# Patient Record
Sex: Male | Born: 1975
Health system: Southern US, Community
[De-identification: ages and names within clinical notes are randomized; demographics above are authoritative.]

## PROBLEM LIST (undated history)

## (undated) DIAGNOSIS — K760 Fatty (change of) liver, not elsewhere classified: Secondary | ICD-10-CM

## (undated) DIAGNOSIS — E785 Hyperlipidemia, unspecified: Secondary | ICD-10-CM

## (undated) DIAGNOSIS — G8929 Other chronic pain: Secondary | ICD-10-CM

## (undated) DIAGNOSIS — R7303 Prediabetes: Secondary | ICD-10-CM

## (undated) DIAGNOSIS — R079 Chest pain, unspecified: Secondary | ICD-10-CM

## (undated) DIAGNOSIS — M25569 Pain in unspecified knee: Secondary | ICD-10-CM

## (undated) DIAGNOSIS — M2550 Pain in unspecified joint: Secondary | ICD-10-CM

## (undated) DIAGNOSIS — R748 Abnormal levels of other serum enzymes: Secondary | ICD-10-CM

## (undated) DIAGNOSIS — M549 Dorsalgia, unspecified: Secondary | ICD-10-CM

## (undated) DIAGNOSIS — R0600 Dyspnea, unspecified: Secondary | ICD-10-CM

## (undated) DIAGNOSIS — I10 Essential (primary) hypertension: Secondary | ICD-10-CM

## (undated) DIAGNOSIS — F329 Major depressive disorder, single episode, unspecified: Secondary | ICD-10-CM

## (undated) DIAGNOSIS — M1990 Unspecified osteoarthritis, unspecified site: Secondary | ICD-10-CM

## (undated) DIAGNOSIS — F32A Depression, unspecified: Secondary | ICD-10-CM

## (undated) DIAGNOSIS — E559 Vitamin D deficiency, unspecified: Secondary | ICD-10-CM

## (undated) DIAGNOSIS — R600 Localized edema: Secondary | ICD-10-CM

## (undated) HISTORY — PX: KNEE ARTHROSCOPY: SUR90

## (undated) HISTORY — DX: Depression, unspecified: F32.A

## (undated) HISTORY — DX: Dorsalgia, unspecified: M54.9

## (undated) HISTORY — DX: Abnormal levels of other serum enzymes: R74.8

## (undated) HISTORY — DX: Pain in unspecified joint: M25.50

## (undated) HISTORY — DX: Localized edema: R60.0

## (undated) HISTORY — DX: Prediabetes: R73.03

## (undated) HISTORY — DX: Vitamin D deficiency, unspecified: E55.9

## (undated) HISTORY — DX: Dyspnea, unspecified: R06.00

## (undated) HISTORY — DX: Hyperlipidemia, unspecified: E78.5

## (undated) HISTORY — DX: Major depressive disorder, single episode, unspecified: F32.9

## (undated) HISTORY — DX: Essential (primary) hypertension: I10

## (undated) HISTORY — DX: Chest pain, unspecified: R07.9

## (undated) HISTORY — PX: VASECTOMY: SHX75

## (undated) HISTORY — DX: Pain in unspecified knee: M25.569

## (undated) HISTORY — DX: Other chronic pain: G89.29

## (undated) HISTORY — DX: Unspecified osteoarthritis, unspecified site: M19.90

## (undated) HISTORY — DX: Fatty (change of) liver, not elsewhere classified: K76.0

---

## 2013-07-25 ENCOUNTER — Emergency Department
Admission: EM | Admit: 2013-07-25 | Discharge: 2013-07-25 | Disposition: A | Discharge status: Home / Self Care | Payer: BC Managed Care – PPO | Source: Home / Self Care | Attending: Family Medicine | Admitting: Family Medicine

## 2013-07-25 ENCOUNTER — Encounter: Payer: Self-pay | Admitting: Emergency Medicine

## 2013-07-25 DIAGNOSIS — J209 Acute bronchitis, unspecified: Secondary | ICD-10-CM

## 2013-07-25 MED ORDER — PREDNISONE 20 MG PO TABS: 20.0000 mg | ORAL_TABLET | Freq: Two times a day (BID) | ORAL | Status: DC

## 2013-07-25 MED ORDER — AZITHROMYCIN 250 MG PO TABS: ORAL_TABLET | ORAL | Status: DC

## 2013-07-25 MED ORDER — BENZONATATE 200 MG PO CAPS: 200.0000 mg | ORAL_CAPSULE | Freq: Every day | ORAL | Status: DC

## 2013-07-25 NOTE — ED Notes (Signed)
Fever, chills, body aches, cough, congestion, sneezing x 3 days

## 2013-07-25 NOTE — Discharge Instructions (Signed)
Take plain Mucinex (1200 mg guaifenesin) twice daily for cough and congestion.  May add Sudafed for sinus congestion.   Increase fluid intake, rest. May use Afrin nasal spray (or generic oxymetazoline) twice daily for about 5 days.  Also recommend using saline nasal spray several times daily and saline nasal irrigation (AYR is a common brand) Try warm salt water gargles for sore throat.  Stop all antihistamines for now, and other non-prescription cough/cold preparations. May take Ibuprofen 200mg , 4 tabs every 8 hours with food for fever, body aches, etc.   Follow-up with family doctor if not improving 7 to 10 days.

## 2013-07-25 NOTE — ED Provider Notes (Signed)
CSN: [id number]     Arrival date & time 07/25/13  1100 History   First MD Initiated Contact with Patient 07/25/13 1211     Chief Complaint  Patient presents with  . URI      HPI Comments: 48 hours ago patient developed fatigue, sinus congestion, scratchy throat, and fever to 102 with chills.  Yesterday he had myalgias and developed a non-productive cough with wheezing.  His fever remained at 100.  He had one episode of diarrhea.  The history is provided by the patient.    History reviewed. No pertinent past medical history. Past Surgical History  Procedure Laterality Date  . Vasectomy     Family History  Problem Relation Age of Onset  . Stroke Father    History  Substance Use Topics  . Smoking status: Current Every Day Smoker -- 1.00 packs/day for 25 years    Types: Cigarettes  . Smokeless tobacco: Not on file  . Alcohol Use: Yes    Review of Systems ? sore throat + cough No pleuritic pain + wheezing + nasal congestion + post-nasal drainage No sinus pain/pressure No itchy/red eyes No earache No hemoptysis + SOB + fever, + chills No nausea No vomiting No abdominal pain +diarrhea, resolved No urinary symptoms No skin rash + fatigue + myalgias + headache Used OTC meds without relief  Allergies  Penicillins  Home Medications   Current Outpatient Rx  Name  Route  Sig  Dispense  Refill  . guaiFENesin (MUCINEX) 600 MG 12 hr tablet   Oral   Take by mouth 2 (two) times daily.         . Pseudoeph-Doxylamine-DM-APAP (NYQUIL PO)   Oral   Take by mouth.         Marland Kitchen. azithromycin (ZITHROMAX Z-PAK) 250 MG tablet      Take 2 tabs today; then begin one tab once daily for 4 more days.   6 each   0   . benzonatate (TESSALON) 200 MG capsule   Oral   Take 1 capsule (200 mg total) by mouth at bedtime. Take as needed for cough   12 capsule   0   . predniSONE (DELTASONE) 20 MG tablet   Oral   Take 1 tablet (20 mg total) by mouth 2 (two) times daily. Take with  food.   10 tablet   0    BP 115/78  Pulse 84  Temp(Src) 98.3 F (36.8 C) (Oral)  Ht 5\' 8"  (1.727 m)  Wt 274 lb (124.286 kg)  BMI 41.67 kg/m2  SpO2 96% Physical Exam Nursing notes and Vital Signs reviewed. Appearance:  Patient appears stated age, and in no acute distress.  Patient is obese (BMI 41.7) Eyes:  Pupils are equal, round, and reactive to light and accomodation.  Extraocular movement is intact.  Conjunctivae are not inflamed  Ears:  Canals normal.  Tympanic membranes normal.  Nose:  Congested turbinates.  No sinus tenderness.    Pharynx:  Normal Neck:  Supple.  Tender enlarged posterior nodes are palpated bilaterally  Lungs:  Bilateral faint wheezes present.  Breath sounds are equal.  Heart:  Regular rate and rhythm without murmurs, rubs, or gallops.  Abdomen:  Nontender without masses or hepatosplenomegaly.  Bowel sounds are present.  No CVA or flank tenderness.  Extremities:  No edema.  No calf tenderness Skin:  No rash present.   ED Course  Procedures  none      MDM   1. Acute bronchitis  Begin prednisone burst and Z-pack.  Prescription written for Benzonatate [facility]) to take at bedtime for night-time cough.  Take plain Mucinex (1200 mg guaifenesin) twice daily for cough and congestion.  May add Sudafed for sinus congestion.   Increase fluid intake, rest. May use Afrin nasal spray (or generic oxymetazoline) twice daily for about 5 days.  Also recommend using saline nasal spray several times daily and saline nasal irrigation (AYR is a common brand) Try warm salt water gargles for sore throat.  Stop all antihistamines for now, and other non-prescription cough/cold preparations. May take Ibuprofen 200mg , 4 tabs every 8 hours with food for fever, body aches, etc.   Follow-up with family doctor if not improving 7 to 10 days.     Lattie Haw, MD 07/26/13 [phone]

## 2016-02-11 ENCOUNTER — Telehealth: Payer: Self-pay | Admitting: Family Medicine

## 2016-02-11 NOTE — Telephone Encounter (Signed)
Can see him as acute

## 2016-02-11 NOTE — Telephone Encounter (Signed)
Please advise      KP 

## 2016-02-11 NOTE — Telephone Encounter (Signed)
Caller name: Chrissie NoaWilliam Relationship to patient: self Can be reached: [phone]  Reason for call: Pt wife is current pt with Dr. Laury AxonLowne (April Earlene Plateravis 04/07/72). Pt not feeling well, face red, has BP issues, pt has severe arthritis. Used to go to the [facility]. Pt wanting to know if he can be seen ASAP if accepted as pt. Please advise.

## 2016-02-14 ENCOUNTER — Ambulatory Visit (INDEPENDENT_AMBULATORY_CARE_PROVIDER_SITE_OTHER): Payer: BLUE CROSS/BLUE SHIELD | Admitting: Family Medicine

## 2016-02-14 ENCOUNTER — Encounter: Payer: Self-pay | Admitting: Family Medicine

## 2016-02-14 VITALS — BP 142/88 | HR 78 | Temp 98.6°F | Resp 16 | Ht 68.0 in | Wt 276.6 lb

## 2016-02-14 DIAGNOSIS — R079 Chest pain, unspecified: Secondary | ICD-10-CM | POA: Diagnosis not present

## 2016-02-14 DIAGNOSIS — I10 Essential (primary) hypertension: Secondary | ICD-10-CM

## 2016-02-14 DIAGNOSIS — M25561 Pain in right knee: Secondary | ICD-10-CM

## 2016-02-14 DIAGNOSIS — M25562 Pain in left knee: Secondary | ICD-10-CM

## 2016-02-14 DIAGNOSIS — G8929 Other chronic pain: Secondary | ICD-10-CM

## 2016-02-14 MED ORDER — LISINOPRIL 10 MG PO TABS: 10.0000 mg | ORAL_TABLET | Freq: Every day | ORAL | 2 refills | Status: DC

## 2016-02-14 MED ORDER — HYDROCODONE-ACETAMINOPHEN 5-325 MG PO TABS: 1.0000 | ORAL_TABLET | Freq: Four times a day (QID) | ORAL | 0 refills | Status: DC | PRN

## 2016-02-14 NOTE — Progress Notes (Signed)
Pre visit review using our clinic review tool, if applicable. No additional management support is needed unless otherwise documented below in the visit note. 

## 2016-02-14 NOTE — Patient Instructions (Signed)
DASH Eating Plan  DASH stands for "Dietary Approaches to Stop Hypertension." The DASH eating plan is a healthy eating plan that has been shown to reduce high blood pressure (hypertension). Additional health benefits may include reducing the risk of type 2 diabetes mellitus, heart disease, and stroke. The DASH eating plan may also help with weight loss.  WHAT DO I NEED TO KNOW ABOUT THE DASH EATING PLAN?  For the DASH eating plan, you will follow these general guidelines:  · Choose foods with a percent daily value for sodium of less than 5% (as listed on the food label).  · Use salt-free seasonings or herbs instead of table salt or sea salt.  · Check with your health care provider or pharmacist before using salt substitutes.  · Eat lower-sodium products, often labeled as "lower sodium" or "no salt added."  · Eat fresh foods.  · Eat more vegetables, fruits, and low-fat dairy products.  · Choose whole grains. Look for the word "whole" as the first word in the ingredient list.  · Choose fish and skinless chicken or turkey more often than red meat. Limit fish, poultry, and meat to 6 oz (170 g) each day.  · Limit sweets, desserts, sugars, and sugary drinks.  · Choose heart-healthy fats.  · Limit cheese to 1 oz (28 g) per day.  · Eat more home-cooked food and less restaurant, buffet, and fast food.  · Limit fried foods.  · Cook foods using methods other than frying.  · Limit canned vegetables. If you do use them, rinse them well to decrease the sodium.  · When eating at a restaurant, ask that your food be prepared with less salt, or no salt if possible.  WHAT FOODS CAN I EAT?  Seek help from a dietitian for individual calorie needs.  Grains  Whole grain or whole wheat bread. Brown rice. Whole grain or whole wheat pasta. Quinoa, bulgur, and whole grain cereals. Low-sodium cereals. Corn or whole wheat flour tortillas. Whole grain cornbread. Whole grain crackers. Low-sodium crackers.  Vegetables  Fresh or frozen vegetables  (raw, steamed, roasted, or grilled). Low-sodium or reduced-sodium tomato and vegetable juices. Low-sodium or reduced-sodium tomato sauce and paste. Low-sodium or reduced-sodium canned vegetables.   Fruits  All fresh, canned (in natural juice), or frozen fruits.  Meat and Other Protein Products  Ground beef (85% or leaner), grass-fed beef, or beef trimmed of fat. Skinless chicken or turkey. Ground chicken or turkey. Pork trimmed of fat. All fish and seafood. Eggs. Dried beans, peas, or lentils. Unsalted nuts and seeds. Unsalted canned beans.  Dairy  Low-fat dairy products, such as skim or 1% milk, 2% or reduced-fat cheeses, low-fat ricotta or cottage cheese, or plain low-fat yogurt. Low-sodium or reduced-sodium cheeses.  Fats and Oils  Tub margarines without trans fats. Light or reduced-fat mayonnaise and salad dressings (reduced sodium). Avocado. Safflower, olive, or canola oils. Natural peanut or almond butter.  Other  Unsalted popcorn and pretzels.  The items listed above may not be a complete list of recommended foods or beverages. Contact your dietitian for more options.  WHAT FOODS ARE NOT RECOMMENDED?  Grains  White bread. White pasta. White rice. Refined cornbread. Bagels and croissants. Crackers that contain trans fat.  Vegetables  Creamed or fried vegetables. Vegetables in a cheese sauce. Regular canned vegetables. Regular canned tomato sauce and paste. Regular tomato and vegetable juices.  Fruits  Dried fruits. Canned fruit in light or heavy syrup. Fruit juice.  Meat and Other Protein   Products  Fatty cuts of meat. Ribs, chicken wings, bacon, sausage, bologna, salami, chitterlings, fatback, hot dogs, bratwurst, and packaged luncheon meats. Salted nuts and seeds. Canned beans with salt.  Dairy  Whole or 2% milk, cream, half-and-half, and cream cheese. Whole-fat or sweetened yogurt. Full-fat cheeses or blue cheese. Nondairy creamers and whipped toppings. Processed cheese, cheese spreads, or cheese  curds.  Condiments  Onion and garlic salt, seasoned salt, table salt, and sea salt. Canned and packaged gravies. Worcestershire sauce. Tartar sauce. Barbecue sauce. Teriyaki sauce. Soy sauce, including reduced sodium. Steak sauce. Fish sauce. Oyster sauce. Cocktail sauce. Horseradish. Ketchup and mustard. Meat flavorings and tenderizers. Bouillon cubes. Hot sauce. Tabasco sauce. Marinades. Taco seasonings. Relishes.  Fats and Oils  Butter, stick margarine, lard, shortening, ghee, and bacon fat. Coconut, palm kernel, or palm oils. Regular salad dressings.  Other  Pickles and olives. Salted popcorn and pretzels.  The items listed above may not be a complete list of foods and beverages to avoid. Contact your dietitian for more information.  WHERE CAN I FIND MORE INFORMATION?  National Heart, Lung, and [employer] Institute: www.nhlbi.nih.gov/health/health-topics/topics/dash/     This information is not intended to replace advice given to you by your health care provider. Make sure you discuss any questions you have with your health care provider.     Document Released: 03/27/2011 Document Revised: 04/28/2014 Document Reviewed: 02/09/2013  Elsevier Interactive Patient Education ©2016 [employer]vier Inc[employer]

## 2016-02-14 NOTE — Progress Notes (Signed)
Patient ID: Jonathan BodoWilliam Muston, male    DOB: December 12, 1975  Age: 40 y.o. MRN: [id number]    Subjective:  Subjective  HPI Jonathan Hansen presents to establish and c/o bp and b/l knee pain.   Pt had surgery R knee Nov 2015 ortho [facility].   Pt still has aching-- and L knee is the worse one.  He can not stand for more than a few minutes in a store.  Pt had been taking hydrocodone for pain.    Pt bp has had borderline high for years and never been treated.    Review of Systems  Constitutional: Negative for appetite change, diaphoresis, fatigue and unexpected weight change.  Eyes: Negative for pain, redness and visual disturbance.  Respiratory: Negative for cough, chest tightness, shortness of breath and wheezing.   Cardiovascular: Negative for chest pain, palpitations and leg swelling.  Endocrine: Negative for cold intolerance, heat intolerance, polydipsia, polyphagia and polyuria.  Genitourinary: Negative for difficulty urinating, dysuria and frequency.  Musculoskeletal: Positive for arthralgias and joint swelling.  Neurological: Positive for headaches. Negative for dizziness, light-headedness and numbness.    History Past Medical History:  Diagnosis Date  . HTN (hypertension)     He has a past surgical history that includes Vasectomy.   His family history includes Coronary artery disease in his father; Stroke in his father.He reports that he has been smoking Cigarettes.  He has a 25.00 pack-year smoking history. He does not have any smokeless tobacco history on file. He reports that he drinks alcohol. His drug history is not on file.  No current outpatient prescriptions on file prior to visit.   No current facility-administered medications on file prior to visit.      Objective:  Objective  Physical Exam  Constitutional: He is oriented to person, place, and time. Vital signs are normal. He appears well-developed and well-nourished. He is sleeping.  HENT:  Head: Normocephalic and  atraumatic.  Mouth/Throat: Oropharynx is clear and moist.  Eyes: EOM are normal. Pupils are equal, round, and reactive to light.  Neck: Normal range of motion. Neck supple. No thyromegaly present.  Cardiovascular: Normal rate and regular rhythm.   No murmur heard. Pulmonary/Chest: Effort normal and breath sounds normal. No respiratory distress. He has no wheezes. He has no rales. He exhibits no tenderness.  Musculoskeletal: He exhibits no edema or tenderness.  Neurological: He is alert and oriented to person, place, and time.  Skin: Skin is warm and dry.  Psychiatric: He has a normal mood and affect. His behavior is normal. Judgment and thought content normal.  Nursing note and vitals reviewed.  BP (!) 142/88 (BP Location: Left Arm, Patient Position: Sitting, Cuff Size: Large)   Pulse 78   Temp 98.6 F (37 C) (Oral)   Resp 16   Ht 5\' 8"  (1.727 m)   Wt 276 lb 9.6 oz (125.5 kg)   SpO2 97%   BMI 42.06 kg/m  Wt Readings from Last 3 Encounters:  02/14/16 276 lb 9.6 oz (125.5 kg)  07/25/13 274 lb (124.3 kg)     No results found for: WBC, HGB, HCT, PLT, GLUCOSE, CHOL, TRIG, HDL, LDLDIRECT, LDLCALC, ALT, AST, NA, K, CL, CREATININE, BUN, CO2, TSH, PSA, INR, GLUF, HGBA1C, MICROALBUR  No results found.   Assessment & Plan:  Plan  I have discontinued Mr. Thayer JewBurdette's Pseudoeph-Doxylamine-DM-APAP (NYQUIL PO), guaiFENesin, predniSONE, azithromycin, and benzonatate. I am also having him start on lisinopril and HYDROcodone-acetaminophen.  Meds ordered this encounter  Medications  . lisinopril (PRINIVIL,ZESTRIL)  10 MG tablet    Sig: Take 1 tablet (10 mg total) by mouth daily.    Dispense:  30 tablet    Refill:  2  . HYDROcodone-acetaminophen (NORCO/VICODIN) 5-325 MG tablet    Sig: Take 1 tablet by mouth every 6 (six) hours as needed for moderate pain.    Dispense:  30 tablet    Refill:  0    Problem List Items Addressed This Visit      Unprioritized   Chest pain   Relevant Orders    EKG 12-Lead (Completed)   ECHOCARDIOGRAM COMPLETE   Chronic pain of both knees   Relevant Medications   HYDROcodone-acetaminophen (NORCO/VICODIN) 5-325 MG tablet   Other Relevant Orders   Ambulatory referral to Orthopedic Surgery   Essential hypertension - Primary   Relevant Medications   lisinopril (PRINIVIL,ZESTRIL) 10 MG tablet   Other Relevant Orders   EKG 12-Lead (Completed)    Other Visit Diagnoses   None.   if chest pain returns-- go to er  Follow-up: Return in about 2 weeks (around 02/28/2016) for hypertension.  Donato Schultz, DO

## 2016-02-17 DIAGNOSIS — I10 Essential (primary) hypertension: Secondary | ICD-10-CM | POA: Insufficient documentation

## 2016-02-17 DIAGNOSIS — M25561 Pain in right knee: Secondary | ICD-10-CM

## 2016-02-17 DIAGNOSIS — M25562 Pain in left knee: Secondary | ICD-10-CM

## 2016-02-17 DIAGNOSIS — R079 Chest pain, unspecified: Secondary | ICD-10-CM | POA: Insufficient documentation

## 2016-02-17 DIAGNOSIS — G8929 Other chronic pain: Secondary | ICD-10-CM | POA: Insufficient documentation

## 2016-03-04 ENCOUNTER — Ambulatory Visit (INDEPENDENT_AMBULATORY_CARE_PROVIDER_SITE_OTHER): Payer: BLUE CROSS/BLUE SHIELD | Admitting: Family Medicine

## 2016-03-04 ENCOUNTER — Encounter: Payer: Self-pay | Admitting: Family Medicine

## 2016-03-04 VITALS — BP 120/82 | HR 75 | Temp 98.0°F | Resp 16 | Ht 68.0 in | Wt 273.0 lb

## 2016-03-04 DIAGNOSIS — I10 Essential (primary) hypertension: Secondary | ICD-10-CM

## 2016-03-04 DIAGNOSIS — G8929 Other chronic pain: Secondary | ICD-10-CM

## 2016-03-04 DIAGNOSIS — M25562 Pain in left knee: Secondary | ICD-10-CM

## 2016-03-04 DIAGNOSIS — R0789 Other chest pain: Secondary | ICD-10-CM | POA: Diagnosis not present

## 2016-03-04 DIAGNOSIS — M25561 Pain in right knee: Secondary | ICD-10-CM | POA: Diagnosis not present

## 2016-03-04 MED ORDER — HYDROCODONE-ACETAMINOPHEN 5-325 MG PO TABS: 1.0000 | ORAL_TABLET | Freq: Four times a day (QID) | ORAL | 0 refills | Status: DC | PRN

## 2016-03-04 MED ORDER — LISINOPRIL 10 MG PO TABS: 10.0000 mg | ORAL_TABLET | Freq: Every day | ORAL | 1 refills | Status: DC

## 2016-03-04 NOTE — Progress Notes (Signed)
Pre visit review using our clinic review tool, if applicable. No additional management support is needed unless otherwise documented below in the visit note. 

## 2016-03-04 NOTE — Progress Notes (Signed)
Patient ID: Jonathan Hansen, male    DOB: [birth year]  Age: 40 y.o. MRN: [id number]    Subjective:  Subjective  HPI Jonathan Hansen presents for bp f/u.  Doing well but still having same chest pinching feeling ---  No sob, no palpatations.    Review of Systems  Constitutional: Negative for appetite change, diaphoresis, fatigue and unexpected weight change.  Eyes: Negative for pain, redness and visual disturbance.  Respiratory: Negative for cough, chest tightness, shortness of breath and wheezing.   Cardiovascular: Positive for chest pain. Negative for palpitations and leg swelling.  Endocrine: Negative for cold intolerance, heat intolerance, polydipsia, polyphagia and polyuria.  Genitourinary: Negative for difficulty urinating, dysuria and frequency.  Neurological: Negative for dizziness, light-headedness, numbness and headaches.    History Past Medical History:  Diagnosis Date  . HTN (hypertension)     He has a past surgical history that includes Vasectomy.   His family history includes Coronary artery disease in his father; Stroke in his father.He reports that he has been smoking Cigarettes.  He has a 25.00 pack-year smoking history. He does not have any smokeless tobacco history on file. He reports that he drinks alcohol. His drug history is not on file.  No current outpatient prescriptions on file prior to visit.   No current facility-administered medications on file prior to visit.      Objective:  Objective  Physical Exam  Constitutional: He is oriented to person, place, and time. Vital signs are normal. He appears well-developed and well-nourished. He is sleeping.  HENT:  Head: Normocephalic and atraumatic.  Mouth/Throat: Oropharynx is clear and moist.  Eyes: EOM are normal. Pupils are equal, round, and reactive to light.  Neck: Normal range of motion. Neck supple. No thyromegaly present.  Cardiovascular: Normal rate and regular rhythm.   No murmur  heard. Pulmonary/Chest: Effort normal and breath sounds normal. No respiratory distress. He has no wheezes. He has no rales. He exhibits no tenderness.  Musculoskeletal: He exhibits no edema or tenderness.  Neurological: He is alert and oriented to person, place, and time.  Skin: Skin is warm and dry.  Psychiatric: He has a normal mood and affect. His behavior is normal. Judgment and thought content normal.  Nursing note and vitals reviewed.  BP 120/82 (BP Location: Right Arm, Patient Position: Sitting, Cuff Size: Large)   Pulse 75   Temp 98 F (36.7 C) (Oral)   Resp 16   Ht 5\' 8"  (1.727 m)   Wt 273 lb (123.8 kg)   SpO2 97%   BMI 41.51 kg/m  Wt Readings from Last 3 Encounters:  03/04/16 273 lb (123.8 kg)  02/14/16 276 lb 9.6 oz (125.5 kg)  07/25/13 274 lb (124.3 kg)     No results found for: WBC, HGB, HCT, PLT, GLUCOSE, CHOL, TRIG, HDL, LDLDIRECT, LDLCALC, ALT, AST, NA, K, CL, CREATININE, BUN, CO2, TSH, PSA, INR, GLUF, HGBA1C, MICROALBUR  No results found.   Assessment & Plan:  Plan  I am having Jonathan Hansen maintain his lisinopril and HYDROcodone-acetaminophen.  Meds ordered this encounter  Medications  . lisinopril (PRINIVIL,ZESTRIL) 10 MG tablet    Sig: Take 1 tablet (10 mg total) by mouth daily.    Dispense:  90 tablet    Refill:  1  . HYDROcodone-acetaminophen (NORCO/VICODIN) 5-325 MG tablet    Sig: Take 1 tablet by mouth every 6 (six) hours as needed for moderate pain.    Dispense:  30 tablet    Refill:  0  Problem List Items Addressed This Visit      Unprioritized   Chronic pain of both knees   Relevant Medications   HYDROcodone-acetaminophen (NORCO/VICODIN) 5-325 MG tablet   Chest pain - Primary    Comes and goes Echo pending Stress test Pt admits to being under stress If pain worsens ---go to ER       Relevant Orders   Myocardial Perfusion Imaging   Essential hypertension    Stable con't lisinopril      Relevant Medications   lisinopril  (PRINIVIL,ZESTRIL) 10 MG tablet      Follow-up: No Follow-up on file.  Donato SchultzYvonne R Lowne Chase, DO

## 2016-03-04 NOTE — Patient Instructions (Signed)

## 2016-03-05 ENCOUNTER — Telehealth: Payer: Self-pay | Admitting: General Practice

## 2016-03-05 ENCOUNTER — Ambulatory Visit (HOSPITAL_BASED_OUTPATIENT_CLINIC_OR_DEPARTMENT_OTHER)
Admission: RE | Admit: 2016-03-05 | Discharge: 2016-03-05 | Disposition: A | Discharge status: Home / Self Care | Payer: BLUE CROSS/BLUE SHIELD | Source: Ambulatory Visit | Attending: Family Medicine | Admitting: Family Medicine

## 2016-03-05 DIAGNOSIS — I517 Cardiomegaly: Secondary | ICD-10-CM | POA: Diagnosis not present

## 2016-03-05 DIAGNOSIS — R079 Chest pain, unspecified: Secondary | ICD-10-CM | POA: Insufficient documentation

## 2016-03-05 NOTE — Assessment & Plan Note (Signed)
Comes and goes Echo pending Stress test Pt admits to being under stress If pain worsens ---go to ER

## 2016-03-05 NOTE — Progress Notes (Signed)
  Echocardiogram 2D Echocardiogram has been performed.  Nolon RodBrown, Tony 03/05/2016, 10:48 AM

## 2016-03-05 NOTE — Telephone Encounter (Signed)
Patient called in for the results of his Echocardiogram results. Gave them to him. Patient says please let him know if there was anything else he needs to do.

## 2016-03-05 NOTE — Telephone Encounter (Signed)
Stress test was ordered by PCP and pre-certed by referral coordinators. Order was just placed yesterday evening. The pt should receive a call from scheduling department to schedule echo.

## 2016-03-05 NOTE — Telephone Encounter (Signed)
Pt says that at his last appt he was told that he would need a stress test. Pt says that he never heard any more about it. Please advise.

## 2016-03-05 NOTE — Assessment & Plan Note (Signed)
Stable con't lisinopril  

## 2016-03-06 ENCOUNTER — Encounter: Payer: Self-pay | Admitting: Family Medicine

## 2016-03-25 ENCOUNTER — Ambulatory Visit (INDEPENDENT_AMBULATORY_CARE_PROVIDER_SITE_OTHER): Payer: PRIVATE HEALTH INSURANCE | Admitting: Family Medicine

## 2016-03-25 ENCOUNTER — Encounter: Payer: Self-pay | Admitting: Family Medicine

## 2016-03-25 VITALS — BP 130/88 | HR 79 | Temp 98.2°F | Resp 17 | Ht 68.0 in | Wt 274.6 lb

## 2016-03-25 DIAGNOSIS — M25562 Pain in left knee: Secondary | ICD-10-CM

## 2016-03-25 DIAGNOSIS — R0789 Other chest pain: Secondary | ICD-10-CM | POA: Diagnosis not present

## 2016-03-25 DIAGNOSIS — Z8249 Family history of ischemic heart disease and other diseases of the circulatory system: Secondary | ICD-10-CM | POA: Diagnosis not present

## 2016-03-25 DIAGNOSIS — G8929 Other chronic pain: Secondary | ICD-10-CM

## 2016-03-25 DIAGNOSIS — I10 Essential (primary) hypertension: Secondary | ICD-10-CM

## 2016-03-25 DIAGNOSIS — M25561 Pain in right knee: Secondary | ICD-10-CM

## 2016-03-25 MED ORDER — HYDROCODONE-ACETAMINOPHEN 5-325 MG PO TABS: 1.0000 | ORAL_TABLET | Freq: Four times a day (QID) | ORAL | 0 refills | Status: DC | PRN

## 2016-03-25 MED ORDER — LISINOPRIL 20 MG PO TABS: 20.0000 mg | ORAL_TABLET | Freq: Every day | ORAL | 3 refills | Status: DC

## 2016-03-25 NOTE — Progress Notes (Signed)
Subjective:    Patient ID: Jonathan Hansen, male    DOB: 12-13-1975, 40 y.o.   MRN: [id number]  Chief Complaint  Patient presents with  . Hypertension    follow up    HPI Patient is in today for f/u bp.  Pt still c/o knee/ joint pain.  Ortho pending.    Past Medical History:  Diagnosis Date  . HTN (hypertension)     Past Surgical History:  Procedure Laterality Date  . VASECTOMY      Family History  Problem Relation Age of Onset  . Stroke Father   . Coronary artery disease Father     carotid stenosis    Social History   Social History  . Marital status: Married    Spouse name: N/A  . Number of children: N/A  . Years of education: N/A   Occupational History  . Not on file.   Social History Main Topics  . Smoking status: Current Every Day Smoker    Packs/day: 1.00    Years: 25.00    Types: Cigarettes  . Smokeless tobacco: Not on file  . Alcohol use Yes  . Drug use: Unknown  . Sexual activity: Not on file   Other Topics Concern  . Not on file   Social History Narrative  . No narrative on file    Outpatient Medications Prior to Visit  Medication Sig Dispense Refill  . HYDROcodone-acetaminophen (NORCO/VICODIN) 5-325 MG tablet Take 1 tablet by mouth every 6 (six) hours as needed for moderate pain. 30 tablet 0  . lisinopril (PRINIVIL,ZESTRIL) 10 MG tablet Take 1 tablet (10 mg total) by mouth daily. 90 tablet 1   No facility-administered medications prior to visit.     Allergies  Allergen Reactions  . Penicillins     Review of Systems  Constitutional: Negative for chills, fever and malaise/fatigue.  HENT: Negative for congestion and hearing loss.   Eyes: Negative for discharge.  Respiratory: Negative for cough, sputum production and shortness of breath.   Cardiovascular: Negative for chest pain, palpitations and leg swelling.  Gastrointestinal: Negative for abdominal pain, blood in stool, constipation, diarrhea, heartburn, nausea and vomiting.    Genitourinary: Negative for dysuria, frequency, hematuria and urgency.  Musculoskeletal: Positive for back pain and joint pain. Negative for falls and myalgias.  Skin: Negative for rash.  Neurological: Negative for dizziness, sensory change, loss of consciousness, weakness and headaches.  Endo/Heme/Allergies: Negative for environmental allergies. Does not bruise/bleed easily.  Psychiatric/Behavioral: Negative for depression and suicidal ideas. The patient is not nervous/anxious and does not have insomnia.        Objective:    Physical Exam  Constitutional: He is oriented to person, place, and time. Vital signs are normal. He appears well-developed and well-nourished. He is sleeping.  HENT:  Head: Normocephalic and atraumatic.  Mouth/Throat: Oropharynx is clear and moist.  Eyes: EOM are normal. Pupils are equal, round, and reactive to light.  Neck: Normal range of motion. Neck supple. No thyromegaly present.  Cardiovascular: Normal rate and regular rhythm.   No murmur heard. Pulmonary/Chest: Effort normal and breath sounds normal. No respiratory distress. He has no wheezes. He has no rales. He exhibits no tenderness.  Musculoskeletal: He exhibits no edema or tenderness.  Neurological: He is alert and oriented to person, place, and time.  Skin: Skin is warm and dry.  Psychiatric: He has a normal mood and affect. His behavior is normal. Judgment and thought content normal.  Nursing note and vitals reviewed.  BP 130/88 (BP Location: Right Arm, Patient Position: Sitting, Cuff Size: Large)   Pulse 79   Temp 98.2 F (36.8 C) (Oral)   Resp 17   Ht 5' 8" (1.727 m)   Wt 274 lb 9.6 oz (124.6 kg)   SpO2 100%   BMI 41.75 kg/m  Wt Readings from Last 3 Encounters:  03/25/16 274 lb 9.6 oz (124.6 kg)  03/04/16 273 lb (123.8 kg)  02/14/16 276 lb 9.6 oz (125.5 kg)     No results found for: WBC, HGB, HCT, PLT, GLUCOSE, CHOL, TRIG, HDL, LDLDIRECT, LDLCALC, ALT, AST, NA, K, CL, CREATININE,  BUN, CO2, TSH, PSA, INR, GLUF, HGBA1C, MICROALBUR  No results found for: TSH No results found for: WBC, HGB, HCT, MCV, PLT No results found for: NA, K, CHLORIDE, CO2, GLUCOSE, BUN, CREATININE, BILITOT, ALKPHOS, AST, ALT, PROT, ALBUMIN, CALCIUM, ANIONGAP, EGFR, GFR No results found for: CHOL No results found for: HDL No results found for: LDLCALC No results found for: TRIG No results found for: CHOLHDL No results found for: HGBA1C     Assessment & Plan:   Problem List Items Addressed This Visit      Unprioritized   Chest pain    Improved since bp has come down Stress test pending      Chronic pain of both knees    Refill pain meds F/u ortho      Relevant Medications   HYDROcodone-acetaminophen (NORCO/VICODIN) 5-325 MG tablet   Essential hypertension - Primary    Inc lisinopril to 20 mg daily Recheck 3 months      Relevant Medications   lisinopril (PRINIVIL,ZESTRIL) 20 MG tablet    Other Visit Diagnoses    Family history of early CAD          I have discontinued Mr. Sarff's lisinopril. I am also having him start on lisinopril. Additionally, I am having him maintain his HYDROcodone-acetaminophen.  Meds ordered this encounter  Medications  . lisinopril (PRINIVIL,ZESTRIL) 20 MG tablet    Sig: Take 1 tablet (20 mg total) by mouth daily.    Dispense:  90 tablet    Refill:  3  . HYDROcodone-acetaminophen (NORCO/VICODIN) 5-325 MG tablet    Sig: Take 1 tablet by mouth every 6 (six) hours as needed for moderate pain.    Dispense:  45 tablet    Refill:  0     Ann Held, DO

## 2016-03-25 NOTE — Progress Notes (Signed)
Pre visit review using our clinic review tool, if applicable. No additional management support is needed unless otherwise documented below in the visit note. 

## 2016-03-25 NOTE — Patient Instructions (Signed)
Hypertension Hypertension, commonly called high blood pressure, is when the force of blood pumping through your arteries is too strong. Your arteries are the blood vessels that carry blood from your heart throughout your body. A blood pressure reading consists of a higher number over a lower number, such as 110/72. The higher number (systolic) is the pressure inside your arteries when your heart pumps. The lower number (diastolic) is the pressure inside your arteries when your heart relaxes. Ideally you want your blood pressure below 120/80. Hypertension forces your heart to work harder to pump blood. Your arteries may become narrow or stiff. Having untreated or uncontrolled hypertension can cause heart attack, stroke, kidney disease, and other problems. What increases the risk? Some risk factors for high blood pressure are controllable. Others are not. Risk factors you cannot control include:  Race. You may be at higher risk if you are African American.  Age. Risk increases with age.  Gender. Men are at higher risk than women before age 45 years. After age 65, women are at higher risk than men. Risk factors you can control include:  Not getting enough exercise or physical activity.  Being overweight.  Getting too much fat, sugar, calories, or salt in your diet.  Drinking too much alcohol. What are the signs or symptoms? Hypertension does not usually cause signs or symptoms. Extremely high blood pressure (hypertensive crisis) may cause headache, anxiety, shortness of breath, and nosebleed. How is this diagnosed? To check if you have hypertension, your health care provider will measure your blood pressure while you are seated, with your arm held at the level of your heart. It should be measured at least twice using the same arm. Certain conditions can cause a difference in blood pressure between your right and left arms. A blood pressure reading that is higher than normal on one occasion does  not mean that you need treatment. If it is not clear whether you have high blood pressure, you may be asked to return on a different day to have your blood pressure checked again. Or, you may be asked to monitor your blood pressure at home for 1 or more weeks. How is this treated? Treating high blood pressure includes making lifestyle changes and possibly taking medicine. Living a healthy lifestyle can help lower high blood pressure. You may need to change some of your habits. Lifestyle changes may include:  Following the DASH diet. This diet is high in fruits, vegetables, and whole grains. It is low in salt, red meat, and added sugars.  Keep your sodium intake below 2,300 mg per day.  Getting at least 30-45 minutes of aerobic exercise at least 4 times per week.  Losing weight if necessary.  Not smoking.  Limiting alcoholic beverages.  Learning ways to reduce stress. Your health care provider may prescribe medicine if lifestyle changes are not enough to get your blood pressure under control, and if one of the following is true:  You are 18-59 years of age and your systolic blood pressure is above 140.  You are 60 years of age or older, and your systolic blood pressure is above 150.  Your diastolic blood pressure is above 90.  You have diabetes, and your systolic blood pressure is over 140 or your diastolic blood pressure is over 90.  You have kidney disease and your blood pressure is above 140/90.  You have heart disease and your blood pressure is above 140/90. Your personal target blood pressure may vary depending on your medical   conditions, your age, and other factors. Follow these instructions at home:  Have your blood pressure rechecked as directed by your health care provider.  Take medicines only as directed by your health care provider. Follow the directions carefully. Blood pressure medicines must be taken as prescribed. The medicine does not work as well when you skip  doses. Skipping doses also puts you at risk for problems.  Do not smoke.  Monitor your blood pressure at home as directed by your health care provider. Contact a health care provider if:  You think you are having a reaction to medicines taken.  You have recurrent headaches or feel dizzy.  You have swelling in your ankles.  You have trouble with your vision. Get help right away if:  You develop a severe headache or confusion.  You have unusual weakness, numbness, or feel faint.  You have severe chest or abdominal pain.  You vomit repeatedly.  You have trouble breathing. This information is not intended to replace advice given to you by your health care provider. Make sure you discuss any questions you have with your health care provider. Document Released: 04/07/2005 Document Revised: 09/13/2015 Document Reviewed: 01/28/2013 Elsevier Interactive Patient Education  2017 Elsevier Inc.  

## 2016-03-25 NOTE — Assessment & Plan Note (Signed)
Improved since bp has come down Stress test pending

## 2016-03-25 NOTE — Assessment & Plan Note (Signed)
Inc lisinopril to 20 mg daily Recheck 3 months

## 2016-03-25 NOTE — Assessment & Plan Note (Signed)
Refill pain meds F/u ortho  

## 2016-04-10 ENCOUNTER — Telehealth: Payer: Self-pay | Admitting: *Deleted

## 2016-04-10 NOTE — Telephone Encounter (Signed)
Drug screen results received and sent to scan.  

## 2016-04-28 ENCOUNTER — Telehealth: Payer: Self-pay | Admitting: Family Medicine

## 2016-04-28 DIAGNOSIS — G8929 Other chronic pain: Secondary | ICD-10-CM

## 2016-04-28 DIAGNOSIS — M25561 Pain in right knee: Principal | ICD-10-CM

## 2016-04-28 DIAGNOSIS — M25562 Pain in left knee: Principal | ICD-10-CM

## 2016-04-29 MED ORDER — HYDROCODONE-ACETAMINOPHEN 5-325 MG PO TABS: 1.0000 | ORAL_TABLET | Freq: Four times a day (QID) | ORAL | 0 refills | Status: DC | PRN

## 2016-04-29 NOTE — Telephone Encounter (Signed)
Pt informed via MyChart that Rx has been placed at front desk for pick up.  

## 2016-04-29 NOTE — Telephone Encounter (Signed)
Rx printed, awaiting DO signature.  

## 2016-04-29 NOTE — Telephone Encounter (Signed)
Pt is requesting refill on Hydrocodone.  Last OV: 03/25/2016 Last Fill: 03/25/2016 #45 and 0RF UDS: 03/04/2016 Low risk  Please advise.

## 2016-04-29 NOTE — Telephone Encounter (Signed)
Refill x1 

## 2016-05-02 NOTE — Telephone Encounter (Signed)
Insurance will only pay for a 7 day supply, and as per pharmacy Prior Auth is required for the remaining pills. PA form faxed to [phone] Attention Dr. Laury AxonLowne, please advise if fax received

## 2016-05-05 NOTE — Telephone Encounter (Signed)
Patient notified.  PA was approved through 11/02/16.  Ref number [id number]

## 2016-05-06 NOTE — Telephone Encounter (Signed)
Received PA Approval for Hydrocodone/APAP approved from 05/05/2016 through 11/02/2016.  Patient has received notification of approval as well.  ID# [id number]  - PPO [id number] MA Sent original to scan.

## 2016-06-04 ENCOUNTER — Other Ambulatory Visit: Payer: Self-pay | Admitting: Family Medicine

## 2016-06-04 DIAGNOSIS — G8929 Other chronic pain: Secondary | ICD-10-CM

## 2016-06-04 DIAGNOSIS — M25562 Pain in left knee: Principal | ICD-10-CM

## 2016-06-04 DIAGNOSIS — M25561 Pain in right knee: Principal | ICD-10-CM

## 2016-06-05 ENCOUNTER — Other Ambulatory Visit: Payer: PRIVATE HEALTH INSURANCE

## 2016-06-05 ENCOUNTER — Encounter: Payer: Self-pay | Admitting: Family Medicine

## 2016-06-05 MED ORDER — HYDROCODONE-ACETAMINOPHEN 5-325 MG PO TABS: 1.0000 | ORAL_TABLET | Freq: Four times a day (QID) | ORAL | 0 refills | Status: DC | PRN

## 2016-06-05 NOTE — Telephone Encounter (Signed)
Last refill on 04/29/2016 #45  0 refills Last office visit 03/25/2016----No upcoming appt. No UDS/No Contract

## 2016-06-05 NOTE — Telephone Encounter (Signed)
Called the patient informed hardcopy for hydrocodone is ready for pickup, Also explained to him to complete contract and UDS upon picking up hardcopu,

## 2016-06-06 ENCOUNTER — Encounter: Payer: Self-pay | Admitting: Family Medicine

## 2016-06-30 ENCOUNTER — Encounter: Payer: Self-pay | Admitting: Family Medicine

## 2016-06-30 ENCOUNTER — Other Ambulatory Visit: Payer: Self-pay | Admitting: Family Medicine

## 2016-06-30 DIAGNOSIS — G8929 Other chronic pain: Secondary | ICD-10-CM

## 2016-06-30 DIAGNOSIS — M25562 Pain in left knee: Principal | ICD-10-CM

## 2016-06-30 DIAGNOSIS — M25561 Pain in right knee: Principal | ICD-10-CM

## 2016-07-01 ENCOUNTER — Encounter: Payer: Self-pay | Admitting: Family Medicine

## 2016-07-01 MED ORDER — HYDROCODONE-ACETAMINOPHEN 5-325 MG PO TABS: 1.0000 | ORAL_TABLET | Freq: Four times a day (QID) | ORAL | 0 refills | Status: DC | PRN

## 2016-07-01 NOTE — Telephone Encounter (Signed)
Last office visit on12/08/2015 Last refill on 06/05/16 Contract on 06/05/2016 No UDS--no upcoming appt.

## 2016-07-01 NOTE — Telephone Encounter (Signed)
PCP signed prescription. Printed contract Patient contacted to complete contract/do UDS/

## 2016-07-01 NOTE — Telephone Encounter (Signed)
Would need ov for bp checkup before bp med stopped

## 2016-07-01 NOTE — Telephone Encounter (Signed)
Need uds and contract Ok to refill x1 Indication for chronic opioid: chronic pain Medication and dose: norco 5/325 # pills per month: 45 Last UDS date: due Pain contract signed (Y/N): due Date narcotic database last reviewed (include red flags): 07/01/2016

## 2016-07-03 ENCOUNTER — Encounter: Payer: Self-pay | Admitting: Family Medicine

## 2016-07-30 ENCOUNTER — Other Ambulatory Visit: Payer: Self-pay | Admitting: Family Medicine

## 2016-07-30 DIAGNOSIS — M25561 Pain in right knee: Principal | ICD-10-CM

## 2016-07-30 DIAGNOSIS — G8929 Other chronic pain: Secondary | ICD-10-CM

## 2016-07-30 DIAGNOSIS — M25562 Pain in left knee: Principal | ICD-10-CM

## 2016-07-31 ENCOUNTER — Other Ambulatory Visit: Payer: Self-pay | Admitting: Family Medicine

## 2016-07-31 DIAGNOSIS — M25561 Pain in right knee: Principal | ICD-10-CM

## 2016-07-31 DIAGNOSIS — M25562 Pain in left knee: Principal | ICD-10-CM

## 2016-07-31 DIAGNOSIS — G8929 Other chronic pain: Secondary | ICD-10-CM

## 2016-08-01 MED ORDER — HYDROCODONE-ACETAMINOPHEN 5-325 MG PO TABS: 1.0000 | ORAL_TABLET | Freq: Four times a day (QID) | ORAL | 0 refills | Status: DC | PRN

## 2016-08-01 NOTE — Telephone Encounter (Signed)
Reviewed [place] Controlled substance data base for this medication and his fills are appropriate.

## 2016-08-01 NOTE — Telephone Encounter (Signed)
Patient checking on the status of medication refill patient would like to pick up today, please advise [phone]

## 2016-08-01 NOTE — Telephone Encounter (Signed)
Called cell/work number left detailed message on both numbers hardcopy for hydrocodone has been filled and is ready for pickup at the front desk.

## 2016-08-27 ENCOUNTER — Other Ambulatory Visit: Payer: Self-pay | Admitting: Family Medicine

## 2016-08-27 DIAGNOSIS — M25562 Pain in left knee: Principal | ICD-10-CM

## 2016-08-27 DIAGNOSIS — M25561 Pain in right knee: Principal | ICD-10-CM

## 2016-08-27 DIAGNOSIS — G8929 Other chronic pain: Secondary | ICD-10-CM

## 2016-08-27 NOTE — Telephone Encounter (Signed)
°  Relation to pt:self °Call back number:[phone] ° ° °Reason for call:  °Patient requesting a refill HYDROcodone-acetaminophen (NORCO/VICODIN) 5-325 MG tablet  °

## 2016-08-28 MED ORDER — HYDROCODONE-ACETAMINOPHEN 5-325 MG PO TABS: 1.0000 | ORAL_TABLET | Freq: Four times a day (QID) | ORAL | 0 refills | Status: DC | PRN

## 2016-08-28 NOTE — Telephone Encounter (Signed)
Requesting:   Hydrocodone Contract    07/01/2016 UDS    Low risk next due on 01/01/2017 Last OV     03/25/16 Last Refill    #45 no refills on 08/01/2016  Please Advise

## 2016-08-28 NOTE — Telephone Encounter (Signed)
Printed/pcp signed and called the  Patient informed to pickup hardcopy (left detailed message)

## 2016-08-28 NOTE — Telephone Encounter (Signed)
Refill x 1 Indication for chronic opioid: chronic pain Medication and dose: norco 5/325 # pills per month: 45 Last UDS date: 01/02/2016 Pain contract signed (Y/N): 07/01/2016 Date narcotic database last reviewed (include red flags): 08/28/2016

## 2016-09-29 ENCOUNTER — Other Ambulatory Visit: Payer: Self-pay | Admitting: Family Medicine

## 2016-09-29 ENCOUNTER — Encounter: Payer: Self-pay | Admitting: Family Medicine

## 2016-09-29 DIAGNOSIS — M25562 Pain in left knee: Principal | ICD-10-CM

## 2016-09-29 DIAGNOSIS — M25561 Pain in right knee: Principal | ICD-10-CM

## 2016-09-29 DIAGNOSIS — G8929 Other chronic pain: Secondary | ICD-10-CM

## 2016-09-29 MED ORDER — HYDROCODONE-ACETAMINOPHEN 5-325 MG PO TABS: 1.0000 | ORAL_TABLET | Freq: Four times a day (QID) | ORAL | 0 refills | Status: DC | PRN

## 2016-10-27 ENCOUNTER — Other Ambulatory Visit: Payer: Self-pay | Admitting: Family Medicine

## 2016-10-27 ENCOUNTER — Encounter: Payer: Self-pay | Admitting: Family Medicine

## 2016-10-27 ENCOUNTER — Telehealth: Payer: Self-pay | Admitting: Family Medicine

## 2016-10-27 DIAGNOSIS — G8929 Other chronic pain: Secondary | ICD-10-CM

## 2016-10-27 DIAGNOSIS — M25562 Pain in left knee: Principal | ICD-10-CM

## 2016-10-27 DIAGNOSIS — M25561 Pain in right knee: Principal | ICD-10-CM

## 2016-10-27 NOTE — Telephone Encounter (Signed)
database 

## 2016-10-27 NOTE — Telephone Encounter (Signed)
Requesting:   hydrocodone Contract    07/01/2016 UDS    Low risk next is due on 01/01/2017 Last OV     03/25/2016 Last Refill   #45 on 09/29/2016  Please Advise

## 2016-10-27 NOTE — Telephone Encounter (Signed)
Relation to ZO:XWRUpt:self Call back number:[phone]   Reason for call:  Patient requesting HYDROcodone-acetaminophen (NORCO/VICODIN) 5-325 MG tablet

## 2016-10-28 NOTE — Telephone Encounter (Signed)
Patient scheduled follow up for  11/04/16 with pcp.

## 2016-10-28 NOTE — Telephone Encounter (Signed)
He is overdue for ov He was also supposed to be seeing ortho  Refill x1 but we will need to discuss because of new laws we will not be able to continually write pain meds

## 2016-10-28 NOTE — Telephone Encounter (Signed)
Database ran and on your desk  

## 2016-10-30 MED ORDER — HYDROCODONE-ACETAMINOPHEN 5-325 MG PO TABS: 1.0000 | ORAL_TABLET | Freq: Four times a day (QID) | ORAL | 0 refills | Status: DC | PRN

## 2016-10-30 NOTE — Telephone Encounter (Signed)
Pt called in to follow up on refill request. Informed that he is ov office visit per PCP. Im also showing pt is scheduled to come in on 11/04/16 to be seen. Pt would like to come in and pick up on his lunch break if possible    Please advise.

## 2016-10-30 NOTE — Telephone Encounter (Signed)
priinted/PCP signed/called the patient had to leave a detailed message as did not answer his phone) informed to pickup hardcopy at convenience.

## 2016-11-04 ENCOUNTER — Telehealth: Payer: Self-pay | Admitting: Family Medicine

## 2016-11-04 ENCOUNTER — Ambulatory Visit: Payer: PRIVATE HEALTH INSURANCE | Admitting: Family Medicine

## 2016-11-04 NOTE — Telephone Encounter (Signed)
No charge. 

## 2016-11-04 NOTE — Telephone Encounter (Signed)
Pt called in at 5:36 to reschedule apt. He said that he is stuck at work. I rescheduled pt for next Tuesday.

## 2016-11-11 ENCOUNTER — Encounter: Payer: Self-pay | Admitting: Family Medicine

## 2016-11-11 ENCOUNTER — Ambulatory Visit (INDEPENDENT_AMBULATORY_CARE_PROVIDER_SITE_OTHER): Payer: 59 | Admitting: Family Medicine

## 2016-11-11 VITALS — BP 122/82 | HR 78 | Temp 98.0°F | Resp 16 | Ht 68.0 in | Wt 268.2 lb

## 2016-11-11 DIAGNOSIS — R0789 Other chest pain: Secondary | ICD-10-CM | POA: Diagnosis not present

## 2016-11-11 DIAGNOSIS — I10 Essential (primary) hypertension: Secondary | ICD-10-CM | POA: Diagnosis not present

## 2016-11-11 NOTE — Patient Instructions (Signed)

## 2016-11-11 NOTE — Progress Notes (Signed)
Patient ID: Jonathan Hansen, male   DOB: 03/28/1976, 41 y.o.   MRN: [id number]     Subjective:  I acted as a Neurosurgeonscribe for Dr. Zola ButtonLowne-Chase.  Apolonio SchneidersSheketia, CMA   Patient ID: Jonathan BodoWilliam Bencomo, male    DOB: 03/28/1976, 41 y.o.   MRN: [id number]  Chief Complaint  Patient presents with  . Hypertension    HPI  Patient is in today for follow up blood pressure.  He states that blood pressures have been running normal  Patient Care Team: Zola ButtonLowne Chase, Grayling CongressYvonne R, DO as PCP - General (Family Medicine)   Past Medical History:  Diagnosis Date  . HTN (hypertension)     Past Surgical History:  Procedure Laterality Date  . VASECTOMY      Family History  Problem Relation Age of Onset  . Stroke Father   . Coronary artery disease Father        carotid stenosis    Social History   Social History  . Marital status: Married    Spouse name: N/A  . Number of children: N/A  . Years of education: N/A   Occupational History  . Not on file.   Social History Main Topics  . Smoking status: Current Every Day Smoker    Packs/day: 1.00    Years: 25.00    Types: Cigarettes  . Smokeless tobacco: Never Used  . Alcohol use Yes  . Drug use: Unknown  . Sexual activity: Not on file   Other Topics Concern  . Not on file   Social History Narrative  . No narrative on file    Outpatient Medications Prior to Visit  Medication Sig Dispense Refill  . HYDROcodone-acetaminophen (NORCO/VICODIN) 5-325 MG tablet Take 1 tablet by mouth every 6 (six) hours as needed for moderate pain. 45 tablet 0  . lisinopril (PRINIVIL,ZESTRIL) 20 MG tablet Take 1 tablet (20 mg total) by mouth daily. 90 tablet 3   No facility-administered medications prior to visit.     Allergies  Allergen Reactions  . Penicillins     Review of Systems  Constitutional: Negative for fever and malaise/fatigue.  HENT: Negative for congestion.   Eyes: Negative for blurred vision.  Respiratory: Negative for cough and shortness of  breath.   Cardiovascular: Negative for chest pain, palpitations and leg swelling.  Gastrointestinal: Negative for vomiting.  Musculoskeletal: Negative for back pain.  Skin: Negative for rash.  Neurological: Negative for loss of consciousness and headaches.       Objective:    Physical Exam  Constitutional: He appears well-developed and well-nourished. No distress.  HENT:  Head: Normocephalic and atraumatic.  Eyes: Conjunctivae are normal.  Neck: Normal range of motion. No thyromegaly present.  Cardiovascular: Normal rate and regular rhythm.   Pulmonary/Chest: Effort normal. He has no wheezes.  Abdominal: Soft. Bowel sounds are normal. There is no tenderness.  Musculoskeletal: Normal range of motion. He exhibits no edema or deformity.  Neurological: He is alert.  Skin: Skin is warm and dry. He is not diaphoretic.  Psychiatric: He has a normal mood and affect.    BP 122/82 (BP Location: Left Arm, Cuff Size: Large)   Pulse 78   Temp 98 F (36.7 C) (Oral)   Resp 16   Ht 5\' 8"  (1.727 m)   Wt 268 lb 3.2 oz (121.7 kg)   SpO2 98%   BMI 40.78 kg/m  Wt Readings from Last 3 Encounters:  11/11/16 268 lb 3.2 oz (121.7 kg)  03/25/16 274 lb 9.6 oz (124.6  kg)  03/04/16 273 lb (123.8 kg)   BP Readings from Last 3 Encounters:  11/11/16 122/82  03/25/16 130/88  03/04/16 120/82      There is no immunization history on file for this patient.  Health Maintenance  Topic Date Due  . HIV Screening  05/07/1988  . TETANUS/TDAP  05/07/1992  . INFLUENZA VACCINE  03/04/2017 (Originally 11/19/2016)    Lab Results  Component Value Date   WBC 6.0 11/11/2016   HGB 15.0 11/11/2016   HCT 43.4 11/11/2016   PLT 216.0 11/11/2016   GLUCOSE 104 (H) 11/11/2016   CHOL 180 11/11/2016   TRIG 308.0 (H) 11/11/2016   HDL 42.60 11/11/2016   LDLDIRECT 54.0 11/11/2016   ALT 55 (H) 11/11/2016   AST 39 (H) 11/11/2016   NA 137 11/11/2016   K 4.3 11/11/2016   CL 104 11/11/2016   CREATININE 0.96  11/11/2016   BUN 11 11/11/2016   CO2 29 11/11/2016    No results found for: TSH Lab Results  Component Value Date   WBC 6.0 11/11/2016   HGB 15.0 11/11/2016   HCT 43.4 11/11/2016   MCV 95.1 11/11/2016   PLT 216.0 11/11/2016   Lab Results  Component Value Date   NA 137 11/11/2016   K 4.3 11/11/2016   CO2 29 11/11/2016   GLUCOSE 104 (H) 11/11/2016   BUN 11 11/11/2016   CREATININE 0.96 11/11/2016   BILITOT 0.5 11/11/2016   ALKPHOS 38 (L) 11/11/2016   AST 39 (H) 11/11/2016   ALT 55 (H) 11/11/2016   PROT 7.6 11/11/2016   ALBUMIN 4.2 11/11/2016   CALCIUM 9.5 11/11/2016   GFR 90.64 11/11/2016   Lab Results  Component Value Date   CHOL 180 11/11/2016   Lab Results  Component Value Date   HDL 42.60 11/11/2016   No results found for: [facility] Lab Results  Component Value Date   TRIG 308.0 (H) 11/11/2016   Lab Results  Component Value Date   CHOLHDL 4 11/11/2016   No results found for: HGBA1C       Assessment & Plan:   Problem List Items Addressed This Visit      Unprioritized   Chest pain    Stress test was not done Will re order Pt states it occurs only occassionally      Relevant Orders   Myocardial Perfusion Imaging   Essential hypertension - Primary    Stable Well controlled, no changes to meds. Encouraged heart healthy diet such as the DASH diet and exercise as tolerated.       Relevant Orders   CBC with Differential/Platelet (Completed)   Lipid panel (Completed)   Comprehensive metabolic panel (Completed)   Myocardial Perfusion Imaging      I am having Mr. Preece maintain his lisinopril and HYDROcodone-acetaminophen.  No orders of the defined types were placed in this encounter.   CMA served as Neurosurgeon during this visit. History, Physical and Plan performed by medical provider. Documentation and orders reviewed and attested to.  Donato Schultz, DO

## 2016-11-12 LAB — CBC WITH DIFFERENTIAL/PLATELET
BASOS PCT: 1.2 % (ref 0.0–3.0)
Basophils Absolute: 0.1 10*3/uL (ref 0.0–0.1)
Eosinophils Absolute: 0.1 10*3/uL (ref 0.0–0.7)
Eosinophils Relative: 1.8 % (ref 0.0–5.0)
HCT: 43.4 % (ref 39.0–52.0)
Hemoglobin: 15 g/dL (ref 13.0–17.0)
LYMPHS ABS: 1.9 10*3/uL (ref 0.7–4.0)
Lymphocytes Relative: 32.6 % (ref 12.0–46.0)
MCHC: 34.6 g/dL (ref 30.0–36.0)
MCV: 95.1 fl (ref 78.0–100.0)
MONOS PCT: 15.1 % — AB (ref 3.0–12.0)
Monocytes Absolute: 0.9 10*3/uL (ref 0.1–1.0)
Neutro Abs: 2.9 10*3/uL (ref 1.4–7.7)
Neutrophils Relative %: 49.3 % (ref 43.0–77.0)
PLATELETS: 216 10*3/uL (ref 150.0–400.0)
RBC: 4.57 Mil/uL (ref 4.22–5.81)
RDW: 13.1 % (ref 11.5–15.5)
WBC: 6 10*3/uL (ref 4.0–10.5)

## 2016-11-12 LAB — LIPID PANEL
Cholesterol: 180 mg/dL (ref 0–200)
HDL: 42.6 mg/dL (ref >39.00)
NonHDL: 137.32
Total CHOL/HDL Ratio: 4
Triglycerides: 308 mg/dL — ABNORMAL HIGH (ref 0.0–149.0)
VLDL: 61.6 mg/dL — ABNORMAL HIGH (ref 0.0–40.0)

## 2016-11-12 LAB — COMPREHENSIVE METABOLIC PANEL
ALBUMIN: 4.2 g/dL (ref 3.5–5.2)
ALT: 55 U/L — ABNORMAL HIGH (ref 0–53)
AST: 39 U/L — AB (ref 0–37)
Alkaline Phosphatase: 38 U/L — ABNORMAL LOW (ref 39–117)
BILIRUBIN TOTAL: 0.5 mg/dL (ref 0.2–1.2)
BUN: 11 mg/dL (ref 6–23)
CO2: 29 mEq/L (ref 19–32)
CREATININE: 0.96 mg/dL (ref 0.40–1.50)
Calcium: 9.5 mg/dL (ref 8.4–10.5)
Chloride: 104 mEq/L (ref 96–112)
GFR: 90.64 mL/min (ref >60.00)
Glucose, Bld: 104 mg/dL — ABNORMAL HIGH (ref 70–99)
Potassium: 4.3 mEq/L (ref 3.5–5.1)
SODIUM: 137 meq/L (ref 135–145)
Total Protein: 7.6 g/dL (ref 6.0–8.3)

## 2016-11-12 LAB — LDL CHOLESTEROL, DIRECT: LDL DIRECT: 54 mg/dL

## 2016-11-12 NOTE — Assessment & Plan Note (Signed)
Stress test was not done Will re order Pt states it occurs only occassionally

## 2016-11-12 NOTE — Assessment & Plan Note (Signed)
Stable Well controlled, no changes to meds. Encouraged heart healthy diet such as the DASH diet and exercise as tolerated.  

## 2016-11-13 ENCOUNTER — Other Ambulatory Visit: Payer: Self-pay | Admitting: Family Medicine

## 2016-11-13 DIAGNOSIS — E785 Hyperlipidemia, unspecified: Secondary | ICD-10-CM

## 2016-11-14 ENCOUNTER — Telehealth (HOSPITAL_COMMUNITY): Payer: Self-pay | Admitting: Family Medicine

## 2016-11-20 NOTE — Telephone Encounter (Signed)
User: Trina AoGRIFFIN, Kali Deadwyler A Date/time: 11/17/16 2:17 PM  Comment: Called pt and lmsg for him to CB to get scheduled for myoview.   Context:  Outcome: Left Message  Phone number: [phone] Phone Type: Home Phone  Comm. type: Telephone Call type: Outgoing  Contact: Mittie BodoBurdette, Anshul Relation to patient: Self    User: Trina AoGRIFFIN, Gwin Eagon A Date/time: 11/14/16 2:21 PM  Comment: Called pt and lmsg for her to CB to get scheduled for echo.   Context:  Outcome: Left Message  Phone number: [phone] Phone Type: Home Phone  Comm. type: Telephone Call type: Outgoing  Contact: Mittie BodoBurdette, Keldon Relation to patient: Self   User: Trina AoGRIFFIN, Anira [place] A Date/time: 11/18/16 2:43 PM  Comment: Called pt and lmsg for him to CB to get scheduled for myoview.   Context: [place] Schedule Orders/Appt Requests Outcome: Left Message  Phone number: [phone] Phone Type: Home Phone  Comm. type: Telephone Call type: Outgoing  Contact: Mittie BodoBurdette, Michai Relation to patient: Self

## 2016-11-26 ENCOUNTER — Telehealth: Payer: Self-pay | Admitting: Family Medicine

## 2016-11-26 ENCOUNTER — Encounter: Payer: Self-pay | Admitting: Family Medicine

## 2016-11-26 DIAGNOSIS — M25562 Pain in left knee: Principal | ICD-10-CM

## 2016-11-26 DIAGNOSIS — G8929 Other chronic pain: Secondary | ICD-10-CM

## 2016-11-26 DIAGNOSIS — M25561 Pain in right knee: Principal | ICD-10-CM

## 2016-11-26 NOTE — Telephone Encounter (Signed)
°  Relation to UJ:WJXBpt:self Call back number:[phone]   Reason for call:  Patient requesting a refill HYDROcodone-acetaminophen (NORCO/VICODIN) 5-325 MG tablet

## 2016-11-27 NOTE — Telephone Encounter (Signed)
Last ov 11/11/16 Last refill 10/30/16 #45 0 Last UDS 07/01/16 low risk, signed contract. Please advise. LB

## 2016-11-27 NOTE — Telephone Encounter (Signed)
OK to refill

## 2016-11-28 MED ORDER — HYDROCODONE-ACETAMINOPHEN 5-325 MG PO TABS: 1.0000 | ORAL_TABLET | Freq: Four times a day (QID) | ORAL | 0 refills | Status: DC | PRN

## 2016-11-28 NOTE — Telephone Encounter (Signed)
Left message on pt's vm informing him that his Rx is ready for pick up. Rx located in front in folder. LB

## 2016-11-28 NOTE — Telephone Encounter (Signed)
Rx given to covering provider Esperanza RichtersEdward Saguier for approval. LB

## 2016-12-08 ENCOUNTER — Telehealth (HOSPITAL_COMMUNITY): Payer: Self-pay | Admitting: *Deleted

## 2016-12-08 NOTE — Telephone Encounter (Signed)
Left message on voicemail per DPR in reference to upcoming appointment scheduled on 12/10/16 with detailed instructions given per Myocardial Perfusion Study Information Sheet for the test. LM to arrive 15 minutes early, and that it is imperative to arrive on time for appointment to keep from having the test rescheduled. If you need to cancel or reschedule your appointment, please call the office within 24 hours of your appointment. Failure to do so may result in a cancellation of your appointment, and a $50 no show fee. Phone number given for call back for any questions. Iliya Spivack Jacqueline     

## 2016-12-10 ENCOUNTER — Ambulatory Visit (HOSPITAL_COMMUNITY): Payer: 59 | Attending: Cardiology

## 2016-12-10 DIAGNOSIS — I517 Cardiomegaly: Secondary | ICD-10-CM | POA: Insufficient documentation

## 2016-12-10 DIAGNOSIS — R0789 Other chest pain: Secondary | ICD-10-CM | POA: Diagnosis present

## 2016-12-10 DIAGNOSIS — I10 Essential (primary) hypertension: Secondary | ICD-10-CM | POA: Insufficient documentation

## 2016-12-10 LAB — MYOCARDIAL PERFUSION IMAGING
CHL CUP MPHR: 177 {beats}/min
CHL CUP NUCLEAR SSS: 3
Estimated workload: 7 METS
Exercise duration (min): 6 min
Exercise duration (sec): 1 s
LV dias vol: 128 mL (ref 62–150)
LV sys vol: 61 mL
Peak HR: 160 {beats}/min
Percent HR: 90 %
RATE: 0.33
Rest HR: 75 {beats}/min
SDS: 3
SRS: 2
TID: 0.86

## 2016-12-10 MED ORDER — TECHNETIUM TC 99M TETROFOSMIN IV KIT
31.6000 | PACK | Freq: Once | INTRAVENOUS | Status: AC | PRN
  Administered 2016-12-10: 31.6 via INTRAVENOUS
  Filled 2016-12-10: qty 32

## 2016-12-10 MED ORDER — TECHNETIUM TC 99M TETROFOSMIN IV KIT
10.7000 | PACK | Freq: Once | INTRAVENOUS | Status: AC | PRN
  Administered 2016-12-10: 10.7 via INTRAVENOUS
  Filled 2016-12-10: qty 11

## 2016-12-25 ENCOUNTER — Other Ambulatory Visit: Payer: Self-pay | Admitting: Medical

## 2016-12-25 ENCOUNTER — Encounter: Payer: Self-pay | Admitting: Family Medicine

## 2016-12-25 DIAGNOSIS — G8929 Other chronic pain: Secondary | ICD-10-CM

## 2016-12-25 DIAGNOSIS — M25561 Pain in right knee: Principal | ICD-10-CM

## 2016-12-25 DIAGNOSIS — M25562 Pain in left knee: Principal | ICD-10-CM

## 2016-12-25 NOTE — Telephone Encounter (Signed)
Relation to ZO:XWRUpt:self Call back number:[phone] Pharmacy:  Reason for call:  Patient requesting HYDROcodone-acetaminophen (NORCO/VICODIN) 5-325 MG tablet refill, please advise

## 2016-12-29 NOTE — Telephone Encounter (Signed)
Pt is requesting refill on Hydrocodone 5-325mg .  Last OV: 11/11/2016 Last Fill: 11/28/2016 #45 and 0RF UDS: 07/01/2016 Low risk  [facility] database 11/03/2016 (in media)  Please advise.

## 2016-12-29 NOTE — Telephone Encounter (Signed)
Relation to ZO:XWRUpt:self Call back number: [phone] (M)   Reason for call:  Patient checking on the status HYDROcodone-acetaminophen (NORCO/VICODIN) 5-325 MG tablet refill request,patient states due to the weather this week, he would like to come in and pick up Rx, patient states he has 3 left.

## 2016-12-29 NOTE — Telephone Encounter (Signed)
Refill x1 

## 2016-12-30 MED ORDER — HYDROCODONE-ACETAMINOPHEN 5-325 MG PO TABS: 1.0000 | ORAL_TABLET | Freq: Four times a day (QID) | ORAL | 0 refills | Status: DC | PRN

## 2016-12-30 NOTE — Telephone Encounter (Signed)
Rx printed, awaiting DO signature.  

## 2016-12-30 NOTE — Telephone Encounter (Signed)
Pt informed via MyChart that Rx has been placed at front desk for pick up at his convenience.  

## 2016-12-30 NOTE — Telephone Encounter (Signed)
Ins will only pay for seven days unless we get authorization. Hydrocodone acetaminiophen 5-325 mg.  Instructions one every six hours as needed. Get auth from insurance please [employer].

## 2017-01-01 NOTE — Telephone Encounter (Signed)
Ended up doing PA over the phone due to long determination [facility]. PA was approved with dates 01/01/17-07/01/17. PA # [id number]. Attempted to reach pt unable to reach left vm to call back

## 2017-01-01 NOTE — Telephone Encounter (Signed)
Insurance is faxing over the authorization request form

## 2017-01-01 NOTE — Telephone Encounter (Signed)
[facility]

## 2017-01-07 NOTE — Telephone Encounter (Signed)
See refill message on 9/6

## 2017-01-26 ENCOUNTER — Telehealth: Payer: Self-pay | Admitting: Family Medicine

## 2017-01-26 DIAGNOSIS — G8929 Other chronic pain: Secondary | ICD-10-CM

## 2017-01-26 DIAGNOSIS — M25562 Pain in left knee: Principal | ICD-10-CM

## 2017-01-26 DIAGNOSIS — M25561 Pain in right knee: Principal | ICD-10-CM

## 2017-01-26 NOTE — Telephone Encounter (Signed)
Requesting:   hydrocodone Contract    07/01/2016 UDS   Low risk next is due on 01/01/2017 Last OV    11/11/2016 Last Refill     #45 no refills on 12/30/2016  Please Advise

## 2017-01-26 NOTE — Telephone Encounter (Signed)
Relation to WU:JWJX Call back [phone]     Reason for call:  Patient requesting a refill HYDROcodone-acetaminophen (NORCO/VICODIN) 5-325 MG tablet, please advise

## 2017-01-26 NOTE — Telephone Encounter (Signed)
Data base needed Pt will need to do another uds --- let pt know about new policy--- I can give pt 3 rx postdated but he will need to be seen every 3 months

## 2017-01-27 MED ORDER — HYDROCODONE-ACETAMINOPHEN 5-325 MG PO TABS: 1.0000 | ORAL_TABLET | Freq: Four times a day (QID) | ORAL | 0 refills | Status: DC | PRN

## 2017-01-27 NOTE — Telephone Encounter (Signed)
Rx printed, awaiting DO signature.  

## 2017-01-27 NOTE — Telephone Encounter (Signed)
Tiffany- please inform Pt that Rx has been placed at front desk for pick up at his convenience. Thank you.

## 2017-01-27 NOTE — Telephone Encounter (Signed)
NCCR printed, placed on ledge for PCP review on return to office.

## 2017-01-28 NOTE — Telephone Encounter (Signed)
Patient informed. 

## 2017-01-29 ENCOUNTER — Other Ambulatory Visit (INDEPENDENT_AMBULATORY_CARE_PROVIDER_SITE_OTHER): Payer: 59

## 2017-01-29 DIAGNOSIS — M25562 Pain in left knee: Secondary | ICD-10-CM

## 2017-01-29 DIAGNOSIS — M25561 Pain in right knee: Secondary | ICD-10-CM

## 2017-01-29 DIAGNOSIS — G8929 Other chronic pain: Secondary | ICD-10-CM | POA: Diagnosis not present

## 2017-02-03 LAB — PAIN MGMT, PROFILE 8 W/CONF, U
6 Acetylmorphine: NEGATIVE ng/mL (ref <10)
ALCOHOL METABOLITES: NEGATIVE ng/mL (ref <500)
Amphetamines: NEGATIVE ng/mL (ref <500)
BENZODIAZEPINES: NEGATIVE ng/mL (ref <100)
Buprenorphine, Urine: NEGATIVE ng/mL (ref <5)
COCAINE METABOLITE: NEGATIVE ng/mL (ref <150)
Codeine: NEGATIVE ng/mL (ref <50)
Creatinine: 136.7 mg/dL
Hydrocodone: 162 ng/mL — ABNORMAL HIGH (ref <50)
Hydromorphone: 249 ng/mL — ABNORMAL HIGH (ref <50)
MDMA: NEGATIVE ng/mL (ref <500)
Marijuana Metabolite: NEGATIVE ng/mL (ref <20)
Morphine: NEGATIVE ng/mL (ref <50)
Norhydrocodone: 289 ng/mL — ABNORMAL HIGH (ref <50)
OXIDANT: NEGATIVE ug/mL (ref <200)
Opiates: POSITIVE ng/mL — AB (ref <100)
Oxycodone: NEGATIVE ng/mL (ref <100)
pH: 6.91 (ref 4.5–9.0)

## 2017-02-12 ENCOUNTER — Encounter: Payer: Self-pay | Admitting: Family Medicine

## 2017-02-12 ENCOUNTER — Ambulatory Visit (INDEPENDENT_AMBULATORY_CARE_PROVIDER_SITE_OTHER): Payer: 59 | Admitting: Family Medicine

## 2017-02-12 DIAGNOSIS — I10 Essential (primary) hypertension: Secondary | ICD-10-CM

## 2017-02-12 DIAGNOSIS — M25561 Pain in right knee: Secondary | ICD-10-CM

## 2017-02-12 DIAGNOSIS — M25562 Pain in left knee: Secondary | ICD-10-CM | POA: Diagnosis not present

## 2017-02-12 DIAGNOSIS — G8929 Other chronic pain: Secondary | ICD-10-CM | POA: Diagnosis not present

## 2017-02-12 MED ORDER — LISINOPRIL 20 MG PO TABS
ORAL_TABLET | ORAL | 3 refills | Status: DC
Start: 2017-02-12 — End: 2017-10-20

## 2017-02-12 MED ORDER — HYDROCODONE-ACETAMINOPHEN 5-325 MG PO TABS: 1.0000 | ORAL_TABLET | Freq: Four times a day (QID) | ORAL | 0 refills | Status: DC | PRN

## 2017-02-12 NOTE — Progress Notes (Signed)
Patient ID: Jonathan Hansen, male    DOB: 1976/01/22  Age: 41 y.o. MRN: [id number]    Subjective:  Subjective  HPI Jonathan BodoWilliam Acoff presents for f/u htn.   Home readings  128/90.  No cp, sob unless he stresses and then cp starts.    Review of Systems  Constitutional: Negative for appetite change, diaphoresis, fatigue and unexpected weight change.  Eyes: Negative for pain, redness and visual disturbance.  Respiratory: Negative for cough, chest tightness, shortness of breath and wheezing.   Cardiovascular: Negative for chest pain, palpitations and leg swelling.  Endocrine: Negative for cold intolerance, heat intolerance, polydipsia, polyphagia and polyuria.  Genitourinary: Negative for difficulty urinating, dysuria and frequency.  Neurological: Negative for dizziness, light-headedness, numbness and headaches.    History Past Medical History:  Diagnosis Date  . HTN (hypertension)     He has a past surgical history that includes Vasectomy.   His family history includes Coronary artery disease in his father; Stroke in his father.He reports that he has been smoking Cigarettes.  He has a 25.00 pack-year smoking history. He has never used smokeless tobacco. He reports that he drinks alcohol. His drug history is not on file.  No current outpatient prescriptions on file prior to visit.   No current facility-administered medications on file prior to visit.      Objective:  Objective  Physical Exam  Constitutional: He is oriented to person, place, and time. Vital signs are normal. He appears well-developed and well-nourished. He is sleeping.  HENT:  Head: Normocephalic and atraumatic.  Mouth/Throat: Oropharynx is clear and moist.  Eyes: Pupils are equal, round, and reactive to light. EOM are normal.  Neck: Normal range of motion. Neck supple. No thyromegaly present.  Cardiovascular: Normal rate and regular rhythm.   No murmur heard. Pulmonary/Chest: Effort normal and breath sounds  normal. No respiratory distress. He has no wheezes. He has no rales. He exhibits no tenderness.  Musculoskeletal: He exhibits no edema or tenderness.  Neurological: He is alert and oriented to person, place, and time.  Skin: Skin is warm and dry.  Psychiatric: He has a normal mood and affect. His behavior is normal. Judgment and thought content normal.  Nursing note and vitals reviewed.  BP (!) 138/98   Pulse 94   Temp 97.9 F (36.6 C) (Oral)   Ht 5\' 8"  (1.727 m)   Wt 278 lb (126.1 kg)   SpO2 98%   BMI 42.27 kg/m  Wt Readings from Last 3 Encounters:  02/12/17 278 lb (126.1 kg)  11/11/16 268 lb 3.2 oz (121.7 kg)  03/25/16 274 lb 9.6 oz (124.6 kg)     Lab Results  Component Value Date   WBC 6.0 11/11/2016   HGB 15.0 11/11/2016   HCT 43.4 11/11/2016   PLT 216.0 11/11/2016   GLUCOSE 104 (H) 11/11/2016   CHOL 180 11/11/2016   TRIG 308.0 (H) 11/11/2016   HDL 42.60 11/11/2016   LDLDIRECT 54.0 11/11/2016   ALT 55 (H) 11/11/2016   AST 39 (H) 11/11/2016   NA 137 11/11/2016   K 4.3 11/11/2016   CL 104 11/11/2016   CREATININE 0.96 11/11/2016   BUN 11 11/11/2016   CO2 29 11/11/2016    No results found.   Assessment & Plan:  Plan  I have changed Mr. Freitas's lisinopril. I am also having him start on HYDROcodone-acetaminophen and HYDROcodone-acetaminophen. Additionally, I am having him maintain his HYDROcodone-acetaminophen.  Meds ordered this encounter  Medications  . lisinopril (PRINIVIL,ZESTRIL) 20 MG  tablet    Sig: 2 po qd    Dispense:  180 tablet    Refill:  3  . HYDROcodone-acetaminophen (NORCO/VICODIN) 5-325 MG tablet    Sig: Take 1 tablet by mouth every 6 (six) hours as needed for moderate pain.    Dispense:  45 tablet    Refill:  0    Do not fill until Feb 27 2017  . HYDROcodone-acetaminophen (NORCO/VICODIN) 5-325 MG tablet    Sig: Take 1 tablet by mouth every 6 (six) hours as needed for moderate pain.    Dispense:  45 tablet    Refill:  0    Do not fill  until Mar 29, 2017  . HYDROcodone-acetaminophen (NORCO/VICODIN) 5-325 MG tablet    Sig: Take 1 tablet by mouth every 6 (six) hours as needed for moderate pain.    Dispense:  45 tablet    Refill:  0    Do not fill until jan 9,2018    Problem List Items Addressed This Visit      Unprioritized   Chronic pain of both knees   Relevant Medications   HYDROcodone-acetaminophen (NORCO/VICODIN) 5-325 MG tablet   HYDROcodone-acetaminophen (NORCO/VICODIN) 5-325 MG tablet   HYDROcodone-acetaminophen (NORCO/VICODIN) 5-325 MG tablet   Essential hypertension    Well controlled, no changes to meds. Encouraged heart healthy diet such as the DASH diet and exercise as tolerated.       Relevant Medications   lisinopril (PRINIVIL,ZESTRIL) 20 MG tablet   Other Relevant Orders   Lipid panel   Comprehensive metabolic panel      Follow-up: Return in about 3 months (around 05/15/2017), or if symptoms worsen or fail to improve, for pain management.  Donato Schultz, DO

## 2017-02-12 NOTE — Assessment & Plan Note (Signed)
Well controlled, no changes to meds. Encouraged heart healthy diet such as the DASH diet and exercise as tolerated.  °

## 2017-02-12 NOTE — Patient Instructions (Signed)

## 2017-02-17 ENCOUNTER — Other Ambulatory Visit (INDEPENDENT_AMBULATORY_CARE_PROVIDER_SITE_OTHER): Payer: 59

## 2017-02-17 DIAGNOSIS — E785 Hyperlipidemia, unspecified: Secondary | ICD-10-CM

## 2017-02-17 LAB — COMPREHENSIVE METABOLIC PANEL
ALK PHOS: 41 U/L (ref 39–117)
ALT: 69 U/L — AB (ref 0–53)
AST: 49 U/L — AB (ref 0–37)
Albumin: 4.1 g/dL (ref 3.5–5.2)
BILIRUBIN TOTAL: 0.5 mg/dL (ref 0.2–1.2)
BUN: 12 mg/dL (ref 6–23)
CO2: 27 mEq/L (ref 19–32)
CREATININE: 0.9 mg/dL (ref 0.40–1.50)
Calcium: 9.3 mg/dL (ref 8.4–10.5)
Chloride: 102 mEq/L (ref 96–112)
GFR: 97.53 mL/min (ref >60.00)
GLUCOSE: 104 mg/dL — AB (ref 70–99)
Potassium: 4.7 mEq/L (ref 3.5–5.1)
SODIUM: 138 meq/L (ref 135–145)
TOTAL PROTEIN: 7.3 g/dL (ref 6.0–8.3)

## 2017-02-17 LAB — LIPID PANEL
Cholesterol: 161 mg/dL (ref 0–200)
HDL: 36.9 mg/dL — ABNORMAL LOW (ref >39.00)
Total CHOL/HDL Ratio: 4
Triglycerides: 428 mg/dL — ABNORMAL HIGH (ref 0.0–149.0)

## 2017-02-17 LAB — LDL CHOLESTEROL, DIRECT: Direct LDL: 40 mg/dL

## 2017-02-19 ENCOUNTER — Telehealth: Payer: Self-pay | Admitting: Family Medicine

## 2017-02-19 NOTE — Telephone Encounter (Signed)
Please [facility] [place] pt [facility].

## 2017-02-20 NOTE — Telephone Encounter (Signed)
rx called in

## 2017-04-08 ENCOUNTER — Telehealth: Payer: Self-pay | Admitting: Family Medicine

## 2017-04-08 NOTE — Telephone Encounter (Signed)
Copied from CRM [phone]. Topic: Inquiry >> Apr 08, 2017 10:44 AM Jonathan Hansen, Jonathan Hansen: Reason for CRM: pt called b/c he is having back spasms, pt wants to know if he can get a muscle relaxer, contact pt to advise

## 2017-04-08 NOTE — Telephone Encounter (Signed)
Patient called and wants a muscle relaxer, back is hurting. [facility] [place], and call if u have any questions.

## 2017-04-09 MED ORDER — CYCLOBENZAPRINE HCL 10 MG PO TABS: 10.0000 mg | ORAL_TABLET | Freq: Three times a day (TID) | ORAL | 1 refills | Status: DC | PRN

## 2017-04-09 NOTE — Telephone Encounter (Signed)
Rx sent 

## 2017-04-09 NOTE — Telephone Encounter (Signed)
Flexeril 10 mg #30  1 po tid prn  1 refills Ov if it does not help

## 2017-05-19 ENCOUNTER — Ambulatory Visit (INDEPENDENT_AMBULATORY_CARE_PROVIDER_SITE_OTHER): Payer: 59 | Admitting: Family Medicine

## 2017-05-19 ENCOUNTER — Encounter: Payer: Self-pay | Admitting: Family Medicine

## 2017-05-19 VITALS — BP 126/76 | HR 85 | Temp 98.2°F | Resp 16 | Ht 68.0 in | Wt 282.8 lb

## 2017-05-19 DIAGNOSIS — I10 Essential (primary) hypertension: Secondary | ICD-10-CM

## 2017-05-19 DIAGNOSIS — M25561 Pain in right knee: Secondary | ICD-10-CM | POA: Diagnosis not present

## 2017-05-19 DIAGNOSIS — M25562 Pain in left knee: Secondary | ICD-10-CM

## 2017-05-19 DIAGNOSIS — G8929 Other chronic pain: Secondary | ICD-10-CM

## 2017-05-19 MED ORDER — HYDROCODONE-ACETAMINOPHEN 5-325 MG PO TABS: 1.0000 | ORAL_TABLET | Freq: Four times a day (QID) | ORAL | 0 refills | Status: DC | PRN

## 2017-05-19 NOTE — Assessment & Plan Note (Signed)
Weight loss Refill pain meds rto 3 months

## 2017-05-19 NOTE — Assessment & Plan Note (Signed)
Well controlled, no changes to meds. Encouraged heart healthy diet such as the DASH diet and exercise as tolerated.  °

## 2017-05-19 NOTE — Progress Notes (Signed)
Patient ID: Jonathan Hansen, male   DOB: 21-Mar-1976, 42 y.o.   MRN: [id number]    Subjective:  I acted as a Neurosurgeonscribe for Dr. Zola ButtonLowne-Chase.  Jonathan Hansen, CMA   Patient ID: Jonathan Hansen, male    DOB: 21-Mar-1976, 42 y.o.   MRN: [id number]  Chief Complaint  Patient presents with  . chronic pain follow up    HPI   Patient is in today for follow up chronic knee pain.   Patient Care Team: Zola ButtonLowne Chase, Grayling CongressYvonne R, DO as PCP - General (Family Medicine)   Past Medical History:  Diagnosis Date  . HTN (hypertension)     Past Surgical History:  Procedure Laterality Date  . VASECTOMY      Family History  Problem Relation Age of Onset  . Stroke Father   . Coronary artery disease Father        carotid stenosis    Social History   Socioeconomic History  . Marital status: Married    Spouse name: Not on file  . Number of children: Not on file  . Years of education: Not on file  . Highest education level: Not on file  Social Needs  . Financial resource strain: Not on file  . Food insecurity - worry: Not on file  . Food insecurity - inability: Not on file  . Transportation needs - medical: Not on file  . Transportation needs - non-medical: Not on file  Occupational History  . Not on file  Tobacco Use  . Smoking status: Current Every Day Smoker    Packs/day: 1.00    Years: 25.00    Pack years: 25.00    Types: Cigarettes  . Smokeless tobacco: Never Used  Substance and Sexual Activity  . Alcohol use: Yes  . Drug use: Not on file  . Sexual activity: Not on file  Other Topics Concern  . Not on file  Social History Narrative  . Not on file    Outpatient Medications Prior to Visit  Medication Sig Dispense Refill  . cyclobenzaprine (FLEXERIL) 10 MG tablet Take 1 tablet (10 mg total) by mouth 3 (three) times daily as needed for muscle spasms. 30 tablet 1  . lisinopril (PRINIVIL,ZESTRIL) 20 MG tablet 2 po qd 180 tablet 3  . HYDROcodone-acetaminophen (NORCO/VICODIN) 5-325 MG tablet  Take 1 tablet by mouth every 6 (six) hours as needed for moderate pain. 45 tablet 0  . HYDROcodone-acetaminophen (NORCO/VICODIN) 5-325 MG tablet Take 1 tablet by mouth every 6 (six) hours as needed for moderate pain. 45 tablet 0  . HYDROcodone-acetaminophen (NORCO/VICODIN) 5-325 MG tablet Take 1 tablet by mouth every 6 (six) hours as needed for moderate pain. 45 tablet 0   No facility-administered medications prior to visit.     Allergies  Allergen Reactions  . Penicillins     Review of Systems  Constitutional: Negative for fever and malaise/fatigue.  HENT: Negative for congestion.   Eyes: Negative for blurred vision.  Respiratory: Negative for cough and shortness of breath.   Cardiovascular: Negative for chest pain, palpitations and leg swelling.  Gastrointestinal: Negative for vomiting.  Musculoskeletal: Negative for back pain.  Skin: Negative for rash.  Neurological: Negative for loss of consciousness and headaches.       Objective:    Physical Exam  Constitutional: He is oriented to person, place, and time. Vital signs are normal. He appears well-developed and well-nourished. He is sleeping.  HENT:  Head: Normocephalic and atraumatic.  Mouth/Throat: Oropharynx is clear and moist.  Eyes:  EOM are normal. Pupils are equal, round, and reactive to light.  Neck: Normal range of motion. Neck supple. No thyromegaly present.  Cardiovascular: Normal rate and regular rhythm.  No murmur heard. Pulmonary/Chest: Effort normal and breath sounds normal. No respiratory distress. He has no wheezes. He has no rales. He exhibits no tenderness.  Musculoskeletal: He exhibits tenderness. He exhibits no edema.  Neurological: He is alert and oriented to person, place, and time.  Skin: Skin is warm and dry.  Psychiatric: He has a normal mood and affect. His behavior is normal. Judgment and thought content normal.  Nursing note and vitals reviewed.   BP 126/76 (BP Location: Left Arm, Cuff Size:  Large)   Pulse 85   Temp 98.2 F (36.8 C) (Oral)   Resp 16   Ht 5\' 8"  (1.727 m)   Wt 282 lb 12.8 oz (128.3 kg)   SpO2 97%   BMI 43.00 kg/m  Wt Readings from Last 3 Encounters:  05/19/17 282 lb 12.8 oz (128.3 kg)  02/12/17 278 lb (126.1 kg)  11/11/16 268 lb 3.2 oz (121.7 kg)   BP Readings from Last 3 Encounters:  05/19/17 126/76  02/12/17 (!) 138/98  11/11/16 122/82      There is no immunization history on file for this patient.  Health Maintenance  Topic Date Due  . HIV Screening  05/07/1988  . TETANUS/TDAP  05/07/1992  . INFLUENZA VACCINE  11/19/2016    Lab Results  Component Value Date   WBC 6.0 11/11/2016   HGB 15.0 11/11/2016   HCT 43.4 11/11/2016   PLT 216.0 11/11/2016   GLUCOSE 104 (H) 02/17/2017   CHOL 161 02/17/2017   TRIG (H) 02/17/2017    428.0 Triglyceride is over 400; calculations on Lipids are invalid.   HDL 36.90 (L) 02/17/2017   LDLDIRECT 40.0 02/17/2017   ALT 69 (H) 02/17/2017   AST 49 (H) 02/17/2017   NA 138 02/17/2017   K 4.7 02/17/2017   CL 102 02/17/2017   CREATININE 0.90 02/17/2017   BUN 12 02/17/2017   CO2 27 02/17/2017    No results found for: TSH Lab Results  Component Value Date   WBC 6.0 11/11/2016   HGB 15.0 11/11/2016   HCT 43.4 11/11/2016   MCV 95.1 11/11/2016   PLT 216.0 11/11/2016   Lab Results  Component Value Date   NA 138 02/17/2017   K 4.7 02/17/2017   CO2 27 02/17/2017   GLUCOSE 104 (H) 02/17/2017   BUN 12 02/17/2017   CREATININE 0.90 02/17/2017   BILITOT 0.5 02/17/2017   ALKPHOS 41 02/17/2017   AST 49 (H) 02/17/2017   ALT 69 (H) 02/17/2017   PROT 7.3 02/17/2017   ALBUMIN 4.1 02/17/2017   CALCIUM 9.3 02/17/2017   GFR 97.53 02/17/2017   Lab Results  Component Value Date   CHOL 161 02/17/2017   Lab Results  Component Value Date   HDL 36.90 (L) 02/17/2017   No results found for: [facility] Lab Results  Component Value Date   TRIG (H) 02/17/2017    428.0 Triglyceride is over 400; calculations on  Lipids are invalid.   Lab Results  Component Value Date   CHOLHDL 4 02/17/2017   No results found for: HGBA1C       Assessment & Plan:   Problem List Items Addressed This Visit      Unprioritized   Chronic pain of both knees    Weight loss Refill pain meds rto 3 months  Relevant Medications   HYDROcodone-acetaminophen (NORCO/VICODIN) 5-325 MG tablet   HYDROcodone-acetaminophen (NORCO/VICODIN) 5-325 MG tablet   HYDROcodone-acetaminophen (NORCO/VICODIN) 5-325 MG tablet   Essential hypertension - Primary    Well controlled, no changes to meds. Encouraged heart healthy diet such as the DASH diet and exercise as tolerated.       Relevant Orders   Lipid panel   Comprehensive metabolic panel   Morbid obesity (HCC)    Gave pt info for healthy weight and wellness         I am having Jonathan Hansen maintain his lisinopril, cyclobenzaprine, HYDROcodone-acetaminophen, HYDROcodone-acetaminophen, and HYDROcodone-acetaminophen.  Meds ordered this encounter  Medications  . HYDROcodone-acetaminophen (NORCO/VICODIN) 5-325 MG tablet    Sig: Take 1 tablet by mouth every 6 (six) hours as needed for moderate pain.    Dispense:  45 tablet    Refill:  0    Do not fill until July 28, 2017  . HYDROcodone-acetaminophen (NORCO/VICODIN) 5-325 MG tablet    Sig: Take 1 tablet by mouth every 6 (six) hours as needed for moderate pain.    Dispense:  45 tablet    Refill:  0    Do not fill until  March 9,2019  . HYDROcodone-acetaminophen (NORCO/VICODIN) 5-325 MG tablet    Sig: Take 1 tablet by mouth every 6 (six) hours as needed for moderate pain.    Dispense:  45 tablet    Refill:  0    Do not fill until feb 9,2019    CMA served as scribe during this visit. History, Physical and Plan performed by medical provider. Documentation and orders reviewed and attested to.  Donato Schultz, DO

## 2017-05-19 NOTE — Patient Instructions (Signed)

## 2017-05-19 NOTE — Assessment & Plan Note (Signed)
Gave pt info for healthy weight and wellness 

## 2017-05-20 LAB — COMPREHENSIVE METABOLIC PANEL
ALT: 53 U/L (ref 0–53)
AST: 34 U/L (ref 0–37)
Albumin: 4.3 g/dL (ref 3.5–5.2)
Alkaline Phosphatase: 39 U/L (ref 39–117)
BILIRUBIN TOTAL: 0.7 mg/dL (ref 0.2–1.2)
BUN: 13 mg/dL (ref 6–23)
CO2: 31 meq/L (ref 19–32)
CREATININE: 0.89 mg/dL (ref 0.40–1.50)
Calcium: 9.3 mg/dL (ref 8.4–10.5)
Chloride: 103 mEq/L (ref 96–112)
GFR: 98.68 mL/min (ref >60.00)
Glucose, Bld: 133 mg/dL — ABNORMAL HIGH (ref 70–99)
Potassium: 4.2 mEq/L (ref 3.5–5.1)
SODIUM: 141 meq/L (ref 135–145)
Total Protein: 7.6 g/dL (ref 6.0–8.3)

## 2017-05-20 LAB — LIPID PANEL
CHOL/HDL RATIO: 5
Cholesterol: 170 mg/dL (ref 0–200)
HDL: 36.6 mg/dL — ABNORMAL LOW (ref >39.00)
NONHDL: 133.48
Triglycerides: 388 mg/dL — ABNORMAL HIGH (ref 0.0–149.0)
VLDL: 77.6 mg/dL — ABNORMAL HIGH (ref 0.0–40.0)

## 2017-05-20 LAB — LDL CHOLESTEROL, DIRECT: LDL DIRECT: 46 mg/dL

## 2017-05-26 ENCOUNTER — Telehealth: Payer: Self-pay

## 2017-05-26 ENCOUNTER — Telehealth: Payer: Self-pay | Admitting: Family Medicine

## 2017-05-26 NOTE — Telephone Encounter (Signed)
Copied from CRM [phone]. Topic: Quick Communication - Lab Results >> May 26, 2017  4:23 PM Stephannie LiSimmons, Hodges Treiber L, NT wrote: Patient called for lab results please call [phone] Ok for triage to disclose

## 2017-05-26 NOTE — Telephone Encounter (Signed)
Pt given lab results. Documented in result note.  

## 2017-05-26 NOTE — Telephone Encounter (Signed)
-----   Message from Donato SchultzYvonne R Lowne Chase, DO sent at 05/24/2017  6:48 PM EST ----- Cholesterol--- LDL goal < 100,  HDL >40,  TG < 150.  Diet and exercise will increase HDL and decrease LDL and TG.  Fish,  Fish Oil, Flaxseed oil will also help increase the HDL and decrease Triglycerides.   Recheck labs in 3 months. Better! Glucose elevated--- watch simple sugars and starches.  Repeat 3 months Lipid, cmp, hgba1c

## 2017-08-06 ENCOUNTER — Encounter (INDEPENDENT_AMBULATORY_CARE_PROVIDER_SITE_OTHER): Payer: Self-pay

## 2017-08-13 ENCOUNTER — Encounter (INDEPENDENT_AMBULATORY_CARE_PROVIDER_SITE_OTHER): Payer: Self-pay | Admitting: Family Medicine

## 2017-08-13 ENCOUNTER — Ambulatory Visit (INDEPENDENT_AMBULATORY_CARE_PROVIDER_SITE_OTHER): Payer: 59 | Admitting: Family Medicine

## 2017-08-13 VITALS — BP 121/81 | HR 71 | Temp 97.8°F | Ht 68.0 in | Wt 281.0 lb

## 2017-08-13 DIAGNOSIS — R0609 Other forms of dyspnea: Secondary | ICD-10-CM

## 2017-08-13 DIAGNOSIS — Z1331 Encounter for screening for depression: Secondary | ICD-10-CM

## 2017-08-13 DIAGNOSIS — Z0289 Encounter for other administrative examinations: Secondary | ICD-10-CM

## 2017-08-13 DIAGNOSIS — Z9189 Other specified personal risk factors, not elsewhere classified: Secondary | ICD-10-CM

## 2017-08-13 DIAGNOSIS — I10 Essential (primary) hypertension: Secondary | ICD-10-CM

## 2017-08-13 DIAGNOSIS — R5383 Other fatigue: Secondary | ICD-10-CM

## 2017-08-13 DIAGNOSIS — Z6841 Body Mass Index (BMI) 40.0 and over, adult: Secondary | ICD-10-CM

## 2017-08-14 LAB — TSH: TSH: 1.57 u[IU]/mL (ref 0.450–4.500)

## 2017-08-14 LAB — CBC WITH DIFFERENTIAL
BASOS ABS: 0 10*3/uL (ref 0.0–0.2)
Basos: 0 %
EOS (ABSOLUTE): 0.1 10*3/uL (ref 0.0–0.4)
Eos: 2 %
Hematocrit: 43.2 % (ref 37.5–51.0)
Hemoglobin: 14.7 g/dL (ref 13.0–17.7)
IMMATURE GRANULOCYTES: 0 %
Immature Grans (Abs): 0 10*3/uL (ref 0.0–0.1)
LYMPHS ABS: 1.9 10*3/uL (ref 0.7–3.1)
Lymphs: 36 %
MCH: 31.5 pg (ref 26.6–33.0)
MCHC: 34 g/dL (ref 31.5–35.7)
MCV: 93 fL (ref 79–97)
MONOS ABS: 0.6 10*3/uL (ref 0.1–0.9)
Monocytes: 12 %
NEUTROS PCT: 50 %
Neutrophils Absolute: 2.7 10*3/uL (ref 1.4–7.0)
RBC: 4.66 x10E6/uL (ref 4.14–5.80)
RDW: 14 % (ref 12.3–15.4)
WBC: 5.3 10*3/uL (ref 3.4–10.8)

## 2017-08-14 LAB — FOLATE

## 2017-08-14 LAB — COMPREHENSIVE METABOLIC PANEL
A/G RATIO: 1.4 (ref 1.2–2.2)
ALBUMIN: 4.2 g/dL (ref 3.5–5.5)
ALK PHOS: 45 IU/L (ref 39–117)
ALT: 69 IU/L — ABNORMAL HIGH (ref 0–44)
AST: 46 IU/L — ABNORMAL HIGH (ref 0–40)
BILIRUBIN TOTAL: 0.6 mg/dL (ref 0.0–1.2)
BUN / CREAT RATIO: 12 (ref 9–20)
BUN: 10 mg/dL (ref 6–24)
CHLORIDE: 101 mmol/L (ref 96–106)
CO2: 25 mmol/L (ref 20–29)
Calcium: 9.4 mg/dL (ref 8.7–10.2)
Creatinine, Ser: 0.84 mg/dL (ref 0.76–1.27)
GFR calc non Af Amer: 106 mL/min/{1.73_m2} (ref >59)
GFR, EST AFRICAN AMERICAN: 123 mL/min/{1.73_m2} (ref >59)
GLUCOSE: 94 mg/dL (ref 65–99)
Globulin, Total: 3 g/dL (ref 1.5–4.5)
POTASSIUM: 4.8 mmol/L (ref 3.5–5.2)
Sodium: 141 mmol/L (ref 134–144)
TOTAL PROTEIN: 7.2 g/dL (ref 6.0–8.5)

## 2017-08-14 LAB — LIPID PANEL WITH LDL/HDL RATIO
Cholesterol, Total: 175 mg/dL (ref 100–199)
HDL: 39 mg/dL — AB (ref >39)
LDL Calculated: 77 mg/dL (ref 0–99)
LDL/HDL RATIO: 2 ratio (ref 0.0–3.6)
Triglycerides: 296 mg/dL — ABNORMAL HIGH (ref 0–149)
VLDL Cholesterol Cal: 59 mg/dL — ABNORMAL HIGH (ref 5–40)

## 2017-08-14 LAB — INSULIN, RANDOM: INSULIN: 17.3 u[IU]/mL (ref 2.6–24.9)

## 2017-08-14 LAB — HEMOGLOBIN A1C
ESTIMATED AVERAGE GLUCOSE: 126 mg/dL
HEMOGLOBIN A1C: 6 % — AB (ref 4.8–5.6)

## 2017-08-14 LAB — T3: T3, Total: 105 ng/dL (ref 71–180)

## 2017-08-14 LAB — VITAMIN D 25 HYDROXY (VIT D DEFICIENCY, FRACTURES): Vit D, 25-Hydroxy: 13.3 ng/mL — ABNORMAL LOW (ref 30.0–100.0)

## 2017-08-14 LAB — VITAMIN B12: VITAMIN B 12: 525 pg/mL (ref 232–1245)

## 2017-08-14 LAB — T4, FREE: FREE T4: 1.1 ng/dL (ref 0.82–1.77)

## 2017-08-17 NOTE — Progress Notes (Signed)
Office: [phone]  /  Fax: [phone]   Dear Dr. Zola Button,   Thank you for referring Jonathan Hansen to our clinic. The following note includes my evaluation and treatment recommendations.  HPI:   Chief Complaint: OBESITY    Jonathan Hansen has been referred by Donato Schultz, DO for consultation regarding his obesity and obesity related comorbidities.    Jonathan Hansen (MR# [id number]) is a 42 y.o. male who presents on 08/17/2017 for obesity evaluation and treatment. Current BMI is Body mass index is 42.73 kg/m.Jonathan Hansen has been struggling with his weight for many years and has been unsuccessful in either losing weight, maintaining weight loss, or reaching his healthy weight goal.     Jonathan Hansen attended our information session and states he is currently in the action stage of change and ready to dedicate time achieving and maintaining a healthier weight. Jonathan Hansen is interested in becoming our patient and working on intensive lifestyle modifications including (but not limited to) diet, exercise and weight loss. Jonathan Hansen's wife will be doing our program with him.    Jonathan Hansen states his family eats meals together he thinks his family will eat healthier with  him he struggles with family and or coworkers weight loss sabotage his desired weight loss is 100 lbs he started gaining weight after the [employer] at 42 yrs old his heaviest weight ever was 280 lbs. he has significant food cravings issues  he snacks frequently in the evenings he skips meals frequently he is frequently drinking liquids with calories he frequently makes poor food choices he frequently eats larger portions than normal  he has binge eating behaviors he struggles with emotional eating    Fatigue Jonathan Hansen feels his energy is lower than it should be. This has worsened with weight gain and has not worsened recently. Jonathan Hansen admits to daytime somnolence and admits to waking up still tired. Patient is at risk for  obstructive sleep apnea. Patent has a history of symptoms of daytime fatigue, morning fatigue and hypertension. Patient generally gets 4 or 5 hours of sleep per night, and states they generally have restless sleep. Snoring is present. Apneic episodes are not present. Epworth Sleepiness Score is 3  EKG was ordered today and shows left axis deviation, otherwise EKG is within normal limits.  Dyspnea on exertion Jonathan Hansen notes increasing shortness of breath with exercising and seems to be worsening over time with weight gain. He notes getting out of breath sooner with activity than he used to. This has not gotten worse recently. EKG was ordered today and shows left axis deviation, otherwise EKG is within normal limits. Jonathan Hansen denies orthopnea.  Hypertension Jonathan Hansen is a 42 y.o. male with hypertension. EKG was ordered today and shows left axis deviation, otherwise EKG is within normal limits. Jonathan Hansen denies chest pain or shortness of breath on exertion. He is working weight loss to help control his blood pressure with the goal of decreasing his risk of heart attack and stroke. Jonathan Hansen blood pressure is controlled with lisinopril.  At risk for cardiovascular disease Jonathan Hansen is at a higher than average risk for cardiovascular disease due to obesity and hypertension. He currently denies any chest pain.  Depression Screen Jonathan Hansen's Food and Mood (modified PHQ-9) score was  Depression screen PHQ 2/9 08/13/2017  Decreased Interest 3  Down, Depressed, Hopeless 1  PHQ - 2 Score 4  Altered sleeping 3  Tired, decreased energy 3  Change in appetite 1  Feeling bad or failure about yourself  1  Trouble concentrating 1  Moving slowly or fidgety/restless 3  Suicidal thoughts 0  PHQ-9 Score 16  Difficult doing work/chores Somewhat difficult    ALLERGIES: Allergies  Allergen Reactions  . Ibuprofen Other (See Comments)    Bleeding  . Penicillins     MEDICATIONS: Current Outpatient  Medications on File Prior to Visit  Medication Sig Dispense Refill  . HYDROcodone-acetaminophen (NORCO/VICODIN) 5-325 MG tablet Take 1 tablet by mouth every 6 (six) hours as needed for moderate pain. 45 tablet 0  . HYDROcodone-acetaminophen (NORCO/VICODIN) 5-325 MG tablet Take 1 tablet by mouth every 6 (six) hours as needed for moderate pain. 45 tablet 0  . HYDROcodone-acetaminophen (NORCO/VICODIN) 5-325 MG tablet Take 1 tablet by mouth every 6 (six) hours as needed for moderate pain. 45 tablet 0  . lisinopril (PRINIVIL,ZESTRIL) 20 MG tablet 2 po qd 180 tablet 3   No current facility-administered medications on file prior to visit.     PAST MEDICAL HISTORY: Past Medical History:  Diagnosis Date  . Arthritis   . Back pain   . Chest pain   . Dyspnea   . Elevated liver enzymes   . Fatty liver   . HLD (hyperlipidemia)   . HTN (hypertension)   . Joint pain   . Leg edema   . Osteoarthritis     PAST SURGICAL HISTORY: Past Surgical History:  Procedure Laterality Date  . KNEE ARTHROSCOPY    . VASECTOMY      SOCIAL HISTORY: Social History   Tobacco Use  . Smoking status: Current Every Day Smoker    Packs/day: 1.00    Years: 25.00    Pack years: 25.00    Types: Cigarettes  . Smokeless tobacco: Never Used  Substance Use Topics  . Alcohol use: Yes  . Drug use: Not on file    FAMILY HISTORY: Family History  Problem Relation Age of Onset  . Stroke Father   . Coronary artery disease Father        carotid stenosis    ROS: Review of Systems  Constitutional: Positive for malaise/fatigue.  HENT: Positive for congestion (nasal stuffiness).        Nasal Discharge  Eyes:       Wear Glasses or Contacts  Respiratory: Positive for cough, shortness of breath (on exertion) and wheezing.   Cardiovascular: Negative for chest pain and orthopnea.       Shortness of Breath with Activity  Gastrointestinal:       Rectal Bleeding  Musculoskeletal: Positive for back pain.       Muscle  or Joint Pain  Psychiatric/Behavioral: The patient has insomnia.     PHYSICAL EXAM: Blood pressure 121/81, pulse 71, temperature 97.8 F (36.6 C), temperature source Oral, height  (1.727 m), weight 281 lb (127.5 kg), SpO2 99 %. Body mass index is 42.73 kg/m. Physical Exam  Constitutional: He is oriented to person, place, and time. He appears well-developed and well-nourished.  HENT:  Head: Normocephalic and atraumatic.  Nose: Nose normal.  Eyes: EOM are normal. No scleral icterus.  Neck: Normal range of motion. Neck supple. No thyromegaly present.  Cardiovascular: Normal rate and regular rhythm.  Pulmonary/Chest: Effort normal. No respiratory distress.  Abdominal: Soft. There is no tenderness.  + obesity  Musculoskeletal: Normal range of motion.  Range of Motion is normal in all 4 extremities  Neurological: He is alert and oriented to person, place, and time. Coordination normal.  Skin: Skin is warm and dry.  Psychiatric: He has a  normal mood and affect. His behavior is normal.  Vitals reviewed.   RECENT LABS AND TESTS: BMET    Component Value Date/Time   NA 141 08/13/2017 1223   K 4.8 08/13/2017 1223   CL 101 08/13/2017 1223   CO2 25 08/13/2017 1223   GLUCOSE 94 08/13/2017 1223   GLUCOSE 133 (H) 05/19/2017 1554   BUN 10 08/13/2017 1223   CREATININE 0.84 08/13/2017 1223   CALCIUM 9.4 08/13/2017 1223   GFRNONAA 106 08/13/2017 1223   GFRAA 123 08/13/2017 1223   Lab Results  Component Value Date   HGBA1C 6.0 (H) 08/13/2017   Lab Results  Component Value Date   INSULIN 17.3 08/13/2017   CBC    Component Value Date/Time   WBC 5.3 08/13/2017 1223   WBC 6.0 11/11/2016 1757   RBC 4.66 08/13/2017 1223   RBC 4.57 11/11/2016 1757   HGB 14.7 08/13/2017 1223   HCT 43.2 08/13/2017 1223   PLT 216.0 11/11/2016 1757   MCV 93 08/13/2017 1223   MCH 31.5 08/13/2017 1223   MCHC 34.0 08/13/2017 1223   MCHC 34.6 11/11/2016 1757   RDW 14.0 08/13/2017 1223   LYMPHSABS  1.9 08/13/2017 1223   MONOABS 0.9 11/11/2016 1757   EOSABS 0.1 08/13/2017 1223   BASOSABS 0.0 08/13/2017 1223   Iron/TIBC/Ferritin/ %Sat No results found for: IRON, TIBC, FERRITIN, IRONPCTSAT Lipid Panel     Component Value Date/Time   CHOL 175 08/13/2017 1223   TRIG 296 (H) 08/13/2017 1223   HDL 39 (L) 08/13/2017 1223   CHOLHDL 5 05/19/2017 1554   VLDL 77.6 (H) 05/19/2017 1554   LDLCALC 77 08/13/2017 1223   LDLDIRECT 46.0 05/19/2017 1554   Hepatic Function Panel     Component Value Date/Time   PROT 7.2 08/13/2017 1223   ALBUMIN 4.2 08/13/2017 1223   AST 46 (H) 08/13/2017 1223   ALT 69 (H) 08/13/2017 1223   ALKPHOS 45 08/13/2017 1223   BILITOT 0.6 08/13/2017 1223      Component Value Date/Time   TSH 1.570 08/13/2017 1223    ECG  shows NSR with a rate of 74 BPM INDIRECT CALORIMETER done today shows a VO2 of 387 and a REE of 2691.  His calculated basal metabolic rate is 1610 thus his basal metabolic rate is better than expected.    ASSESSMENT AND PLAN: Other fatigue - Plan: EKG 12-Lead, CBC With Differential, Hemoglobin A1c, Insulin, random, Lipid Panel With LDL/HDL Ratio, VITAMIN D 25 Hydroxy (Vit-D Deficiency, Fractures), Vitamin B12, Folate, TSH, T4, free, T3  Essential hypertension - Plan: Comprehensive metabolic panel  Dyspnea on exertion  Depression screening  At risk for heart disease  Class 3 severe obesity with serious comorbidity and body mass index (BMI) of 40.0 to 44.9 in adult, unspecified obesity type (HCC)  PLAN: Fatigue Jonathan Hansen was informed that his fatigue may be related to obesity, depression or many other causes. Labs will be ordered, and in the meanwhile Jonathan Hansen has agreed to work on diet, exercise and weight loss to help with fatigue. Proper sleep hygiene was discussed including the need for 7-8 hours of quality sleep each night. A sleep study was not ordered based on symptoms and Epworth score.  Dyspnea on exertion Curby's shortness of  breath appears to be obesity related and exercise induced. He has agreed to work on weight loss and gradually increase exercise to treat his exercise induced shortness of breath. If Jonathan Hansen follows our instructions and loses weight without improvement of his shortness  of breath, we will plan to refer to pulmonology. We will monitor this condition regularly. Jonathan Hansen agrees to this plan.  Hypertension We discussed sodium restriction, working on healthy weight loss, and a regular exercise program as the means to achieve improved blood pressure control. Jonathan Hansen agreed with this plan and agreed to follow up as directed. We will order labs and EKG today and will continue to monitor his blood pressure as well as his progress with the above lifestyle modifications. He will continue his medications as prescribed and will watch for signs of hypotension as he continues his lifestyle modifications.   Cardiovascular risk counseling Jonathan Hansen was given extended (15 minutes) coronary artery disease prevention counseling today. He is 42 y.o. male and has risk factors for heart disease including obesity and hypertension. We discussed intensive lifestyle modifications today with an emphasis on specific weight loss instructions and strategies. Pt was also informed of the importance of increasing exercise and decreasing saturated fats to help prevent heart disease.  Depression Screen Jonathan Hansen had a strongly positive depression screening. Depression is commonly associated with obesity and often results in emotional eating behaviors. We will monitor this closely and work on CBT to help improve the non-hunger eating patterns. Referral to Psychology may be required if no improvement is seen as he continues in our clinic.  Obesity Azai is currently in the action stage of change and his goal is to continue with weight loss efforts. I recommend Jonathan Hansen begin the structured treatment plan as follows:  He has agreed to follow the  Category 4 plan Kaleth has been instructed to eventually work up to a goal of 150 minutes of combined cardio and strengthening exercise per week for weight loss and overall health benefits. We discussed the following Behavioral Modification Strategies today: increase H2O intake, planning for success, increasing lean protein intake, decrease eating out, work on meal planning and easy cooking plans and travel eating strategies    He was informed of the importance of frequent follow up visits to maximize his success with intensive lifestyle modifications for his multiple health conditions. He was informed we would discuss his lab results at his next visit unless there is a critical issue that needs to be addressed sooner. Hallis agreed to keep his next visit at the agreed upon time to discuss these results.    OBESITY BEHAVIORAL INTERVENTION VISIT  Today's visit was # 1 out of 22.  Starting weight: 281 lbs Starting date: 08/13/17 Today's weight : 281 lbs Today's date: 08/13/2017 Total lbs lost to date: 0 (Patients must lose 7 lbs in the first 6 months to continue with counseling)   ASK: We discussed the diagnosis of obesity with Mittie Bodo today and Jonathan Hansen agreed to give [place] permission to discuss obesity behavioral modification therapy today.  ASSESS: Brendin has the diagnosis of obesity and his BMI today is 42.74 Tuvia is in the action stage of change   ADVISE: Greogory was educated on the multiple health risks of obesity as well as the benefit of weight loss to improve his health. He was advised of the need for long term treatment and the importance of lifestyle modifications.  AGREE: Multiple dietary modification options and treatment options were discussed and  Ozie agreed to the above obesity treatment plan.   I, Nevada Crane, am acting as transcriptionist for  Filbert Schilder, MD   I have reviewed the above documentation for accuracy and completeness, and I  agree with the above. - Debbra Riding, MD

## 2017-08-24 ENCOUNTER — Telehealth: Payer: Self-pay | Admitting: Family Medicine

## 2017-08-24 ENCOUNTER — Encounter: Payer: Self-pay | Admitting: Family Medicine

## 2017-08-24 ENCOUNTER — Other Ambulatory Visit: Payer: 59

## 2017-08-24 NOTE — Telephone Encounter (Signed)
Copied from CRM [id number]. Topic: Quick Communication - Rx Refill/Question >> Aug 24, 2017  1:17 PM Leafy Ro wrote: Medication: hydrocodone Has the patient contacted their pharmacy?   No Preferred Pharmacy  [facility]  on [address] Agent: Please be advised that RX refills may take up to 3 business days. We ask that you follow-up with your pharmacy. Pt states the office needs to call [employer] to get full 45 pills a month

## 2017-08-24 NOTE — Telephone Encounter (Signed)
Copied from CRM [id number]. Topic: Quick Communication - See Telephone Encounter >> Aug 24, 2017  7:08 AM Valentina Lucks wrote: CRM for notification. See Telephone encounter for: 08/24/17.   Pt came in office wanting to know if it is time for him to have a UDS done for his control substances. If so please put lab orders. Please advise pt.

## 2017-08-25 ENCOUNTER — Other Ambulatory Visit: Payer: Self-pay

## 2017-08-25 ENCOUNTER — Other Ambulatory Visit: Payer: Self-pay | Admitting: Family Medicine

## 2017-08-25 DIAGNOSIS — Z79899 Other long term (current) drug therapy: Secondary | ICD-10-CM

## 2017-08-25 DIAGNOSIS — M25562 Pain in left knee: Principal | ICD-10-CM

## 2017-08-25 DIAGNOSIS — M25561 Pain in right knee: Principal | ICD-10-CM

## 2017-08-25 DIAGNOSIS — G8929 Other chronic pain: Secondary | ICD-10-CM

## 2017-08-25 MED ORDER — HYDROCODONE-ACETAMINOPHEN 5-325 MG PO TABS: 1.0000 | ORAL_TABLET | Freq: Four times a day (QID) | ORAL | 0 refills | Status: DC | PRN

## 2017-08-25 NOTE — Telephone Encounter (Signed)
Lab orders have been placed and patient has been scheduled for 08/27/17 at 7:00 am

## 2017-08-25 NOTE — Telephone Encounter (Signed)
Patient coming in on 08/27/17 for UDS, would like to know if you can supply him with pain medication until he comes in, he states he is almost out.

## 2017-08-25 NOTE — Telephone Encounter (Signed)
Refill x 1 done

## 2017-08-26 ENCOUNTER — Telehealth: Payer: Self-pay

## 2017-08-26 NOTE — Telephone Encounter (Signed)
PA initiated via [employer]; KEY: [id number]. Received real-time PA approval. PA Case ID: [id number]

## 2017-08-27 ENCOUNTER — Other Ambulatory Visit (INDEPENDENT_AMBULATORY_CARE_PROVIDER_SITE_OTHER): Payer: 59

## 2017-08-27 ENCOUNTER — Ambulatory Visit (INDEPENDENT_AMBULATORY_CARE_PROVIDER_SITE_OTHER): Payer: 59 | Admitting: Family Medicine

## 2017-08-27 DIAGNOSIS — Z79899 Other long term (current) drug therapy: Secondary | ICD-10-CM

## 2017-08-30 LAB — PAIN MGMT, PROFILE 8 W/CONF, U
6 Acetylmorphine: NEGATIVE ng/mL (ref <10)
AMPHETAMINES: NEGATIVE ng/mL (ref <500)
Alcohol Metabolites: NEGATIVE ng/mL (ref <500)
BUPRENORPHINE, URINE: NEGATIVE ng/mL (ref <5)
Benzodiazepines: NEGATIVE ng/mL (ref <100)
CODEINE: NEGATIVE ng/mL (ref <50)
CREATININE: 155.4 mg/dL
Cocaine Metabolite: NEGATIVE ng/mL (ref <150)
HYDROCODONE: 332 ng/mL — AB (ref <50)
HYDROMORPHONE: 308 ng/mL — AB (ref <50)
MARIJUANA METABOLITE: NEGATIVE ng/mL (ref <20)
MDMA: NEGATIVE ng/mL (ref <500)
Morphine: NEGATIVE ng/mL (ref <50)
Norhydrocodone: 328 ng/mL — ABNORMAL HIGH (ref <50)
OPIATES: POSITIVE ng/mL — AB (ref <100)
OXYCODONE: NEGATIVE ng/mL (ref <100)
Oxidant: NEGATIVE ug/mL (ref <200)
pH: 5.86 (ref 4.5–9.0)

## 2017-08-31 ENCOUNTER — Ambulatory Visit (INDEPENDENT_AMBULATORY_CARE_PROVIDER_SITE_OTHER): Payer: 59 | Admitting: Family Medicine

## 2017-08-31 VITALS — BP 112/78 | HR 78 | Temp 97.9°F | Ht 68.0 in | Wt 273.0 lb

## 2017-08-31 DIAGNOSIS — Z9189 Other specified personal risk factors, not elsewhere classified: Secondary | ICD-10-CM

## 2017-08-31 DIAGNOSIS — Z6841 Body Mass Index (BMI) 40.0 and over, adult: Secondary | ICD-10-CM

## 2017-08-31 DIAGNOSIS — R945 Abnormal results of liver function studies: Secondary | ICD-10-CM

## 2017-08-31 DIAGNOSIS — I10 Essential (primary) hypertension: Secondary | ICD-10-CM

## 2017-08-31 DIAGNOSIS — E559 Vitamin D deficiency, unspecified: Secondary | ICD-10-CM | POA: Diagnosis not present

## 2017-08-31 DIAGNOSIS — R7989 Other specified abnormal findings of blood chemistry: Secondary | ICD-10-CM

## 2017-08-31 DIAGNOSIS — R7303 Prediabetes: Secondary | ICD-10-CM

## 2017-09-01 ENCOUNTER — Encounter (INDEPENDENT_AMBULATORY_CARE_PROVIDER_SITE_OTHER): Payer: Self-pay | Admitting: Family Medicine

## 2017-09-01 MED ORDER — VITAMIN D (ERGOCALCIFEROL) 1.25 MG (50000 UNIT) PO CAPS: 50000.0000 [IU] | ORAL_CAPSULE | ORAL | 0 refills | Status: DC

## 2017-09-01 MED ORDER — METFORMIN HCL 500 MG PO TABS: 500.0000 mg | ORAL_TABLET | Freq: Every day | ORAL | 0 refills | Status: DC

## 2017-09-01 NOTE — Progress Notes (Signed)
Office: [phone]  /  Fax: [phone]   HPI:   Chief Complaint: OBESITY Jonathan Hansen is here to discuss his progress with his obesity treatment plan. He is on the Category 4 plan and is following his eating plan approximately 90 % of the time. He states he is exercising 0 minutes 0 times per week. Jonathan Hansen is occasionally needing to stop at [facility] for work and is getting a [place] wrap. His weight is 273 lb (123.8 kg) today and has had a weight loss of 8 pounds over a period of 2 weeks since his last visit. He has lost 8 lbs since starting treatment with [place].  Vitamin D deficiency Jonathan Hansen has a diagnosis of vitamin D deficiency. He is not currently taking vit D and denies nausea, vomiting or muscle weakness.  Elevated LFT Jonathan Hansen has a new dx of elevated ALT. His BMI is over 40 and he has a history of fatty liver disease over 20 years ago. He denies abdominal pain or jaundice. He denies excessive alcohol intake.  Hypertension Jonathan Hansen is a 42 y.o. male with hypertension. Jonathan Hansen denies lightheadedness or dizziness. He is working weight loss to help control his blood pressure with the goal of decreasing his risk of heart attack and stroke. Jonathan Hansen blood pressure is well controlled today.  Pre-Diabetes Jonathan Hansen has a diagnosis of prediabetes based on his elevated Hgb A1c of 6.0 and elevated fasting insulin of 17.3. Jonathan Hansen was informed this puts him at greater risk of developing diabetes. He is not taking metformin currently and continues to work on diet and exercise to decrease risk of diabetes. He denies nausea or hypoglycemia.  At risk for diabetes Jonathan Hansen is at higher than average risk for developing diabetes due to his obesity. He currently denies polyuria or polydipsia.  ALLERGIES: Allergies  Allergen Reactions  . Ibuprofen Other (See Comments)    Bleeding  . Penicillins     MEDICATIONS: Current Outpatient Medications on File Prior to Visit  Medication Sig  Dispense Refill  . HYDROcodone-acetaminophen (NORCO/VICODIN) 5-325 MG tablet Take 1 tablet by mouth every 6 (six) hours as needed for moderate pain. 45 tablet 0  . HYDROcodone-acetaminophen (NORCO/VICODIN) 5-325 MG tablet Take 1 tablet by mouth every 6 (six) hours as needed for moderate pain. 45 tablet 0  . HYDROcodone-acetaminophen (NORCO/VICODIN) 5-325 MG tablet Take 1 tablet by mouth every 6 (six) hours as needed for moderate pain. 45 tablet 0  . lisinopril (PRINIVIL,ZESTRIL) 20 MG tablet 2 po qd 180 tablet 3   No current facility-administered medications on file prior to visit.     PAST MEDICAL HISTORY: Past Medical History:  Diagnosis Date  . Arthritis   . Back pain   . Chest pain   . Dyspnea   . Elevated liver enzymes   . Fatty liver   . HLD (hyperlipidemia)   . HTN (hypertension)   . Joint pain   . Leg edema   . Osteoarthritis     PAST SURGICAL HISTORY: Past Surgical History:  Procedure Laterality Date  . KNEE ARTHROSCOPY    . VASECTOMY      SOCIAL HISTORY: Social History   Tobacco Use  . Smoking status: Current Every Day Smoker    Packs/day: 1.00    Years: 25.00    Pack years: 25.00    Types: Cigarettes  . Smokeless tobacco: Never Used  Substance Use Topics  . Alcohol use: Yes  . Drug use: Not on file    FAMILY HISTORY: Family History  Problem Relation Age of Onset  . Stroke Father   . Coronary artery disease Father        carotid stenosis    ROS: Review of Systems  Constitutional: Positive for weight loss.  Gastrointestinal: Negative for abdominal pain, nausea and vomiting.  Genitourinary: Negative for frequency.  Musculoskeletal:       Negative for muscle weakness  Skin:       Negative for jaundice  Neurological: Negative for dizziness.       Negative for lightheadedness  Endo/Heme/Allergies: Negative for polydipsia.       Negative for hypoglycemia    PHYSICAL EXAM: Blood pressure 112/78, pulse 78, temperature 97.9 F (36.6 C),  temperature source Oral, height  (1.727 m), weight 273 lb (123.8 kg), SpO2 99 %. Body mass index is 41.51 kg/m. Physical Exam  Constitutional: He is oriented to person, place, and time. He appears well-developed and well-nourished.  Cardiovascular: Normal rate.  Pulmonary/Chest: Effort normal.  Musculoskeletal: Normal range of motion.  Neurological: He is oriented to person, place, and time.  Skin: Skin is warm and dry.  Psychiatric: He has a normal mood and affect. His behavior is normal.  Vitals reviewed.   RECENT LABS AND TESTS: BMET    Component Value Date/Time   NA 141 08/13/2017 1223   K 4.8 08/13/2017 1223   CL 101 08/13/2017 1223   CO2 25 08/13/2017 1223   GLUCOSE 94 08/13/2017 1223   GLUCOSE 133 (H) 05/19/2017 1554   BUN 10 08/13/2017 1223   CREATININE 0.84 08/13/2017 1223   CALCIUM 9.4 08/13/2017 1223   GFRNONAA 106 08/13/2017 1223   GFRAA 123 08/13/2017 1223   Lab Results  Component Value Date   HGBA1C 6.0 (H) 08/13/2017   Lab Results  Component Value Date   INSULIN 17.3 08/13/2017   CBC    Component Value Date/Time   WBC 5.3 08/13/2017 1223   WBC 6.0 11/11/2016 1757   RBC 4.66 08/13/2017 1223   RBC 4.57 11/11/2016 1757   HGB 14.7 08/13/2017 1223   HCT 43.2 08/13/2017 1223   PLT 216.0 11/11/2016 1757   MCV 93 08/13/2017 1223   MCH 31.5 08/13/2017 1223   MCHC 34.0 08/13/2017 1223   MCHC 34.6 11/11/2016 1757   RDW 14.0 08/13/2017 1223   LYMPHSABS 1.9 08/13/2017 1223   MONOABS 0.9 11/11/2016 1757   EOSABS 0.1 08/13/2017 1223   BASOSABS 0.0 08/13/2017 1223   Iron/TIBC/Ferritin/ %Sat No results found for: IRON, TIBC, FERRITIN, IRONPCTSAT Lipid Panel     Component Value Date/Time   CHOL 175 08/13/2017 1223   TRIG 296 (H) 08/13/2017 1223   HDL 39 (L) 08/13/2017 1223   CHOLHDL 5 05/19/2017 1554   VLDL 77.6 (H) 05/19/2017 1554   LDLCALC 77 08/13/2017 1223   LDLDIRECT 46.0 05/19/2017 1554   Hepatic Function Panel     Component Value  Date/Time   PROT 7.2 08/13/2017 1223   ALBUMIN 4.2 08/13/2017 1223   AST 46 (H) 08/13/2017 1223   ALT 69 (H) 08/13/2017 1223   ALKPHOS 45 08/13/2017 1223   BILITOT 0.6 08/13/2017 1223      Component Value Date/Time   TSH 1.570 08/13/2017 1223   Results for DONOVON, MICHELETTI (MRN [id number]) as of 09/01/2017 13:11  Ref. Range 08/13/2017 12:23  Vitamin D, 25-Hydroxy Latest Ref Range: 30.0 - 100.0 ng/mL 13.3 (L)   ASSESSMENT AND PLAN: Elevated LFTs  Vitamin D deficiency - Plan: Vitamin D, Ergocalciferol, (DRISDOL) 50000 units CAPS capsule  Prediabetes - Plan: metFORMIN (GLUCOPHAGE) 500 MG tablet  Essential hypertension  At risk for diabetes mellitus  Class 3 severe obesity with serious comorbidity and body mass index (BMI) of 40.0 to 44.9 in adult, unspecified obesity type (HCC)  PLAN:  Vitamin D Deficiency Jonathan Hansen was informed that low vitamin D levels contributes to fatigue and are associated with obesity, breast, and colon cancer. He agrees to start to take prescription Vit D ,000 IU every week #4 with no refills . We will retest labs in 3 months and he will follow up for routine testing of vitamin D, at least 2-3 times per year. He was informed of the risk of over-replacement of vitamin D and agrees to not increase his dose unless he discusses this with [place] first. Jonathan Hansen agrees to follow up with our clinic in 2 weeks.  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 Jonathan Hansen was educated on his risk of developing NASH or even liver failure and th only proven treatment for NAFLD was weight loss. Jonathan Hansen agreed to continue with his weight loss efforts with healthier diet and exercise as an essential part of his treatment plan. We will follow up LFT in 3 months and if still elevated, we will order an ultrasound.  Hypertension We discussed sodium restriction, working on healthy weight loss, and a  regular exercise program as the means to achieve improved blood pressure control. Jonathan Hansen agreed with this plan and agreed to follow up as directed. Jonathan Hansen is to follow up with blood pressure this evening. We may need to decrease Lisinopril. We will continue to monitor his blood pressure as well as his progress with the above lifestyle modifications. He will continue his medications as prescribed and will watch for signs of hypotension as he continues his lifestyle modifications.  Pre-Diabetes Jonathan Hansen will continue to work on weight loss, exercise, and decreasing simple carbohydrates in his diet to help decrease the risk of diabetes. We dicussed metformin including benefits and risks. He was informed that eating too many simple carbohydrates or too many calories at one sitting increases the likelihood of GI side effects. Jonathan Hansen agreed to start Metformin 500 mg qAM #30 with no refills and follow up with [place] as directed to monitor his progress.  Diabetes risk counseling Jonathan Hansen was given extended (30 minutes) diabetes prevention counseling today. He is 42 y.o. male and has risk factors for diabetes including obesity and pre-diabetes. We discussed intensive lifestyle modifications today with an emphasis on weight loss as well as increasing exercise and decreasing simple carbohydrates in his diet.  Obesity Jonathan Hansen is currently in the action stage of change. As such, his goal is to continue with weight loss efforts He has agreed to follow the Category 4 plan Jonathan Hansen has been instructed to work up to a goal of 150 minutes of combined cardio and strengthening exercise per week for weight loss and overall health benefits. We discussed the following Behavioral Modification Strategies today: better snacking choices, planning for success, increasing lean protein intake, increasing vegetables and work on meal planning and easy cooking plans  Jonathan Hansen has agreed to follow up with our clinic in 2 weeks. He was  informed of the importance of frequent follow up visits to maximize his success with intensive lifestyle modifications for his multiple health conditions.   OBESITY BEHAVIORAL INTERVENTION VISIT  Today's visit was # 2 out of 22.  Starting weight: 281 lbs Starting date: 08/13/17  Today's weight : 273 lbs Today's date: 08/31/2017 Total lbs lost to date: 8 (Patients must lose 7 lbs in the first 6 months to continue with counseling)   ASK: We discussed the diagnosis of obesity with Jonathan Hansen today and Jonathan Hansen agreed to give [place] permission to discuss obesity behavioral modification therapy today.  ASSESS: Jonathan Hansen has the diagnosis of obesity and his BMI today is 41.52 Jonathan Hansen is in the action stage of change   ADVISE: Jonathan Hansen was educated on the multiple health risks of obesity as well as the benefit of weight loss to improve his health. He was advised of the need for long term treatment and the importance of lifestyle modifications.  AGREE: Multiple dietary modification options and treatment options were discussed and  Newton agreed to the above obesity treatment plan.  I, [place] Crane, am acting as transcriptionist for Filbert Schilder, MD  I have reviewed the above documentation for accuracy and completeness, and I agree with the above. - Debbra Riding, MD

## 2017-09-15 ENCOUNTER — Encounter (INDEPENDENT_AMBULATORY_CARE_PROVIDER_SITE_OTHER): Payer: Self-pay

## 2017-09-21 ENCOUNTER — Ambulatory Visit (INDEPENDENT_AMBULATORY_CARE_PROVIDER_SITE_OTHER): Payer: 59 | Admitting: Family Medicine

## 2017-09-30 ENCOUNTER — Other Ambulatory Visit: Payer: Self-pay | Admitting: Family Medicine

## 2017-09-30 DIAGNOSIS — G8929 Other chronic pain: Secondary | ICD-10-CM

## 2017-09-30 DIAGNOSIS — M25562 Pain in left knee: Principal | ICD-10-CM

## 2017-09-30 DIAGNOSIS — M25561 Pain in right knee: Principal | ICD-10-CM

## 2017-09-30 NOTE — Telephone Encounter (Signed)
Copied from CRM [phone]. Topic: Quick Communication - See Telephone Encounter >> Sep 30, 2017  5:33 PM Trula SladeWalter, Linda F wrote: CRM for notification. See Telephone encounter for: 09/30/17. Patient would like a refill on his HYDROcodone-acetaminophen (NORCO/VICODIN) 5-325 MG tablets and have them sent to his preferred pharmacy [facility] on [address] [place].  Patient would like 3 prescriptions to cover 3 months, if possible.

## 2017-10-01 ENCOUNTER — Other Ambulatory Visit (INDEPENDENT_AMBULATORY_CARE_PROVIDER_SITE_OTHER): Payer: Self-pay | Admitting: Family Medicine

## 2017-10-01 ENCOUNTER — Encounter: Payer: Self-pay | Admitting: Family Medicine

## 2017-10-01 DIAGNOSIS — E559 Vitamin D deficiency, unspecified: Secondary | ICD-10-CM

## 2017-10-01 DIAGNOSIS — R7303 Prediabetes: Secondary | ICD-10-CM

## 2017-10-01 MED ORDER — HYDROCODONE-ACETAMINOPHEN 5-325 MG PO TABS: 1.0000 | ORAL_TABLET | Freq: Four times a day (QID) | ORAL | 0 refills | Status: DC | PRN

## 2017-10-01 NOTE — Telephone Encounter (Signed)
Rx refill request : hydrocodone-acetaminophen 5-325 mg       Last filled: 08/25/17 # 45  LOV: 08/25/17  PCP: [name]  Pharmacy: verified

## 2017-10-01 NOTE — Telephone Encounter (Signed)
Database ran and is on your desk for review.  Last filled per database: 08/30/17 Last written: 08/25/17 Last ov: 05/19/17 Next ov: 11/16/17 Contract: can get at 11/16/17 UDS: can get at 11/16/17  Has has to follow up every 3 months.  Can update him again at 11/16/17 visit.

## 2017-10-05 ENCOUNTER — Ambulatory Visit (INDEPENDENT_AMBULATORY_CARE_PROVIDER_SITE_OTHER): Payer: 59 | Admitting: Family Medicine

## 2017-10-05 ENCOUNTER — Encounter (INDEPENDENT_AMBULATORY_CARE_PROVIDER_SITE_OTHER): Payer: Self-pay

## 2017-10-05 ENCOUNTER — Ambulatory Visit (INDEPENDENT_AMBULATORY_CARE_PROVIDER_SITE_OTHER): Payer: 59 | Admitting: Physician Assistant

## 2017-10-05 VITALS — BP 154/89 | HR 72 | Temp 97.9°F | Ht 68.0 in | Wt 263.0 lb

## 2017-10-05 DIAGNOSIS — I10 Essential (primary) hypertension: Secondary | ICD-10-CM | POA: Diagnosis not present

## 2017-10-05 DIAGNOSIS — Z6841 Body Mass Index (BMI) 40.0 and over, adult: Secondary | ICD-10-CM | POA: Diagnosis not present

## 2017-10-06 NOTE — Progress Notes (Signed)
Office: [phone]  /  Fax: [phone]   HPI:   Chief Complaint: OBESITY Jonathan Hansen is here to discuss his progress with his obesity treatment plan. He is on the Category 4 plan and is following his eating plan approximately 90 % of the time. He states he is exercising 0 minutes 0 times per week. Jonathan Hansen continues to do well with weight loss. He is mindful of his eating and states his hunger is well controlled. His weight is 263 lb (119.3 kg) today and has had a weight loss of 10 pounds over a period of 5 weeks since his last visit. He has lost 18 lbs since starting treatment with Jonathan Hansen.  Hypertension Jonathan Hansen is a 42 y.o. male with hypertension. His blood Hansen is elevated today at 154/89. His Lisinopril was recently decreased to one pill daily, however since blood Hansen is high today, patient is instructed to increase dose back to 2 pills per day. Jonathan Hansen denies chest pain or shortness of breath on exertion. He is working weight loss to help control his blood Hansen with the goal of decreasing his risk of heart attack and stroke. Jonathan Hansen is not currently controlled.  ALLERGIES: Allergies  Allergen Reactions  . Ibuprofen Other (See Comments)    Bleeding  . Penicillins     MEDICATIONS: Current Outpatient Medications on File Prior to Visit  Medication Sig Dispense Refill  . HYDROcodone-acetaminophen (NORCO/VICODIN) 5-325 MG tablet Take 1 tablet by mouth every 6 (six) hours as needed for moderate pain. 45 tablet 0  . HYDROcodone-acetaminophen (NORCO/VICODIN) 5-325 MG tablet Take 1 tablet by mouth every 6 (six) hours as needed for moderate pain. 45 tablet 0  . HYDROcodone-acetaminophen (NORCO/VICODIN) 5-325 MG tablet Take 1 tablet by mouth every 6 (six) hours as needed for moderate pain. 45 tablet 0  . lisinopril (PRINIVIL,ZESTRIL) 20 MG tablet 2 po qd 180 tablet 3  . metFORMIN (GLUCOPHAGE) 500 MG tablet TAKE 1 TABLET BY MOUTH EVERY DAY WITH  BREAKFAST 90 tablet 0  . Vitamin D, Ergocalciferol, (DRISDOL) 50000 units CAPS capsule TAKE 1 CAPSULE (50,000 UNITS TOTAL) BY MOUTH EVERY 7 (SEVEN) DAYS. 4 capsule 0   No current facility-administered medications on file prior to visit.     PAST MEDICAL HISTORY: Past Medical History:  Diagnosis Date  . Arthritis   . Back pain   . Chest pain   . Dyspnea   . Elevated liver enzymes   . Fatty liver   . HLD (hyperlipidemia)   . HTN (hypertension)   . Joint pain   . Leg edema   . Osteoarthritis     PAST SURGICAL HISTORY: Past Surgical History:  Procedure Laterality Date  . KNEE ARTHROSCOPY    . VASECTOMY      SOCIAL HISTORY: Social History   Tobacco Use  . Smoking status: Current Every Day Smoker    Packs/day: 1.00    Years: 25.00    Pack years: 25.00    Types: Cigarettes  . Smokeless tobacco: Never Used  Substance Use Topics  . Alcohol use: Yes  . Drug use: Not on file    FAMILY HISTORY: Family History  Problem Relation Age of Onset  . Stroke Father   . Coronary artery disease Father        carotid stenosis    ROS: Review of Systems  Constitutional: Positive for weight loss.  Respiratory: Negative for shortness of breath (on exertion).   Cardiovascular: Negative for chest pain.    PHYSICAL  EXAM: Blood Hansen (!) 154/89, pulse 72, temperature 97.9 F (36.6 C), temperature source Oral, height 5\' 8"  (1.727 m), weight 263 lb (119.3 kg), SpO2 97 %. Body mass index is 39.99 kg/m. Physical Exam  Constitutional: He is oriented to person, place, and time. He appears well-developed and well-nourished.  Cardiovascular: Normal rate.  Pulmonary/Chest: Effort normal.  Musculoskeletal: Normal range of motion.  Neurological: He is oriented to person, place, and time.  Skin: Skin is warm and dry.  Psychiatric: He has a normal mood and affect. His behavior is normal.  Vitals reviewed.   RECENT LABS AND TESTS: BMET    Component Value Date/Time   NA 141  08/13/2017 1223   K 4.8 08/13/2017 1223   CL 101 08/13/2017 1223   CO2 25 08/13/2017 1223   GLUCOSE 94 08/13/2017 1223   GLUCOSE 133 (H) 05/19/2017 1554   BUN 10 08/13/2017 1223   CREATININE 0.84 08/13/2017 1223   CALCIUM 9.4 08/13/2017 1223   GFRNONAA 106 08/13/2017 1223   GFRAA 123 08/13/2017 1223   Lab Results  Component Value Date   HGBA1C 6.0 (H) 08/13/2017   Lab Results  Component Value Date   INSULIN 17.3 08/13/2017   CBC    Component Value Date/Time   WBC 5.3 08/13/2017 1223   WBC 6.0 11/11/2016 1757   RBC 4.66 08/13/2017 1223   RBC 4.57 11/11/2016 1757   HGB 14.7 08/13/2017 1223   HCT 43.2 08/13/2017 1223   PLT 216.0 11/11/2016 1757   MCV 93 08/13/2017 1223   MCH 31.5 08/13/2017 1223   MCHC 34.0 08/13/2017 1223   MCHC 34.6 11/11/2016 1757   RDW 14.0 08/13/2017 1223   LYMPHSABS 1.9 08/13/2017 1223   MONOABS 0.9 11/11/2016 1757   EOSABS 0.1 08/13/2017 1223   BASOSABS 0.0 08/13/2017 1223   Iron/TIBC/Ferritin/ %Sat No results found for: IRON, TIBC, FERRITIN, IRONPCTSAT Lipid Panel     Component Value Date/Time   CHOL 175 08/13/2017 1223   TRIG 296 (H) 08/13/2017 1223   HDL 39 (L) 08/13/2017 1223   CHOLHDL 5 05/19/2017 1554   VLDL 77.6 (H) 05/19/2017 1554   LDLCALC 77 08/13/2017 1223   LDLDIRECT 46.0 05/19/2017 1554   Hepatic Function Panel     Component Value Date/Time   PROT 7.2 08/13/2017 1223   ALBUMIN 4.2 08/13/2017 1223   AST 46 (H) 08/13/2017 1223   ALT 69 (H) 08/13/2017 1223   ALKPHOS 45 08/13/2017 1223   BILITOT 0.6 08/13/2017 1223      Component Value Date/Time   TSH 1.570 08/13/2017 1223   Results for BAYRON, DALTO (MRN [id number]) as of 10/06/2017 09:35  Ref. Range 08/13/2017 12:23  Vitamin D, 25-Hydroxy Latest Ref Range: 30.0 - 100.0 ng/mL 13.3 (L)   ASSESSMENT AND PLAN: Essential hypertension  Class 3 severe obesity with serious comorbidity and body mass index (BMI) of 40.0 to 44.9 in adult, unspecified obesity type  (HCC)  PLAN:  Hypertension We discussed sodium restriction, working on healthy weight loss, and a regular exercise program as the means to achieve improved blood Hansen control. Jonathan Hansen agreed with this plan and agreed to follow up as directed. We will continue to monitor his blood Hansen as well as his progress with the above lifestyle modifications. He agrees to continue Lisinopril 20 mg 2 pills daily and will watch for signs of hypotension as he continues his lifestyle modifications.  We spent > than 50% of the 15 minute visit on the counseling as documented in the  note.  Obesity Jonathan Hansen is currently in the action stage of change. As such, his goal is to continue with weight loss efforts He has agreed to follow the Category 4 plan Jonathan Hansen has been instructed to work up to a goal of 150 minutes of combined cardio and strengthening exercise per week for weight loss and overall health benefits. We discussed the following Behavioral Modification Strategies today: increasing lean protein intake and work on meal planning and easy cooking plans  Jonathan Hansen has agreed to follow up with our clinic in 2 weeks. He was informed of the importance of frequent follow up visits to maximize his success with intensive lifestyle modifications for his multiple health conditions.   OBESITY BEHAVIORAL INTERVENTION VISIT  Today's visit was # 3 out of 22.  Starting weight: 281 lbs Starting date: 08/13/17 Today's weight : 263 lbs  Today's date: 10/05/2017 Total lbs lost to date: 25 (Patients must lose 7 lbs in the first 6 months to continue with counseling)   ASK: We discussed the diagnosis of obesity with Jonathan Bodo today and Jonathan Hansen agreed to give [place] permission to discuss obesity behavioral modification therapy today.  ASSESS: Dardan has the diagnosis of obesity and his BMI today is 63 Jonathan Hansen is in the action stage of change   ADVISE: Jonathan Hansen was educated on the multiple health risks of  obesity as well as the benefit of weight loss to improve his health. He was advised of the need for long term treatment and the importance of lifestyle modifications.  AGREE: Multiple dietary modification options and treatment options were discussed and  Jonathan Hansen agreed to the above obesity treatment plan.   Cristi Loron, am acting as Energy manager for [employer], PA-C I, Illa Level [facility], have reviewed this note and agree with its content

## 2017-10-20 ENCOUNTER — Ambulatory Visit (INDEPENDENT_AMBULATORY_CARE_PROVIDER_SITE_OTHER): Payer: 59 | Admitting: Physician Assistant

## 2017-10-20 ENCOUNTER — Encounter (INDEPENDENT_AMBULATORY_CARE_PROVIDER_SITE_OTHER): Payer: Self-pay | Admitting: Physician Assistant

## 2017-10-20 VITALS — BP 129/77 | HR 75 | Temp 98.5°F | Ht 68.0 in | Wt 257.0 lb

## 2017-10-20 DIAGNOSIS — Z9189 Other specified personal risk factors, not elsewhere classified: Secondary | ICD-10-CM

## 2017-10-20 DIAGNOSIS — E559 Vitamin D deficiency, unspecified: Secondary | ICD-10-CM | POA: Diagnosis not present

## 2017-10-20 DIAGNOSIS — Z6839 Body mass index (BMI) 39.0-39.9, adult: Secondary | ICD-10-CM

## 2017-10-20 DIAGNOSIS — I10 Essential (primary) hypertension: Secondary | ICD-10-CM

## 2017-10-20 MED ORDER — LISINOPRIL 20 MG PO TABS: ORAL_TABLET | ORAL | 0 refills | Status: DC

## 2017-10-20 NOTE — Progress Notes (Signed)
Office: [phone]  /  Fax: [phone]   HPI:   Chief Complaint: OBESITY Jonathan Hansen is here to discuss his progress with his obesity treatment plan. He is on the Category 4 plan and is following his eating plan approximately 85 % of the time. He states he is working on appliances and walked for 2 hours 1 time per week. Jonathan Hansen continues to do well with weight loss. He has more liquid calories, especially on the weekends. His weight is 257 lb (116.6 kg) today and has had a weight loss of 6 pounds over a period of 2 weeks since his last visit. He has lost 24 lbs since starting treatment with [place].  Hypertension Jonathan Hansen is a 42 y.o. male with hypertension. He is on lisinopril 40 mg. Jonathan Hansen denies chest pain or shortness of breath on exertion. He is working weight loss to help control his blood pressure with the goal of decreasing his risk of heart attack and stroke. Jonathan Hansen blood pressure is stable.  At risk for cardiovascular disease Jonathan Hansen is at a higher than average risk for cardiovascular disease due to obesity. He currently denies any chest pain.  Vitamin D deficiency Jonathan Hansen has a diagnosis of vitamin D deficiency. He is currently taking vit D and denies nausea, vomiting or muscle weakness.  ALLERGIES: Allergies  Allergen Reactions  . Ibuprofen Other (See Comments)    Bleeding  . Penicillins     MEDICATIONS: Current Outpatient Medications on File Prior to Visit  Medication Sig Dispense Refill  . HYDROcodone-acetaminophen (NORCO/VICODIN) 5-325 MG tablet Take 1 tablet by mouth every 6 (six) hours as needed for moderate pain. 45 tablet 0  . HYDROcodone-acetaminophen (NORCO/VICODIN) 5-325 MG tablet Take 1 tablet by mouth every 6 (six) hours as needed for moderate pain. 45 tablet 0  . HYDROcodone-acetaminophen (NORCO/VICODIN) 5-325 MG tablet Take 1 tablet by mouth every 6 (six) hours as needed for moderate pain. 45 tablet 0  . lisinopril (PRINIVIL,ZESTRIL) 20 MG  tablet 2 po qd 180 tablet 3  . metFORMIN (GLUCOPHAGE) 500 MG tablet TAKE 1 TABLET BY MOUTH EVERY DAY WITH BREAKFAST 90 tablet 0  . Vitamin D, Ergocalciferol, (DRISDOL) 50000 units CAPS capsule TAKE 1 CAPSULE (50,000 UNITS TOTAL) BY MOUTH EVERY 7 (SEVEN) DAYS. 4 capsule 0   No current facility-administered medications on file prior to visit.     PAST MEDICAL HISTORY: Past Medical History:  Diagnosis Date  . Arthritis   . Back pain   . Chest pain   . Dyspnea   . Elevated liver enzymes   . Fatty liver   . HLD (hyperlipidemia)   . HTN (hypertension)   . Joint pain   . Leg edema   . Osteoarthritis     PAST SURGICAL HISTORY: Past Surgical History:  Procedure Laterality Date  . KNEE ARTHROSCOPY    . VASECTOMY      SOCIAL HISTORY: Social History   Tobacco Use  . Smoking status: Current Every Day Smoker    Packs/day: 1.00    Years: 25.00    Pack years: 25.00    Types: Cigarettes  . Smokeless tobacco: Never Used  Substance Use Topics  . Alcohol use: Yes  . Drug use: Not on file    FAMILY HISTORY: Family History  Problem Relation Age of Onset  . Stroke Father   . Coronary artery disease Father        carotid stenosis    ROS: Review of Systems  Constitutional: Positive for weight loss.  Respiratory: Negative for shortness of breath (on exertion).   Cardiovascular: Negative for chest pain.  Gastrointestinal: Negative for nausea and vomiting.  Musculoskeletal:       Negative for muscle weakness    PHYSICAL EXAM: Blood pressure 129/77, pulse 75, temperature 98.5 F (36.9 C), temperature source Oral, height 5\' 8"  (1.727 m), weight 257 lb (116.6 kg), SpO2 96 %. Body mass index is 39.08 kg/m. Physical Exam  Constitutional: He is oriented to person, place, and time. He appears well-developed and well-nourished.  Cardiovascular: Normal rate.  Pulmonary/Chest: Effort normal.  Musculoskeletal: Normal range of motion.  Neurological: He is oriented to person, place,  and time.  Skin: Skin is warm and dry.  Psychiatric: He has a normal mood and affect. His behavior is normal.  Vitals reviewed.   RECENT LABS AND TESTS: BMET    Component Value Date/Time   NA 141 08/13/2017 1223   K 4.8 08/13/2017 1223   CL 101 08/13/2017 1223   CO2 25 08/13/2017 1223   GLUCOSE 94 08/13/2017 1223   GLUCOSE 133 (H) 05/19/2017 1554   BUN 10 08/13/2017 1223   CREATININE 0.84 08/13/2017 1223   CALCIUM 9.4 08/13/2017 1223   GFRNONAA 106 08/13/2017 1223   GFRAA 123 08/13/2017 1223   Lab Results  Component Value Date   HGBA1C 6.0 (H) 08/13/2017   Lab Results  Component Value Date   INSULIN 17.3 08/13/2017   CBC    Component Value Date/Time   WBC 5.3 08/13/2017 1223   WBC 6.0 11/11/2016 1757   RBC 4.66 08/13/2017 1223   RBC 4.57 11/11/2016 1757   HGB 14.7 08/13/2017 1223   HCT 43.2 08/13/2017 1223   PLT 216.0 11/11/2016 1757   MCV 93 08/13/2017 1223   MCH 31.5 08/13/2017 1223   MCHC 34.0 08/13/2017 1223   MCHC 34.6 11/11/2016 1757   RDW 14.0 08/13/2017 1223   LYMPHSABS 1.9 08/13/2017 1223   MONOABS 0.9 11/11/2016 1757   EOSABS 0.1 08/13/2017 1223   BASOSABS 0.0 08/13/2017 1223   Iron/TIBC/Ferritin/ %Sat No results found for: IRON, TIBC, FERRITIN, IRONPCTSAT Lipid Panel     Component Value Date/Time   CHOL 175 08/13/2017 1223   TRIG 296 (H) 08/13/2017 1223   HDL 39 (L) 08/13/2017 1223   CHOLHDL 5 05/19/2017 1554   VLDL 77.6 (H) 05/19/2017 1554   LDLCALC 77 08/13/2017 1223   LDLDIRECT 46.0 05/19/2017 1554   Hepatic Function Panel     Component Value Date/Time   PROT 7.2 08/13/2017 1223   ALBUMIN 4.2 08/13/2017 1223   AST 46 (H) 08/13/2017 1223   ALT 69 (H) 08/13/2017 1223   ALKPHOS 45 08/13/2017 1223   BILITOT 0.6 08/13/2017 1223      Component Value Date/Time   TSH 1.570 08/13/2017 1223   Results for Jonathan BodoBURDETTE, Jonathan Hansen (MRN [id number]) as of 10/20/2017 17:19  Ref. Range 08/13/2017 12:23  Vitamin D, 25-Hydroxy Latest Ref Range: 30.0 -  100.0 ng/mL 13.3 (L)   ASSESSMENT AND PLAN: Essential hypertension - Plan: lisinopril (PRINIVIL,ZESTRIL) 20 MG tablet  Vitamin D deficiency  At risk for heart disease  Class 2 severe obesity with serious comorbidity and body mass index (BMI) of 39.0 to 39.9 in adult, unspecified obesity type (HCC)  PLAN:  Hypertension We discussed sodium restriction, working on healthy weight loss, and a regular exercise program as the means to achieve improved blood pressure control. Jonathan Hansen agreed with this plan and agreed to follow up as directed. We will continue to monitor  his blood pressure as well as his progress with the above lifestyle modifications. He agrees to  continue lisinopril 20 mg take 2 pills daily #60 with no refills and will watch for signs of hypotension as he continues his lifestyle modifications.  Cardiovascular risk counseling Jonathan Hansen was given extended (15 minutes) coronary artery disease prevention counseling today. He is 41 y.o. male and has risk factors for heart disease including obesity and hypertension. We discussed intensive lifestyle modifications today with an emphasis on specific weight loss instructions and strategies. Pt was also informed of the importance of increasing exercise and decreasing saturated fats to help prevent heart disease.  Vitamin D Deficiency Jonathan Hansen was informed that low vitamin D levels contributes to fatigue and are associated with obesity, breast, and colon cancer. He agrees to continue to take prescription Vit D @50 ,000 IU every week and will follow up for routine testing of vitamin D, at least 2-3 times per year. He was informed of the risk of over-replacement of vitamin D and agrees to not increase his dose unless he discusses this with [place] first.  Obesity Jonathan Hansen is currently in the action stage of change. As such, his goal is to continue with weight loss efforts He has agreed to follow the Category 4 plan Jonathan Hansen has been instructed to work up  to a goal of 150 minutes of combined cardio and strengthening exercise per week for weight loss and overall health benefits. We discussed the following Behavioral Modification Strategies today: increasing lean protein intake and decrease liquid calories  Jonathan Hansen has agreed to follow up with our clinic in 3 weeks. He was informed of the importance of frequent follow up visits to maximize his success with intensive lifestyle modifications for his multiple health conditions.   OBESITY BEHAVIORAL INTERVENTION VISIT  Today's visit was # 4 out of 22.  Starting weight: 281 lbs Starting date: 08/13/17 Today's weight : 257 lbs Today's date: 10/20/2017 Total lbs lost to date: 24 (Patients must lose 7 lbs in the first 6 months to continue with counseling)   ASK: We discussed the diagnosis of obesity with Jonathan Hansen today and Jonathan Hansen agreed to give [place] permission to discuss obesity behavioral modification therapy today.  ASSESS: Niles has the diagnosis of obesity and his BMI today is 39.09 Jabaree is in the action stage of change   ADVISE: Sung was educated on the multiple health risks of obesity as well as the benefit of weight loss to improve his health. He was advised of the need for long term treatment and the importance of lifestyle modifications.  AGREE: Multiple dietary modification options and treatment options were discussed and  Haidyn agreed to the above obesity treatment plan.   Cristi Loron, am acting as transcriptionist for [employer], PA-C I, [facility], have reviewed this note and agree with its content

## 2017-10-27 ENCOUNTER — Other Ambulatory Visit (INDEPENDENT_AMBULATORY_CARE_PROVIDER_SITE_OTHER): Payer: Self-pay | Admitting: Family Medicine

## 2017-10-27 DIAGNOSIS — E559 Vitamin D deficiency, unspecified: Secondary | ICD-10-CM

## 2017-10-29 ENCOUNTER — Other Ambulatory Visit: Payer: Self-pay | Admitting: Family Medicine

## 2017-10-29 DIAGNOSIS — M25562 Pain in left knee: Principal | ICD-10-CM

## 2017-10-29 DIAGNOSIS — M25561 Pain in right knee: Principal | ICD-10-CM

## 2017-10-29 DIAGNOSIS — G8929 Other chronic pain: Secondary | ICD-10-CM

## 2017-10-29 MED ORDER — HYDROCODONE-ACETAMINOPHEN 5-325 MG PO TABS: 1.0000 | ORAL_TABLET | Freq: Four times a day (QID) | ORAL | 0 refills | Status: DC | PRN

## 2017-10-29 NOTE — Telephone Encounter (Signed)
Copied from CRM [phone]. Topic: Quick Communication - Rx Refill/Question >> Oct 29, 2017  9:38 AM Floria RavelingStovall, Shana A wrote: Medication: HYDROcodone-acetaminophen (NORCO/VICODIN) 5-325 MG tablet [[id number]][[id number]]   Has the patient contacted their pharmacy? No  (Agent: If no, request that the patient contact the pharmacy for the refill.) (Agent: If yes, when and what did the pharmacy advise?)  Preferred Pharmacy (with phone number or street name):  [id number]- [place], [place] - [address] [phone] (Phone      Agent: Please be advised that RX refills may take up to 3 business days. We ask that you follow-up with your pharmacy.

## 2017-10-29 NOTE — Telephone Encounter (Signed)
We have to see people every 3 months with opiods now

## 2017-10-29 NOTE — Telephone Encounter (Signed)
Refill of hydrocodone  LRF 10/01/17  #45  0 refills  LOV 05/19/17 Dr. Zola ButtonLowne-Chase     [id number]- [place],  - [address]    [phone]

## 2017-10-30 NOTE — Telephone Encounter (Signed)
Patient has an appointment scheduled on 11/16/17

## 2017-11-10 ENCOUNTER — Ambulatory Visit (INDEPENDENT_AMBULATORY_CARE_PROVIDER_SITE_OTHER): Payer: 59 | Admitting: Family Medicine

## 2017-11-10 ENCOUNTER — Encounter (INDEPENDENT_AMBULATORY_CARE_PROVIDER_SITE_OTHER): Payer: Self-pay | Admitting: Family Medicine

## 2017-11-10 VITALS — BP 110/74 | HR 74 | Temp 97.9°F | Ht 68.0 in | Wt 255.0 lb

## 2017-11-10 DIAGNOSIS — Z9189 Other specified personal risk factors, not elsewhere classified: Secondary | ICD-10-CM | POA: Diagnosis not present

## 2017-11-10 DIAGNOSIS — R7303 Prediabetes: Secondary | ICD-10-CM | POA: Diagnosis not present

## 2017-11-10 DIAGNOSIS — Z6838 Body mass index (BMI) 38.0-38.9, adult: Secondary | ICD-10-CM

## 2017-11-10 DIAGNOSIS — E559 Vitamin D deficiency, unspecified: Secondary | ICD-10-CM

## 2017-11-10 MED ORDER — METFORMIN HCL 500 MG PO TABS: 500.0000 mg | ORAL_TABLET | Freq: Two times a day (BID) | ORAL | 0 refills | Status: DC

## 2017-11-11 NOTE — Progress Notes (Signed)
Office: [phone]  /  Fax: [phone]   HPI:   Chief Complaint: OBESITY Jonathan Hansen is here to discuss his progress with his obesity treatment plan. He is on the Category 4 plan and is following his eating plan approximately 80 % of the time. He states he is exercising 0 minutes 0 times per week. Jonathan Hansen did well with the category 4 plan. He reports feeling hungry late at night, however hunger is controlled during the day. His weight is 255 lb (115.7 kg) today and has had a weight loss of 2 pounds over a period of 3 weeks since his last visit. He has lost 26 lbs since starting treatment with us.  Pre-Diabetes Jonathan Hansen has a diagnosis of prediabetes based on his elevated Hgb A1c and was informed this puts him at greater risk of developing diabetes. Jonathan Hansen reports some polyphagia, especially in the evening. He is taking metformin currently and continues to work on diet and exercise to decrease risk of diabetes. He denies nausea or hypoglycemia.  At risk for diabetes Jonathan Hansen is at higher than average risk for developing diabetes due to his obesity and pre-diabetes. He currently denies polyuria or polydipsia.  Vitamin D deficiency Jonathan Hansen has a diagnosis of vitamin D deficiency. He is on prescription vit D and denies nausea, vomiting or muscle weakness.  ALLERGIES: Allergies  Allergen Reactions  . Ibuprofen Other (See Comments)    Bleeding  . Penicillins     MEDICATIONS: Current Outpatient Medications on File Prior to Visit  Medication Sig Dispense Refill  . HYDROcodone-acetaminophen (NORCO/VICODIN) 5-325 MG tablet Take 1 tablet by mouth every 6 (six) hours as needed for moderate pain. 45 tablet 0  . HYDROcodone-acetaminophen (NORCO/VICODIN) 5-325 MG tablet Take 1 tablet by mouth every 6 (six) hours as needed for moderate pain. 45 tablet 0  . HYDROcodone-acetaminophen (NORCO/VICODIN) 5-325 MG tablet Take 1 tablet by mouth every 6 (six) hours as needed for moderate pain. 45 tablet 0    . lisinopril (PRINIVIL,ZESTRIL) 20 MG tablet 2 po qd 60 tablet 0  . Vitamin D, Ergocalciferol, (DRISDOL) 50000 units CAPS capsule TAKE 1 CAPSULE (50,000 UNITS TOTAL) BY MOUTH EVERY 7 (SEVEN) DAYS. 4 capsule 0   No current facility-administered medications on file prior to visit.     PAST MEDICAL HISTORY: Past Medical History:  Diagnosis Date  . Arthritis   . Back pain   . Chest pain   . Dyspnea   . Elevated liver enzymes   . Fatty liver   . HLD (hyperlipidemia)   . HTN (hypertension)   . Joint pain   . Leg edema   . Osteoarthritis     PAST SURGICAL HISTORY: Past Surgical History:  Procedure Laterality Date  . KNEE ARTHROSCOPY    . VASECTOMY      SOCIAL HISTORY: Social History   Tobacco Use  . Smoking status: Current Every Day Smoker    Packs/day: 1.00    Years: 25.00    Pack years: 25.00    Types: Cigarettes  . Smokeless tobacco: Never Used  Substance Use Topics  . Alcohol use: Yes  . Drug use: Not on file    FAMILY HISTORY: Family History  Problem Relation Age of Onset  . Stroke Father   . Coronary artery disease Father        carotid stenosis    ROS: Review of Systems  Constitutional: Positive for weight loss.  Gastrointestinal: Negative for nausea and vomiting.  Genitourinary: Negative for frequency.  Musculoskeletal:  Negative for muscle weakness  Endo/Heme/Allergies: Negative for polydipsia.       Positive for polyphagia Negative for hypoglycemia    PHYSICAL EXAM: Blood pressure 110/74, pulse 74, temperature 97.9 F (36.6 C), temperature source Oral, height 5\' 8"  (1.727 m), weight 255 lb (115.7 kg), SpO2 97 %. Body mass index is 38.77 kg/m. Physical Exam  Constitutional: He is oriented to person, place, and time. He appears well-developed and well-nourished.  Cardiovascular: Normal rate.  Pulmonary/Chest: Effort normal.  Musculoskeletal: Normal range of motion.  Neurological: He is oriented to person, place, and time.  Skin: Skin  is warm and dry.  Psychiatric: He has a normal mood and affect. His behavior is normal.  Vitals reviewed.   RECENT LABS AND TESTS: BMET    Component Value Date/Time   NA 141 08/13/2017 1223   K 4.8 08/13/2017 1223   CL 101 08/13/2017 1223   CO2 25 08/13/2017 1223   GLUCOSE 94 08/13/2017 1223   GLUCOSE 133 (H) 05/19/2017 1554   BUN 10 08/13/2017 1223   CREATININE 0.84 08/13/2017 1223   CALCIUM 9.4 08/13/2017 1223   GFRNONAA 106 08/13/2017 1223   GFRAA 123 08/13/2017 1223   Lab Results  Component Value Date   HGBA1C 6.0 (H) 08/13/2017   Lab Results  Component Value Date   INSULIN 17.3 08/13/2017   CBC    Component Value Date/Time   WBC 5.3 08/13/2017 1223   WBC 6.0 11/11/2016 1757   RBC 4.66 08/13/2017 1223   RBC 4.57 11/11/2016 1757   HGB 14.7 08/13/2017 1223   HCT 43.2 08/13/2017 1223   PLT 216.0 11/11/2016 1757   MCV 93 08/13/2017 1223   MCH 31.5 08/13/2017 1223   MCHC 34.0 08/13/2017 1223   MCHC 34.6 11/11/2016 1757   RDW 14.0 08/13/2017 1223   LYMPHSABS 1.9 08/13/2017 1223   MONOABS 0.9 11/11/2016 1757   EOSABS 0.1 08/13/2017 1223   BASOSABS 0.0 08/13/2017 1223   Iron/TIBC/Ferritin/ %Sat No results found for: IRON, TIBC, FERRITIN, IRONPCTSAT Lipid Panel     Component Value Date/Time   CHOL 175 08/13/2017 1223   TRIG 296 (H) 08/13/2017 1223   HDL 39 (L) 08/13/2017 1223   CHOLHDL 5 05/19/2017 1554   VLDL 77.6 (H) 05/19/2017 1554   LDLCALC 77 08/13/2017 1223   LDLDIRECT 46.0 05/19/2017 1554   Hepatic Function Panel     Component Value Date/Time   PROT 7.2 08/13/2017 1223   ALBUMIN 4.2 08/13/2017 1223   AST 46 (H) 08/13/2017 1223   ALT 69 (H) 08/13/2017 1223   ALKPHOS 45 08/13/2017 1223   BILITOT 0.6 08/13/2017 1223      Component Value Date/Time   TSH 1.570 08/13/2017 1223   Results for LEIGHTON, BRICKLEY (MRN [id number]) as of 11/11/2017 08:25  Ref. Range 08/13/2017 12:23  Vitamin D, 25-Hydroxy Latest Ref Range: 30.0 - 100.0 ng/mL 13.3 (L)    ASSESSMENT AND PLAN: Prediabetes - Plan: metFORMIN (GLUCOPHAGE) 500 MG tablet  Vitamin D deficiency  At risk for diabetes mellitus  Class 2 severe obesity with serious comorbidity and body mass index (BMI) of 38.0 to 38.9 in adult, unspecified obesity type [facility])  PLAN:  Pre-Diabetes Amante will continue to work on weight loss, exercise, and decreasing simple carbohydrates in his diet to help decrease the risk of diabetes. We dicussed metformin including benefits and risks. He was informed that eating too many simple carbohydrates or too many calories at one sitting increases the likelihood of GI side effects. Jonathan Noa  agrees to increase metformin to 500 mg BID #60 with no refills and follow up with [place] as directed to monitor his progress.  Diabetes risk counseling Corey was given extended (15 minutes) diabetes prevention counseling today. He is 42 y.o. male and has risk factors for diabetes including obesity and pre-diabetes. We discussed intensive lifestyle modifications today with an emphasis on weight loss as well as increasing exercise and decreasing simple carbohydrates in his diet.  Vitamin D Deficiency Nyshawn was informed that low vitamin D levels contributes to fatigue and are associated with obesity, breast, and colon cancer. He agrees to continue to take prescription Vit D @50 ,000 IU every week and will follow up for routine testing of vitamin D, at least 2-3 times per year. He was informed of the risk of over-replacement of vitamin D and agrees to not increase his dose unless he discusses this with [place] first. We will check labs next month.  Obesity Britton is currently in the action stage of change. As such, his goal is to continue with weight loss efforts He has agreed to follow the Category 4 plan Jasyah has been instructed to work up to a goal of 150 minutes of combined cardio and strengthening exercise per week for weight loss and overall health benefits. We discussed  the following Behavioral Modification Strategies today: better snacking choices and work on meal planning and easy cooking plans  Linkyn has agreed to follow up with our clinic in 3 weeks. He was informed of the importance of frequent follow up visits to maximize his success with intensive lifestyle modifications for his multiple health conditions.   OBESITY BEHAVIORAL INTERVENTION VISIT  Today's visit was # 5 out of 22.  Starting weight: 281 lbs Starting date: 08/13/17 Today's weight : 255 lbs  Today's date: 11/10/2017 Total lbs lost to date: 51    ASK: We discussed the diagnosis of obesity with Mittie Bodo today and Jonathan Noa agreed to give [place] permission to discuss obesity behavioral modification therapy today.  ASSESS: Jahan has the diagnosis of obesity and his BMI today is 38.78 Onyx is in the action stage of change   ADVISE: Autry was educated on the multiple health risks of obesity as well as the benefit of weight loss to improve his health. He was advised of the need for long term treatment and the importance of lifestyle modifications.  AGREE: Multiple dietary modification options and treatment options were discussed and  Adair agreed to the above obesity treatment plan.  I, [place] Crane, am acting as transcriptionist for Quillian Quince, MD  I have reviewed the above documentation for accuracy and completeness, and I agree with the above. -Quillian Quince, MD

## 2017-11-16 ENCOUNTER — Encounter: Payer: Self-pay | Admitting: Family Medicine

## 2017-11-16 ENCOUNTER — Ambulatory Visit: Payer: 59 | Admitting: Family Medicine

## 2017-11-16 VITALS — BP 138/82 | HR 65 | Temp 98.2°F | Ht 68.0 in | Wt 263.8 lb

## 2017-11-16 DIAGNOSIS — G8929 Other chronic pain: Secondary | ICD-10-CM

## 2017-11-16 DIAGNOSIS — Z79899 Other long term (current) drug therapy: Secondary | ICD-10-CM | POA: Diagnosis not present

## 2017-11-16 DIAGNOSIS — M25561 Pain in right knee: Secondary | ICD-10-CM

## 2017-11-16 DIAGNOSIS — M791 Myalgia, unspecified site: Secondary | ICD-10-CM | POA: Diagnosis not present

## 2017-11-16 DIAGNOSIS — M25562 Pain in left knee: Secondary | ICD-10-CM

## 2017-11-16 DIAGNOSIS — M2550 Pain in unspecified joint: Secondary | ICD-10-CM | POA: Diagnosis not present

## 2017-11-16 MED ORDER — HYDROCODONE-ACETAMINOPHEN 5-325 MG PO TABS: 1.0000 | ORAL_TABLET | Freq: Four times a day (QID) | ORAL | 0 refills | Status: DC | PRN

## 2017-11-16 MED ORDER — PREDNISONE 10 MG PO TABS: ORAL_TABLET | ORAL | 0 refills | Status: DC

## 2017-11-16 NOTE — Progress Notes (Signed)
Patient ID: Jonathan Hansen, male    DOB: 08/27/1975  Age: 42 y.o. MRN: [id number]    Subjective:  Subjective  HPI Jonathan Hansen presents for f/u pain meds.  He states he is having more pain than just his knees.  Hands, arms, knees, hips etc.  There is a fam hx rheum arthritis--- no known injuries.    Review of Systems  Constitutional: Negative for chills and fever.  HENT: Negative for congestion and hearing loss.   Eyes: Negative for discharge.  Respiratory: Negative for cough and shortness of breath.   Cardiovascular: Negative for chest pain, palpitations and leg swelling.  Gastrointestinal: Negative for abdominal pain, blood in stool, constipation, diarrhea, nausea and vomiting.  Genitourinary: Negative for dysuria, frequency, hematuria and urgency.  Musculoskeletal: Positive for arthralgias and myalgias. Negative for back pain.  Skin: Negative for rash.  Allergic/Immunologic: Negative for environmental allergies.  Neurological: Negative for dizziness, weakness and headaches.  Hematological: Does not bruise/bleed easily.  Psychiatric/Behavioral: Negative for suicidal ideas. The patient is not nervous/anxious.     History Past Medical History:  Diagnosis Date  . Arthritis   . Back pain   . Chest pain   . Dyspnea   . Elevated liver enzymes   . Fatty liver   . HLD (hyperlipidemia)   . HTN (hypertension)   . Joint pain   . Leg edema   . Osteoarthritis     He has a past surgical history that includes Vasectomy and Knee arthroscopy.   His family history includes Coronary artery disease in his father; Stroke in his father.He reports that he has been smoking cigarettes.  He has a 25.00 pack-year smoking history. He has never used smokeless tobacco. He reports that he drinks alcohol. His drug history is not on file.  Current Outpatient Medications on File Prior to Visit  Medication Sig Dispense Refill  . lisinopril (PRINIVIL,ZESTRIL) 20 MG tablet 2 po qd 60 tablet 0  .  metFORMIN (GLUCOPHAGE) 500 MG tablet Take 1 tablet (500 mg total) by mouth 2 (two) times daily with a meal. 60 tablet 0  . Vitamin D, Ergocalciferol, (DRISDOL) 50000 units CAPS capsule TAKE 1 CAPSULE (50,000 UNITS TOTAL) BY MOUTH EVERY 7 (SEVEN) DAYS. 4 capsule 0   No current facility-administered medications on file prior to visit.      Objective:  Objective  Physical Exam  Constitutional: He is oriented to person, place, and time. Vital signs are normal. He appears well-developed and well-nourished. He is sleeping.  HENT:  Head: Normocephalic and atraumatic.  Mouth/Throat: Oropharynx is clear and moist.  Eyes: Pupils are equal, round, and reactive to light. EOM are normal.  Neck: Normal range of motion. Neck supple. No thyromegaly present.  Cardiovascular: Normal rate and regular rhythm.  No murmur heard. Pulmonary/Chest: Effort normal and breath sounds normal. No respiratory distress. He has no wheezes. He has no rales. He exhibits no tenderness.  Musculoskeletal: He exhibits no edema or tenderness.  Neurological: He is alert and oriented to person, place, and time.  Skin: Skin is warm and dry.  Psychiatric: He has a normal mood and affect. His behavior is normal. Judgment and thought content normal.  Nursing note and vitals reviewed.  BP 138/82 (BP Location: Left Arm, Patient Position: Sitting, Cuff Size: Large)   Pulse 65   Temp 98.2 F (36.8 C) (Oral)   Ht 5\' 8"  (1.727 m)   Wt 263 lb 12.8 oz (119.7 kg)   SpO2 100%   BMI 40.11 kg/m  Wt Readings from Last 3 Encounters:  11/16/17 263 lb 12.8 oz (119.7 kg)  11/10/17 255 lb (115.7 kg)  10/20/17 257 lb (116.6 kg)     Lab Results  Component Value Date   WBC 5.3 08/13/2017   HGB 14.7 08/13/2017   HCT 43.2 08/13/2017   PLT 216.0 11/11/2016   GLUCOSE 94 08/13/2017   CHOL 175 08/13/2017   TRIG 296 (H) 08/13/2017   HDL 39 (L) 08/13/2017   LDLDIRECT 46.0 05/19/2017   LDLCALC 77 08/13/2017   ALT 69 (H) 08/13/2017   AST 46  (H) 08/13/2017   NA 141 08/13/2017   K 4.8 08/13/2017   CL 101 08/13/2017   CREATININE 0.84 08/13/2017   BUN 10 08/13/2017   CO2 25 08/13/2017   TSH 1.570 08/13/2017   HGBA1C 6.0 (H) 08/13/2017    No results found.   Assessment & Plan:  Plan  I am having Jonathan Hansen Start start on predniSONE. I am also having him maintain his lisinopril, Vitamin D (Ergocalciferol), metFORMIN, HYDROcodone-acetaminophen, HYDROcodone-acetaminophen, and HYDROcodone-acetaminophen.  Meds ordered this encounter  Medications  . HYDROcodone-acetaminophen (NORCO/VICODIN) 5-325 MG tablet    Sig: Take 1 tablet by mouth every 6 (six) hours as needed for moderate pain.    Dispense:  45 tablet    Refill:  0    Do not fill until Jan 29, 2018  . HYDROcodone-acetaminophen (NORCO/VICODIN) 5-325 MG tablet    Sig: Take 1 tablet by mouth every 6 (six) hours as needed for moderate pain.    Dispense:  45 tablet    Refill:  0    Do not fill until Sept 2019  . HYDROcodone-acetaminophen (NORCO/VICODIN) 5-325 MG tablet    Sig: Take 1 tablet by mouth every 6 (six) hours as needed for moderate pain.    Dispense:  45 tablet    Refill:  0    Do not fill until  Nov 29, 2017  . predniSONE (DELTASONE) 10 MG tablet    Sig: TAKE 3 TABLETS PO QD FOR 3 DAYS THEN TAKE 2 TABLETS PO QD FOR 3 DAYS THEN TAKE 1 TABLET PO QD FOR 3 DAYS THEN TAKE 1/2 TAB PO QD FOR 3 DAYS    Dispense:  20 tablet    Refill:  0    Problem List Items Addressed This Visit      Unprioritized   Chronic pain of both knees   Relevant Medications   HYDROcodone-acetaminophen (NORCO/VICODIN) 5-325 MG tablet   HYDROcodone-acetaminophen (NORCO/VICODIN) 5-325 MG tablet   HYDROcodone-acetaminophen (NORCO/VICODIN) 5-325 MG tablet   predniSONE (DELTASONE) 10 MG tablet    Other Visit Diagnoses    Myalgia    -  Primary   Relevant Medications   predniSONE (DELTASONE) 10 MG tablet   Other Relevant Orders   Rheumatoid Factor   Antinuclear Antib (ANA)    Sedimentation rate   Ambulatory referral to Rheumatology   Multiple joint pain       Relevant Medications   predniSONE (DELTASONE) 10 MG tablet   Other Relevant Orders   Rheumatoid Factor   Antinuclear Antib (ANA)   Sedimentation rate   Ambulatory referral to Rheumatology   High risk medication use       Relevant Orders   Pain Mgmt, Profile 8 w/Conf, U      Follow-up: Return in about 3 months (around 02/16/2018).  Donato SchultzYvonne R Lowne Chase, DO

## 2017-11-16 NOTE — Patient Instructions (Signed)
Joint Pain Joint pain, which is also called arthralgia, can be caused by many things. Joint pain often goes away when you follow your health care provider's instructions for relieving pain at home. However, joint pain can also be caused by conditions that require further treatment. Common causes of joint pain include:  Bruising in the area of the joint.  Overuse of the joint.  Wear and tear on the joints that occur with aging (osteoarthritis).  Various other forms of arthritis.  A buildup of a crystal form of uric acid in the joint (gout).  Infections of the joint (septic arthritis) or of the bone (osteomyelitis).  Your health care provider may recommend medicine to help with the pain. If your joint pain continues, additional tests may be needed to diagnose your condition. Follow these instructions at home: Watch your condition for any changes. Follow these instructions as directed to lessen the pain that you are feeling.  Take medicines only as directed by your health care provider.  Rest the affected area for as long as your health care provider says that you should. If directed to do so, raise the painful joint above the level of your heart while you are sitting or lying down.  Do not do things that cause or worsen pain.  If directed, apply ice to the painful area: ? Put ice in a plastic bag. ? Place a towel between your skin and the bag. ? Leave the ice on for 20 minutes, 2-3 times per day.  Wear an elastic bandage, splint, or sling as directed by your health care provider. Loosen the elastic bandage or splint if your fingers or toes become numb and tingle, or if they turn cold and blue.  Begin exercising or stretching the affected area as directed by your health care provider. Ask your health care provider what types of exercise are safe for you.  Keep all follow-up visits as directed by your health care provider. This is important.  Contact a health care provider if:  Your  pain increases, and medicine does not help.  Your joint pain does not improve within 3 days.  You have increased bruising or swelling.  You have a fever.  You lose 10 lb (4.5 kg) or more without trying. Get help right away if:  You are not able to move the joint.  Your fingers or toes become numb or they turn cold and blue. This information is not intended to replace advice given to you by your health care provider. Make sure you discuss any questions you have with your health care provider. Document Released: 04/07/2005 Document Revised: 09/07/2015 Document Reviewed: 01/17/2014 Elsevier Interactive Patient Education  2018 [employer]  

## 2017-11-17 LAB — SEDIMENTATION RATE: Sed Rate: 8 mm/hr (ref 0–15)

## 2017-11-18 LAB — PAIN MGMT, PROFILE 8 W/CONF, U
6 Acetylmorphine: NEGATIVE ng/mL (ref <10)
AMPHETAMINES: NEGATIVE ng/mL (ref <500)
Alcohol Metabolites: NEGATIVE ng/mL (ref <500)
BENZODIAZEPINES: NEGATIVE ng/mL (ref <100)
BUPRENORPHINE, URINE: NEGATIVE ng/mL (ref <5)
Cocaine Metabolite: NEGATIVE ng/mL (ref <150)
Codeine: NEGATIVE ng/mL (ref <50)
Creatinine: 44.1 mg/dL
HYDROCODONE: 53 ng/mL — AB (ref <50)
HYDROMORPHONE: 84 ng/mL — AB (ref <50)
MDMA: NEGATIVE ng/mL (ref <500)
Marijuana Metabolite: NEGATIVE ng/mL (ref <20)
Morphine: NEGATIVE ng/mL (ref <50)
NORHYDROCODONE: 58 ng/mL — AB (ref <50)
OXYCODONE: NEGATIVE ng/mL (ref <100)
Opiates: POSITIVE ng/mL — AB (ref <100)
Oxidant: NEGATIVE ug/mL (ref <200)
pH: 6.58 (ref 4.5–9.0)

## 2017-11-18 LAB — ANA: Anti Nuclear Antibody(ANA): NEGATIVE

## 2017-11-18 LAB — RHEUMATOID FACTOR: Rhuematoid fact SerPl-aCnc: <14 IU/mL (ref <14)

## 2017-11-24 ENCOUNTER — Other Ambulatory Visit (INDEPENDENT_AMBULATORY_CARE_PROVIDER_SITE_OTHER): Payer: Self-pay | Admitting: Family Medicine

## 2017-11-24 DIAGNOSIS — E559 Vitamin D deficiency, unspecified: Secondary | ICD-10-CM

## 2017-12-01 ENCOUNTER — Ambulatory Visit (INDEPENDENT_AMBULATORY_CARE_PROVIDER_SITE_OTHER): Payer: Self-pay | Admitting: Physician Assistant

## 2017-12-01 ENCOUNTER — Other Ambulatory Visit: Payer: Self-pay | Admitting: Family Medicine

## 2017-12-01 ENCOUNTER — Encounter (INDEPENDENT_AMBULATORY_CARE_PROVIDER_SITE_OTHER): Payer: Self-pay

## 2017-12-01 ENCOUNTER — Encounter: Payer: Self-pay | Admitting: Family Medicine

## 2017-12-01 DIAGNOSIS — G8929 Other chronic pain: Secondary | ICD-10-CM

## 2017-12-01 DIAGNOSIS — M25569 Pain in unspecified knee: Principal | ICD-10-CM

## 2017-12-01 NOTE — Telephone Encounter (Signed)
I put referral in but did not find a Dr Liliane ShiWinter---  It may be he saw him outside [place]? He had other joint pains which is why rheum referral was put in ----- even though labs were neg there sometimes can be something rheum--- but if he wants to put it off -- I'm ok with that

## 2017-12-02 NOTE — Addendum Note (Signed)
Addended by: Thelma BargeICHARDSON, Elan Brainerd D on: 12/02/2017 08:37 AM   Modules accepted: Orders

## 2017-12-03 NOTE — Progress Notes (Deleted)
Office Visit Note  Patient: Jonathan Hansen             Date of Birth: 23-May-1975           MRN: [id number]             PCP: Donato SchultzLowne Chase, Yvonne R, DO Referring: Donato SchultzLowne Chase, Yvonne R, * Visit Date: 12/15/2017 Occupation: @GUAROCC @  Subjective:  No chief complaint on file.   History of Present Illness: Jonathan Hansen is a 42 y.o. male ***   Activities of Daily Living:  Patient reports morning stiffness for *** {minute/hour:19697}.   Patient {ACTIONS;DENIES/REPORTS:21021675::"Denies"} nocturnal pain.  Difficulty dressing/grooming: {ACTIONS;DENIES/REPORTS:21021675::"Denies"} Difficulty climbing stairs: {ACTIONS;DENIES/REPORTS:21021675::"Denies"} Difficulty getting out of chair: {ACTIONS;DENIES/REPORTS:21021675::"Denies"} Difficulty using hands for taps, buttons, cutlery, and/or writing: {ACTIONS;DENIES/REPORTS:21021675::"Denies"}  No Rheumatology ROS completed.   PMFS History:  Patient Active Problem List   Diagnosis Date Noted  . Morbid obesity (HCC) 05/19/2017  . Essential hypertension 02/17/2016  . Chronic pain of both knees 02/17/2016  . Chest pain 02/17/2016    Past Medical History:  Diagnosis Date  . Arthritis   . Back pain   . Chest pain   . Dyspnea   . Elevated liver enzymes   . Fatty liver   . HLD (hyperlipidemia)   . HTN (hypertension)   . Joint pain   . Leg edema   . Osteoarthritis     Family History  Problem Relation Age of Onset  . Stroke Father   . Coronary artery disease Father        carotid stenosis   Past Surgical History:  Procedure Laterality Date  . KNEE ARTHROSCOPY    . VASECTOMY     Social History   Social History Narrative  . Not on file    Objective: Vital Signs: There were no vitals taken for this visit.   Physical Exam   Musculoskeletal Exam: ***  CDAI Exam: CDAI Score: Not documented Patient Global Assessment: Not documented; Provider Global Assessment: Not documented Swollen: Not documented; Tender: Not  documented Joint Exam   Not documented   There is currently no information documented on the homunculus. Go to the Rheumatology activity and complete the homunculus joint exam.  Investigation: Findings:  08/13/17: Vitamin D 131.3, Vitamin B12 525, Folate >20, TSH 1.570, T4 1.10, T3 105 10/2917: RF <14, ANA negative, Sed rate 8  Component     Latest Ref Rng & Units 08/13/2017  Vitamin D, 25-Hydroxy     30.0 - 100.0 ng/mL 13.3 (L)  Vitamin B12     232 - 1,245 pg/mL 525  Folate     >3.0 ng/mL >20.0  TSH     0.450 - 4.500 uIU/mL 1.570  T4,Free(Direct)     0.82 - 1.77 ng/dL 7.821.10  Triiodothyronine (T3)     71 - 180 ng/dL [id number]   Component     Latest Ref Rng & Units 11/16/2017  RA Latex Turbid.     <14 IU/mL <14  Anti Nuclear Antibody(ANA)     NEGATIVE NEGATIVE  Sed Rate     0 - 15 mm/hr 8   Imaging: No results found.  Recent Labs: Lab Results  Component Value Date   WBC 5.3 08/13/2017   HGB 14.7 08/13/2017   PLT 216.0 11/11/2016   NA 141 08/13/2017   K 4.8 08/13/2017   CL 101 08/13/2017   CO2 25 08/13/2017   GLUCOSE 94 08/13/2017   BUN 10 08/13/2017   CREATININE 0.84 08/13/2017   BILITOT 0.6 08/13/2017  ALKPHOS 45 08/13/2017   AST 46 (H) 08/13/2017   ALT 69 (H) 08/13/2017   PROT 7.2 08/13/2017   ALBUMIN 4.2 08/13/2017   CALCIUM 9.4 08/13/2017   GFRAA 123 08/13/2017    Speciality Comments: No specialty comments available.  Procedures:  No procedures performed Allergies: Ibuprofen and Penicillins   Assessment / Plan:     Visit Diagnoses: No diagnosis found.   Orders: No orders of the defined types were placed in this encounter.  No orders of the defined types were placed in this encounter.   Face-to-face time spent with patient was *** minutes. Greater than 50% of time was spent in counseling and coordination of care.  Follow-Up Instructions: No follow-ups on file.   Gearldine Bienenstockaylor M Veeda Virgo, PA-C  Note - This record has been created using Dragon software.    Chart creation errors have been sought, but may not always  have been located. Such creation errors do not reflect on  the standard of medical care.

## 2017-12-07 ENCOUNTER — Ambulatory Visit (INDEPENDENT_AMBULATORY_CARE_PROVIDER_SITE_OTHER): Payer: Self-pay | Admitting: Family Medicine

## 2017-12-15 ENCOUNTER — Ambulatory Visit: Payer: 59 | Admitting: Rheumatology

## 2017-12-15 ENCOUNTER — Encounter (INDEPENDENT_AMBULATORY_CARE_PROVIDER_SITE_OTHER): Payer: Self-pay | Admitting: Physician Assistant

## 2017-12-15 ENCOUNTER — Ambulatory Visit (INDEPENDENT_AMBULATORY_CARE_PROVIDER_SITE_OTHER): Payer: 59 | Admitting: Physician Assistant

## 2017-12-15 VITALS — BP 129/82 | HR 68 | Temp 97.7°F | Ht 68.0 in | Wt 248.0 lb

## 2017-12-15 DIAGNOSIS — E559 Vitamin D deficiency, unspecified: Secondary | ICD-10-CM

## 2017-12-15 DIAGNOSIS — Z6837 Body mass index (BMI) 37.0-37.9, adult: Secondary | ICD-10-CM | POA: Diagnosis not present

## 2017-12-15 DIAGNOSIS — Z9189 Other specified personal risk factors, not elsewhere classified: Secondary | ICD-10-CM | POA: Diagnosis not present

## 2017-12-15 DIAGNOSIS — R7303 Prediabetes: Secondary | ICD-10-CM

## 2017-12-15 MED ORDER — VITAMIN D (ERGOCALCIFEROL) 1.25 MG (50000 UNIT) PO CAPS: 50000.0000 [IU] | ORAL_CAPSULE | ORAL | 0 refills | Status: DC

## 2017-12-15 MED ORDER — METFORMIN HCL 500 MG PO TABS: 500.0000 mg | ORAL_TABLET | Freq: Two times a day (BID) | ORAL | 0 refills | Status: DC

## 2017-12-16 NOTE — Progress Notes (Signed)
Office: [phone]  /  Fax: [phone]   HPI:   Chief Complaint: OBESITY Jonathan Hansen is here to discuss his progress with his obesity treatment plan. He is on the Category 4 plan and is following hiseating plan approximately 90 % of the time. He states he is exercising 0 minutes 0 times per week. Jonathan Hansen did well with weight loss. He reports that he followed the plan closely, however he indulged on a few occasions. His weight is 248 lb (112.5 kg) today and has had a weight loss of 7 pounds over a period of 5 weeks since his last visit. He has lost 33 lbs since starting treatment with [place].  Pre-Diabetes Jonathan Hansen has a diagnosis of prediabetes based on his elevated Hgb A1c and was informed this puts him at greater risk of developing diabetes. He is taking metformin currently and continues to work on diet and exercise to decrease risk of diabetes. He denies nausea, vomiting, or diarrhea. He denies episodes of hypoglycemia.  Vitamin D deficiency Jonathan Hansen has a diagnosis of vitamin D deficiency. He stopped taking 2 weeks ago without running out. He denies nausea, vomiting or muscle weakness.  At risk for osteopenia and osteoporosis Jonathan Hansen is at higher risk of osteopenia and osteoporosis due to vitamin D deficiency.   ALLERGIES: Allergies  Allergen Reactions  . Ibuprofen Other (See Comments)    Bleeding  . Penicillins     MEDICATIONS: Current Outpatient Medications on File Prior to Visit  Medication Sig Dispense Refill  . HYDROcodone-acetaminophen (NORCO/VICODIN) 5-325 MG tablet Take 1 tablet by mouth every 6 (six) hours as needed for moderate pain. 45 tablet 0  . HYDROcodone-acetaminophen (NORCO/VICODIN) 5-325 MG tablet Take 1 tablet by mouth every 6 (six) hours as needed for moderate pain. 45 tablet 0  . HYDROcodone-acetaminophen (NORCO/VICODIN) 5-325 MG tablet Take 1 tablet by mouth every 6 (six) hours as needed for moderate pain. 45 tablet 0  . lisinopril (PRINIVIL,ZESTRIL) 20 MG  tablet 2 po qd 60 tablet 0  . predniSONE (DELTASONE) 10 MG tablet TAKE 3 TABLETS PO QD FOR 3 DAYS THEN TAKE 2 TABLETS PO QD FOR 3 DAYS THEN TAKE 1 TABLET PO QD FOR 3 DAYS THEN TAKE 1/2 TAB PO QD FOR 3 DAYS 20 tablet 0   No current facility-administered medications on file prior to visit.     PAST MEDICAL HISTORY: Past Medical History:  Diagnosis Date  . Arthritis   . Back pain   . Chest pain   . Dyspnea   . Elevated liver enzymes   . Fatty liver   . HLD (hyperlipidemia)   . HTN (hypertension)   . Joint pain   . Leg edema   . Osteoarthritis     PAST SURGICAL HISTORY: Past Surgical History:  Procedure Laterality Date  . KNEE ARTHROSCOPY    . VASECTOMY      SOCIAL HISTORY: Social History   Tobacco Use  . Smoking status: Current Every Day Smoker    Packs/day: 1.00    Years: 25.00    Pack years: 25.00    Types: Cigarettes  . Smokeless tobacco: Never Used  Substance Use Topics  . Alcohol use: Yes  . Drug use: Not on file    FAMILY HISTORY: Family History  Problem Relation Age of Onset  . Stroke Father   . Coronary artery disease Father        carotid stenosis    ROS: Review of Systems  Gastrointestinal: Negative for diarrhea, nausea and vomiting.  Musculoskeletal:       Negative for muscle weakness.  Endo/Heme/Allergies:       Negative for hypoglycemia.    PHYSICAL EXAM: Blood pressure 129/82, pulse 68, temperature 97.7 F (36.5 C), temperature source Oral, height 5\' 8"  (1.727 m), weight 248 lb (112.5 kg), SpO2 97 %. Body mass index is 37.71 kg/m. Physical Exam  RECENT LABS AND TESTS: BMET    Component Value Date/Time   NA 141 08/13/2017 1223   K 4.8 08/13/2017 1223   CL 101 08/13/2017 1223   CO2 25 08/13/2017 1223   GLUCOSE 94 08/13/2017 1223   GLUCOSE 133 (H) 05/19/2017 1554   BUN 10 08/13/2017 1223   CREATININE 0.84 08/13/2017 1223   CALCIUM 9.4 08/13/2017 1223   GFRNONAA 106 08/13/2017 1223   GFRAA 123 08/13/2017 1223   Lab Results    Component Value Date   HGBA1C 6.0 (H) 08/13/2017   Lab Results  Component Value Date   INSULIN 17.3 08/13/2017   CBC    Component Value Date/Time   WBC 5.3 08/13/2017 1223   WBC 6.0 11/11/2016 1757   RBC 4.66 08/13/2017 1223   RBC 4.57 11/11/2016 1757   HGB 14.7 08/13/2017 1223   HCT 43.2 08/13/2017 1223   PLT 216.0 11/11/2016 1757   MCV 93 08/13/2017 1223   MCH 31.5 08/13/2017 1223   MCHC 34.0 08/13/2017 1223   MCHC 34.6 11/11/2016 1757   RDW 14.0 08/13/2017 1223   LYMPHSABS 1.9 08/13/2017 1223   MONOABS 0.9 11/11/2016 1757   EOSABS 0.1 08/13/2017 1223   BASOSABS 0.0 08/13/2017 1223   Iron/TIBC/Ferritin/ %Sat No results found for: IRON, TIBC, FERRITIN, IRONPCTSAT Lipid Panel     Component Value Date/Time   CHOL 175 08/13/2017 1223   TRIG 296 (H) 08/13/2017 1223   HDL 39 (L) 08/13/2017 1223   CHOLHDL 5 05/19/2017 1554   VLDL 77.6 (H) 05/19/2017 1554   LDLCALC 77 08/13/2017 1223   LDLDIRECT 46.0 05/19/2017 1554   Hepatic Function Panel     Component Value Date/Time   PROT 7.2 08/13/2017 1223   ALBUMIN 4.2 08/13/2017 1223   AST 46 (H) 08/13/2017 1223   ALT 69 (H) 08/13/2017 1223   ALKPHOS 45 08/13/2017 1223   BILITOT 0.6 08/13/2017 1223      Component Value Date/Time   TSH 1.570 08/13/2017 1223   Results for BRITTAN, MAPEL (MRN [id number]) as of 12/16/2017 09:26  Ref. Range 08/13/2017 12:23  Vitamin D, 25-Hydroxy Latest Ref Range: 30.0 - 100.0 ng/mL 13.3 (L)    ASSESSMENT AND PLAN: Prediabetes - Plan: metFORMIN (GLUCOPHAGE) 500 MG tablet  Vitamin D deficiency - Plan: Vitamin D, Ergocalciferol, (DRISDOL) 50000 units CAPS capsule  At risk for osteoporosis  Class 2 severe obesity with serious comorbidity and body mass index (BMI) of 37.0 to 37.9 in adult, unspecified obesity type [facility])  PLAN:  Pre-Diabetes Thedore will continue to work on weight loss, exercise, and decreasing simple carbohydrates in his diet to help decrease the risk of diabetes. We  dicussed metformin including benefits and risks. He was informed that eating too many simple carbohydrates or too many calories at one sitting increases the likelihood of GI side effects. Jonathan Hansen is on metformin 500mg  #60 with no refills and a prescription was written today. Jonathan Hansen agreed to follow up with [place] in 2 to 3 weeks and we will check labs at his next visit.  Vitamin D Deficiency Jonathan Hansen was informed that low vitamin D levels contributes to fatigue and  are associated with obesity, breast, and colon cancer. He agrees to continue to take prescription Vit D @50 ,000 IU every week #4 with no refills and will follow up for routine testing of vitamin D, at least 2-3 times per year. He was informed of the risk of over-replacement of vitamin D and agrees to not increase his dose unless he discusses this with us first. He agrees to follow up in 2 to 3 weeks.  At risk for osteopenia and osteoporosis Jonathan Hansen is at risk for osteopenia and osteoporosis due to his vitamin D deficiency. He was encouraged to take his vitamin D and follow his higher calcium diet and increase strengthening exercise to help strengthen his bones and decrease his risk of osteopenia and osteoporosis.  Obesity Jonathan Hansen is currently in the action stage of change. As such, his goal is to continue with weight loss efforts.  He has agreed to follow the Category 4 plan. Jonathan Hansen has been instructed to work up to a goal of 150 minutes of combined cardio and strengthening exercise per week for weight loss and overall health benefits. We discussed the following Behavioral Modification Strategies today: work on meal planning and easy cooking plans and planning for success.  Jonathan Hansen has agreed to follow up with our clinic in 2 to 3 weeks. He was informed of the importance of frequent follow up visits to maximize his success with intensive lifestyle modifications for his multiple health conditions.   OBESITY BEHAVIORAL INTERVENTION  VISIT  Today's visit was # 6  Starting weight: 281 lbs Starting date: 08/13/17 Today's weight :248 lb (112.5 kg)  Today's date: 12/15/2017 Total lbs lost to date: 33 At least 15 minutes were spent on discussing the following behavioral intervention visit.   ASK: We discussed the diagnosis of obesity with Jonathan Hansen today and Jonathan Hansen agreed to give us permission to discuss obesity behavioral modification therapy today.  ASSESS: Jonathan Hansen has the diagnosis of obesity and his BMI today is 37.72. Jonathan Hansen is in the action stage of change.   ADVISE: Jonathan Hansen was educated on the multiple health risks of obesity as well as the benefit of weight loss to improve his health. He was advised of the need for long term treatment and the importance of lifestyle modifications to improve his current health and to decrease his risk of future health problems.  AGREE: Multiple dietary modification options and treatment options were discussed and Jonathan Hansen agreed to follow the recommendations documented in the above note.  ARRANGE: Jonathan Hansen was educated on the importance of frequent visits to treat obesity as outlined per CMS and USPSTF guidelines and agreed to schedule his next follow up appointment today.  Launa FlightI, Cara Soares, am acting as transcriptionist for Alois Clicheracey Aarion Kittrell, PA-C I, Alois Clicheracey Iyauna Sing, PA-C have reviewed above note and agree with its content

## 2018-01-06 ENCOUNTER — Ambulatory Visit (INDEPENDENT_AMBULATORY_CARE_PROVIDER_SITE_OTHER): Payer: Self-pay | Admitting: Physician Assistant

## 2018-01-12 ENCOUNTER — Ambulatory Visit (INDEPENDENT_AMBULATORY_CARE_PROVIDER_SITE_OTHER): Payer: 59 | Admitting: Bariatrics

## 2018-01-12 VITALS — BP 129/81 | HR 73 | Temp 97.6°F | Ht 68.0 in | Wt 249.0 lb

## 2018-01-12 DIAGNOSIS — E559 Vitamin D deficiency, unspecified: Secondary | ICD-10-CM | POA: Diagnosis not present

## 2018-01-12 DIAGNOSIS — Z9189 Other specified personal risk factors, not elsewhere classified: Secondary | ICD-10-CM | POA: Diagnosis not present

## 2018-01-12 DIAGNOSIS — F3289 Other specified depressive episodes: Secondary | ICD-10-CM

## 2018-01-12 DIAGNOSIS — R7303 Prediabetes: Secondary | ICD-10-CM

## 2018-01-12 DIAGNOSIS — Z6838 Body mass index (BMI) 38.0-38.9, adult: Secondary | ICD-10-CM | POA: Diagnosis not present

## 2018-01-12 MED ORDER — BUPROPION HCL ER (SR) 150 MG PO TB12: 150.0000 mg | ORAL_TABLET | Freq: Every day | ORAL | 0 refills | Status: DC

## 2018-01-12 NOTE — Progress Notes (Signed)
Office: [phone]  /  Fax: [phone]   HPI:   Chief Complaint: OBESITY Jonathan Hansen is here to discuss his progress with his obesity treatment plan. He is on the  follow the Category 4 plan and is following his eating plan approximately 70 % of the time. He states he is exercising 0 minutes 0 times per week. Jonathan Hansen has an increase in knee pain as well as an increase in stress and depression. His increase in stress is contributing to his eating at night. His ETOH has increased as well as his carbohydrates.  His weight is 249 lb (112.9 kg) today and has gained 1 lb since his last visit. He has lost 33 lbs since starting treatment with us.  Pre-Diabetes Jonathan Hansen has a diagnosis of prediabetes based on his elevated HgA1c and was informed this puts him at greater risk of developing diabetes. He is not taking metformin currently and continues to work on diet and exercise to decrease risk of diabetes. He denies nausea or hypoglycemia. He stopped the metformin secondary to GI upset (diarrhea). Denies polyphagia and hyperglycemia.   Vitamin D deficiency Jonathan Hansen has a diagnosis of vitamin D deficiency. He is currently taking vit D and denies nausea, vomiting or muscle weakness. Patient has been on Vitamin D but stopped. Last Vit D level 13.3.   At risk for diabetes Jonathan Hansen is at higher than averagerisk for developing diabetes due to his obesity. He currently denies polyuria or polydipsia.  Depression with emotional eating behaviors Jonathan Hansen is struggling with emotional eating and using food for comfort to the extent that it is negatively impacting his health. He often snacks when he is not hungry. Jonathan Hansen sometimes feels he is out of control and then feels guilty that he made poor food choices. He has been working on behavior modification techniques to help reduce his emotional eating and has been somewhat successful. He shows no sign of suicidal or homicidal ideations.  [facility]  08/13/2017 03/04/2016  Decreased Interest 3 0  Down, Depressed, Hopeless 1 0  PHQ - 2 Score 4 0  Altered sleeping 3 -  Tired, decreased energy 3 -  Change in appetite 1 -  Feeling bad or failure about yourself  1 -  Trouble concentrating 1 -  Moving slowly or fidgety/restless 3 -  Suicidal thoughts 0 -  PHQ-9 Score 16 -  Difficult doing work/chores Somewhat difficult -      ALLERGIES: Allergies  Allergen Reactions  . Ibuprofen Other (See Comments)    Bleeding  . Penicillins     MEDICATIONS: Current Outpatient Medications on File Prior to Visit  Medication Sig Dispense Refill  . HYDROcodone-acetaminophen (NORCO/VICODIN) 5-325 MG tablet Take 1 tablet by mouth every 6 (six) hours as needed for moderate pain. 45 tablet 0  . HYDROcodone-acetaminophen (NORCO/VICODIN) 5-325 MG tablet Take 1 tablet by mouth every 6 (six) hours as needed for moderate pain. 45 tablet 0  . HYDROcodone-acetaminophen (NORCO/VICODIN) 5-325 MG tablet Take 1 tablet by mouth every 6 (six) hours as needed for moderate pain. 45 tablet 0  . lisinopril (PRINIVIL,ZESTRIL) 20 MG tablet 2 po qd 60 tablet 0  . predniSONE (DELTASONE) 10 MG tablet TAKE 3 TABLETS PO QD FOR 3 DAYS THEN TAKE 2 TABLETS PO QD FOR 3 DAYS THEN TAKE 1 TABLET PO QD FOR 3 DAYS THEN TAKE 1/2 TAB PO QD FOR 3 DAYS 20 tablet 0  . Vitamin D, Ergocalciferol, (DRISDOL) 50000 units CAPS capsule Take 1 capsule (50,000 Units  total) by mouth every 7 (seven) days. 4 capsule 0   No current facility-administered medications on file prior to visit.     PAST MEDICAL HISTORY: Past Medical History:  Diagnosis Date  . Arthritis   . Back pain   . Chest pain   . Dyspnea   . Elevated liver enzymes   . Fatty liver   . HLD (hyperlipidemia)   . HTN (hypertension)   . Joint pain   . Leg edema   . Osteoarthritis     PAST SURGICAL HISTORY: Past Surgical History:  Procedure Laterality Date  . KNEE ARTHROSCOPY    . VASECTOMY      SOCIAL HISTORY: Social  History   Tobacco Use  . Smoking status: Current Every Day Smoker    Packs/day: 1.00    Years: 25.00    Pack years: 25.00    Types: Cigarettes  . Smokeless tobacco: Never Used  Substance Use Topics  . Alcohol use: Yes  . Drug use: Not on file    FAMILY HISTORY: Family History  Problem Relation Age of Onset  . Stroke Father   . Coronary artery disease Father        carotid stenosis    ROS: Review of Systems  All other systems reviewed and are negative.   PHYSICAL EXAM: Blood pressure 129/81, pulse 73, temperature 97.6 F (36.4 C), temperature source Oral, height 5\' 8"  (1.727 m), weight 249 lb (112.9 kg), SpO2 99 %. Body mass index is 37.86 kg/m. Physical Exam  Constitutional: He is oriented to person, place, and time. He appears well-developed and well-nourished.  HENT:  Head: Normocephalic.  Eyes: EOM are normal.  Neck: Normal range of motion.  Pulmonary/Chest: Effort normal.  Musculoskeletal: Normal range of motion.  Neurological: He is alert and oriented to person, place, and time.  Skin: Skin is warm and dry.  Psychiatric: He has a normal mood and affect. His behavior is normal.  Vitals reviewed.   RECENT LABS AND TESTS: BMET    Component Value Date/Time   NA 141 08/13/2017 1223   K 4.8 08/13/2017 1223   CL 101 08/13/2017 1223   CO2 25 08/13/2017 1223   GLUCOSE 94 08/13/2017 1223   GLUCOSE 133 (H) 05/19/2017 1554   BUN 10 08/13/2017 1223   CREATININE 0.84 08/13/2017 1223   CALCIUM 9.4 08/13/2017 1223   GFRNONAA 106 08/13/2017 1223   GFRAA 123 08/13/2017 1223   Lab Results  Component Value Date   HGBA1C 6.0 (H) 08/13/2017   Lab Results  Component Value Date   INSULIN 17.3 08/13/2017   CBC    Component Value Date/Time   WBC 5.3 08/13/2017 1223   WBC 6.0 11/11/2016 1757   RBC 4.66 08/13/2017 1223   RBC 4.57 11/11/2016 1757   HGB 14.7 08/13/2017 1223   HCT 43.2 08/13/2017 1223   PLT 216.0 11/11/2016 1757   MCV 93 08/13/2017 1223   MCH  31.5 08/13/2017 1223   MCHC 34.0 08/13/2017 1223   MCHC 34.6 11/11/2016 1757   RDW 14.0 08/13/2017 1223   LYMPHSABS 1.9 08/13/2017 1223   MONOABS 0.9 11/11/2016 1757   EOSABS 0.1 08/13/2017 1223   BASOSABS 0.0 08/13/2017 1223   Iron/TIBC/Ferritin/ %Sat No results found for: IRON, TIBC, FERRITIN, IRONPCTSAT Lipid Panel     Component Value Date/Time   CHOL 175 08/13/2017 1223   TRIG 296 (H) 08/13/2017 1223   HDL 39 (L) 08/13/2017 1223   CHOLHDL 5 05/19/2017 1554   VLDL 77.6 (  H) 05/19/2017 1554   LDLCALC 77 08/13/2017 1223   LDLDIRECT 46.0 05/19/2017 1554   Hepatic Function Panel     Component Value Date/Time   PROT 7.2 08/13/2017 1223   ALBUMIN 4.2 08/13/2017 1223   AST 46 (H) 08/13/2017 1223   ALT 69 (H) 08/13/2017 1223   ALKPHOS 45 08/13/2017 1223   BILITOT 0.6 08/13/2017 1223      Component Value Date/Time   TSH 1.570 08/13/2017 1223    ASSESSMENT AND PLAN: Prediabetes - Plan: Comprehensive metabolic panel, Hemoglobin A1c, Insulin, random  Vitamin D deficiency - Plan: VITAMIN D 25 Hydroxy (Vit-D Deficiency, Fractures)  Other depression - with emotional eating - Plan: buPROPion (WELLBUTRIN SR) 150 MG 12 hr tablet  At risk for diabetes mellitus  Class 2 severe obesity with serious comorbidity and body mass index (BMI) of 38.0 to 38.9 in adult, unspecified obesity type (HCC)  PLAN: Pre-Diabetes Keean will continue to work on weight loss, exercise, and decreasing simple carbohydrates in his diet to help decrease the risk of diabetes. We dicussed metformin including benefits and risks. He was informed that eating too many simple carbohydrates or too many calories at one sitting increases the likelihood of GI side effects. Hence declined metformin for now and a prescription was not written today. Jamond agreed to follow up with [place] as directed to monitor his progress. Will not resume Metformin at this time. Will recheck Hgba1C and insulin in the near future.    Vitamin D Deficiency Geronimo was informed that low vitamin D levels contributes to fatigue and are associated with obesity, breast, and colon cancer. He agrees to continue to take prescription Vit D @50 ,000 IU every week and will follow up for routine testing of vitamin D, at least 2-3 times per year. He was informed of the risk of over-replacement of vitamin D and agrees to not increase his dose unless he discusses this with [place] first. Will recheck Vit D level today and plan to increase food high in Vit D.   Depression with Emotional Eating Behaviors We discussed behavior modification techniques today to help Neiman deal with his emotional eating and depression. He has agreed to take Wellbutrin SR 150 mg qd in which was written today for a 30 day supply with 0 refills and agreed to follow up as directed. Discussed CBT strategies for stress and emotional eating.   Diabetes risk counselling Wenceslaus was given extended (15 minutes) diabetes prevention counseling today. He is 42 y.o. male and has risk factors for diabetes including obesity. We discussed intensive lifestyle modifications today with an emphasis on weight loss as well as increasing exercise and decreasing simple carbohydrates in his diet.  Obesity Ade is currently in the action stage of change. As such, his goal is to continue with weight loss efforts He has agreed to follow the Category 3 plan Rosco has been instructed to work up to a goal of 150 minutes of combined cardio and strengthening exercise per week for weight loss and overall health benefits. We discussed the following Behavioral Modification Stratagies today: increasing lean protein intake, decreasing simple carbohydrates , increasing vegetables and decrease eating out no skipping meals and better snacking choices. Plan to decrease carbohydrates, no or minimal ETOH and decrease stress eating.   Elihu has agreed to follow up with our clinic in 2 weeks. He was  informed of the importance of frequent follow up visits to maximize his success with intensive lifestyle modifications for his multiple health conditions.  OBESITY BEHAVIORAL INTERVENTION VISIT  Today's visit was # 7   Starting weight: 281 lbs Starting date: 08/13/17 Today's weight : Weight: 249 lb (112.9 kg)  Today's date: 01/12/2018 Total lbs lost to date: 26   ASK: We discussed the diagnosis of obesity with Mittie Bodo today and Jonathan Noa agreed to give [place] permission to discuss obesity behavioral modification therapy today.  ASSESS: Jaimes has the diagnosis of obesity and his BMI today is @TBMI @ Joron is in the action stage of change   ADVISE: Harper was educated on the multiple health risks of obesity as well as the benefit of weight loss to improve his health. He was advised of the need for long term treatment and the importance of lifestyle modifications to improve his current health and to decrease his risk of future health problems.  AGREE: Multiple dietary modification options and treatment options were discussed and  Stuart agreed to follow the recommendations documented in the above note.  ARRANGE: Taavi was educated on the importance of frequent visits to treat obesity as outlined per CMS and USPSTF guidelines and agreed to schedule his next follow up appointment today.  I, April Moore, am acting as transcriptionist for Dr Corinna Capra  I have reviewed the above documentation for accuracy and completeness, and I agree with the above. -Corinna Capra, DO

## 2018-01-13 LAB — COMPREHENSIVE METABOLIC PANEL
ALBUMIN: 4.4 g/dL (ref 3.5–5.5)
ALK PHOS: 49 IU/L (ref 39–117)
ALT: 26 IU/L (ref 0–44)
AST: 20 IU/L (ref 0–40)
Albumin/Globulin Ratio: 1.7 (ref 1.2–2.2)
BUN / CREAT RATIO: 16 (ref 9–20)
BUN: 14 mg/dL (ref 6–24)
Bilirubin Total: 0.7 mg/dL (ref 0.0–1.2)
CO2: 24 mmol/L (ref 20–29)
CREATININE: 0.85 mg/dL (ref 0.76–1.27)
Calcium: 9.1 mg/dL (ref 8.7–10.2)
Chloride: 101 mmol/L (ref 96–106)
GFR calc Af Amer: 123 mL/min/{1.73_m2} (ref >59)
GFR calc non Af Amer: 106 mL/min/{1.73_m2} (ref >59)
GLUCOSE: 97 mg/dL (ref 65–99)
Globulin, Total: 2.6 g/dL (ref 1.5–4.5)
Potassium: 4.2 mmol/L (ref 3.5–5.2)
Sodium: 142 mmol/L (ref 134–144)
TOTAL PROTEIN: 7 g/dL (ref 6.0–8.5)

## 2018-01-13 LAB — HEMOGLOBIN A1C
Est. average glucose Bld gHb Est-mCnc: 105 mg/dL
HEMOGLOBIN A1C: 5.3 % (ref 4.8–5.6)

## 2018-01-13 LAB — VITAMIN D 25 HYDROXY (VIT D DEFICIENCY, FRACTURES): Vit D, 25-Hydroxy: 16.6 ng/mL — ABNORMAL LOW (ref 30.0–100.0)

## 2018-01-13 LAB — INSULIN, RANDOM: INSULIN: 11 u[IU]/mL (ref 2.6–24.9)

## 2018-01-18 ENCOUNTER — Encounter (INDEPENDENT_AMBULATORY_CARE_PROVIDER_SITE_OTHER): Payer: Self-pay | Admitting: Bariatrics

## 2018-01-19 ENCOUNTER — Encounter (INDEPENDENT_AMBULATORY_CARE_PROVIDER_SITE_OTHER): Payer: Self-pay | Admitting: Bariatrics

## 2018-01-21 ENCOUNTER — Ambulatory Visit: Payer: 59 | Admitting: Rheumatology

## 2018-01-26 ENCOUNTER — Ambulatory Visit (INDEPENDENT_AMBULATORY_CARE_PROVIDER_SITE_OTHER): Payer: 59 | Admitting: Bariatrics

## 2018-02-09 ENCOUNTER — Ambulatory Visit (INDEPENDENT_AMBULATORY_CARE_PROVIDER_SITE_OTHER): Payer: 59 | Admitting: Bariatrics

## 2018-02-09 VITALS — BP 133/85 | HR 71 | Temp 98.3°F | Ht 68.0 in | Wt 250.0 lb

## 2018-02-09 DIAGNOSIS — F3289 Other specified depressive episodes: Secondary | ICD-10-CM

## 2018-02-09 DIAGNOSIS — R7303 Prediabetes: Secondary | ICD-10-CM | POA: Diagnosis not present

## 2018-02-09 DIAGNOSIS — Z9189 Other specified personal risk factors, not elsewhere classified: Secondary | ICD-10-CM

## 2018-02-09 DIAGNOSIS — E559 Vitamin D deficiency, unspecified: Secondary | ICD-10-CM

## 2018-02-09 DIAGNOSIS — Z6838 Body mass index (BMI) 38.0-38.9, adult: Secondary | ICD-10-CM

## 2018-02-09 MED ORDER — DULOXETINE HCL 30 MG PO CPEP: ORAL_CAPSULE | ORAL | 0 refills | Status: DC

## 2018-02-15 NOTE — Progress Notes (Signed)
Office: [phone]  /  Fax: [phone]   HPI:   Chief Complaint: OBESITY Jonathan Hansen is here to discuss his progress with his obesity treatment plan. He is on the Category 3 plan and is following his eating plan approximately 30 % of the time. He states he is exercising 0 minutes 0 times per week. Jonathan Hansen reports he is "eating too much". His hunger is better controlled. His weight is 250 lb (113.4 kg) today and has had a weight gain of 1 pound over a period of 4 weeks since his last visit. He has lost 31 lbs since starting treatment with [place].  Pre-Diabetes Jonathan Hansen has a diagnosis of prediabetes based on his elevated Hgb A1c of 6.0 and fasting insulin level of 17.3 and was informed this puts him at greater risk of developing diabetes. He is not taking metformin currently and continues to work on diet and exercise to decrease risk of diabetes. He denies nausea or hypoglycemia.  Vitamin D deficiency Jonathan Hansen has a diagnosis of vitamin D deficiency. He is currently taking high dose vit D and denies nausea, vomiting or muscle weakness.  At risk for osteopenia and osteoporosis Jonathan Hansen is at higher risk of osteopenia and osteoporosis due to vitamin D deficiency.   Depression with emotional eating behaviors Jonathan Hansen started Bupropion and he had side effects and stopped the medicine (increased anxiety). Jonathan Hansen struggles with emotional eating and using food for comfort to the extent that it is negatively impacting his health. He often snacks when he is not hungry. Jonathan Hansen sometimes feels he is out of control and then feels guilty that he made poor food choices. He has been working on behavior modification techniques to help reduce his emotional eating and has been somewhat successful. Jonathan Hansen has increased knee pain and is seeing his PCP. He shows no sign of suicidal or homicidal ideations.  Depression screen Jonathan Hansen 2/9 08/13/2017 03/04/2016  Decreased Interest 3 0  Down, Depressed, Hopeless 1 0  PHQ -  2 Score 4 0  Altered sleeping 3 -  Tired, decreased energy 3 -  Change in appetite 1 -  Feeling bad or failure about yourself  1 -  Trouble concentrating 1 -  Moving slowly or fidgety/restless 3 -  Suicidal thoughts 0 -  PHQ-9 Score 16 -  Difficult doing work/chores Somewhat difficult -     ALLERGIES: Allergies  Allergen Reactions  . Ibuprofen Other (See Comments)    Bleeding  . Penicillins     MEDICATIONS: Current Outpatient Medications on File Prior to Visit  Medication Sig Dispense Refill  . buPROPion (WELLBUTRIN SR) 150 MG 12 hr tablet Take 1 tablet (150 mg total) by mouth daily. 30 tablet 0  . HYDROcodone-acetaminophen (NORCO/VICODIN) 5-325 MG tablet Take 1 tablet by mouth every 6 (six) hours as needed for moderate pain. 45 tablet 0  . HYDROcodone-acetaminophen (NORCO/VICODIN) 5-325 MG tablet Take 1 tablet by mouth every 6 (six) hours as needed for moderate pain. 45 tablet 0  . HYDROcodone-acetaminophen (NORCO/VICODIN) 5-325 MG tablet Take 1 tablet by mouth every 6 (six) hours as needed for moderate pain. 45 tablet 0  . lisinopril (PRINIVIL,ZESTRIL) 20 MG tablet 2 po qd 60 tablet 0  . predniSONE (DELTASONE) 10 MG tablet TAKE 3 TABLETS PO QD FOR 3 DAYS THEN TAKE 2 TABLETS PO QD FOR 3 DAYS THEN TAKE 1 TABLET PO QD FOR 3 DAYS THEN TAKE 1/2 TAB PO QD FOR 3 DAYS 20 tablet 0  . Vitamin D, Ergocalciferol, (DRISDOL) 50000 units  CAPS capsule Take 1 capsule (50,000 Units total) by mouth every 7 (seven) days. 4 capsule 0   No current facility-administered medications on file prior to visit.     PAST MEDICAL HISTORY: Past Medical History:  Diagnosis Date  . Arthritis   . Back pain   . Chest pain   . Dyspnea   . Elevated liver enzymes   . Fatty liver   . HLD (hyperlipidemia)   . HTN (hypertension)   . Joint pain   . Leg edema   . Osteoarthritis     PAST SURGICAL HISTORY: Past Surgical History:  Procedure Laterality Date  . KNEE ARTHROSCOPY    . VASECTOMY      SOCIAL  HISTORY: Social History   Tobacco Use  . Smoking status: Current Every Day Smoker    Packs/day: 1.00    Years: 25.00    Pack years: 25.00    Types: Cigarettes  . Smokeless tobacco: Never Used  Substance Use Topics  . Alcohol use: Yes  . Drug use: Not on file    FAMILY HISTORY: Family History  Problem Relation Age of Onset  . Stroke Father   . Coronary artery disease Father        carotid stenosis    ROS: Review of Systems  Constitutional: Negative for weight loss.  Gastrointestinal: Negative for nausea and vomiting.  Musculoskeletal:       Negative for muscle weakness + knee pain  Endo/Heme/Allergies:       Negative for polyphagia Negative for hypoglycemia  Psychiatric/Behavioral: Positive for depression. Negative for suicidal ideas. The patient is nervous/anxious (anxiety).     PHYSICAL EXAM: Blood pressure 133/85, pulse 71, temperature 98.3 F (36.8 C), temperature source Oral, height 5\' 8"  (1.727 m), weight 250 lb (113.4 kg), SpO2 98 %. Body mass index is 38.01 kg/m. Physical Exam  Constitutional: He is oriented to person, place, and time. He appears well-developed and well-nourished.  Cardiovascular: Normal rate.  Pulmonary/Chest: Effort normal.  Musculoskeletal: Normal range of motion.  Neurological: He is oriented to person, place, and time.  Skin: Skin is warm and dry.  Psychiatric: He has a normal mood and affect. His behavior is normal.  Vitals reviewed.   RECENT LABS AND TESTS: BMET    Component Value Date/Time   NA 142 01/12/2018 0751   K 4.2 01/12/2018 0751   CL 101 01/12/2018 0751   CO2 24 01/12/2018 0751   GLUCOSE 97 01/12/2018 0751   GLUCOSE 133 (H) 05/19/2017 1554   BUN 14 01/12/2018 0751   CREATININE 0.85 01/12/2018 0751   CALCIUM 9.1 01/12/2018 0751   GFRNONAA 106 01/12/2018 0751   GFRAA 123 01/12/2018 0751   Lab Results  Component Value Date   HGBA1C 5.3 01/12/2018   HGBA1C 6.0 (H) 08/13/2017   Lab Results  Component Value  Date   INSULIN 11.0 01/12/2018   INSULIN 17.3 08/13/2017   CBC    Component Value Date/Time   WBC 5.3 08/13/2017 1223   WBC 6.0 11/11/2016 1757   RBC 4.66 08/13/2017 1223   RBC 4.57 11/11/2016 1757   HGB 14.7 08/13/2017 1223   HCT 43.2 08/13/2017 1223   PLT 216.0 11/11/2016 1757   MCV 93 08/13/2017 1223   MCH 31.5 08/13/2017 1223   MCHC 34.0 08/13/2017 1223   MCHC 34.6 11/11/2016 1757   RDW 14.0 08/13/2017 1223   LYMPHSABS 1.9 08/13/2017 1223   MONOABS 0.9 11/11/2016 1757   EOSABS 0.1 08/13/2017 1223   BASOSABS 0.0  08/13/2017 1223   Iron/TIBC/Ferritin/ %Sat No results found for: IRON, TIBC, FERRITIN, IRONPCTSAT Lipid Panel     Component Value Date/Time   CHOL 175 08/13/2017 1223   TRIG 296 (H) 08/13/2017 1223   HDL 39 (L) 08/13/2017 1223   CHOLHDL 5 05/19/2017 1554   VLDL 77.6 (H) 05/19/2017 1554   LDLCALC 77 08/13/2017 1223   LDLDIRECT 46.0 05/19/2017 1554   Hepatic Function Panel     Component Value Date/Time   PROT 7.0 01/12/2018 0751   ALBUMIN 4.4 01/12/2018 0751   AST 20 01/12/2018 0751   ALT 26 01/12/2018 0751   ALKPHOS 49 01/12/2018 0751   BILITOT 0.7 01/12/2018 0751      Component Value Date/Time   TSH 1.570 08/13/2017 1223   Results for ADLAI, SINNING (MRN [id number]) as of 02/15/2018 12:02  Ref. Range 01/12/2018 07:51  Vitamin D, 25-Hydroxy Latest Ref Range: 30.0 - 100.0 ng/mL 16.6 (L)   ASSESSMENT AND PLAN: Prediabetes  Vitamin D deficiency  Other depression - with emotional eating  - Plan: DULoxetine (CYMBALTA) 30 MG capsule  At risk for osteoporosis  Class 2 severe obesity with serious comorbidity and body mass index (BMI) of 38.0 to 38.9 in adult, unspecified obesity type Jonathan Loris)  PLAN:  Pre-Diabetes Levester will continue to work on weight loss and exercise. He will work on increasing lean protein and decreasing simple carbohydrates in his diet to help decrease the risk of diabetes. We dicussed metformin including benefits and risks.  He was informed that eating too many simple carbohydrates or too many calories at Jonathan Hansen sitting increases the likelihood of GI side effects. Sione declined metformin for now and a prescription was not written today. Jonathan Hansen agreed to follow up with [place] as directed to monitor his progress.  Vitamin D Deficiency Jonathan Hansen was informed that low vitamin D levels contributes to fatigue and are associated with obesity, breast, and colon cancer. He agrees to continue to take high dose prescription Vit D @50 ,000 IU every week and will follow up for routine testing of vitamin D, at least 2-3 times per year. He was informed of the risk of over-replacement of vitamin D and agrees to not increase his dose unless he discusses this with [place] first.  At risk for osteopenia and osteoporosis Jonathan Hansen was given extended  (15 minutes) osteoporosis prevention counseling today. Jonathan Hansen is at risk for osteopenia and osteoporosis due to his vitamin D deficiency. He was encouraged to take his vitamin D and follow his higher calcium diet and increase strengthening exercise to help strengthen his bones and decrease his risk of osteopenia and osteoporosis.  Depression with Emotional Eating Behaviors We discussed behavior modification techniques today to help Jonathan Hansen deal with his emotional eating and depression. He has agreed to take Cymbalta 30 mg 1 capsule PO daily for 1 week and then increase to 30 mg BID #60 with no refills. We will refer to Jonathan Hansen our bariatric psychologist and Jonathan Hansen agreed to follow up as directed.  Obesity Oneil is currently in the action stage of change. As such, his goal is to continue with weight loss efforts He has agreed to follow the Category 4 plan Seanmichael has been instructed to work up to a goal of 150 minutes of combined cardio and strengthening exercise per week for weight loss and overall health benefits. We discussed the following Behavioral Modification Strategies today: increase H2O  intake, no skipping meals, better snacking choices, increasing lean protein intake, decreasing simple carbohydrates , increasing vegetables, decrease  eating out, work on meal planning and easy cooking plans and ways to avoid night time snacking  Arthur has agreed to follow up with our clinic in 2 weeks. He was informed of the importance of frequent follow up visits to maximize his success with intensive lifestyle modifications for his multiple health conditions.   OBESITY BEHAVIORAL INTERVENTION VISIT  Today's visit was # 8   Starting weight: 281 lbs Starting date: 08/13/17 Today's weight : 250 lbs Today's date: 02/09/2018 Total lbs lost to date: 31 At least 15 minutes were spent on discussing the following behavioral intervention visit.   ASK: We discussed the diagnosis of obesity with Mittie Bodo today and Karion agreed to give [place] permission to discuss obesity behavioral modification therapy today.  ASSESS: Nora has the diagnosis of obesity and his BMI today is 38.02 Dajaun is in the action stage of change   ADVISE: Rodolph was educated on the multiple health risks of obesity as well as the benefit of weight loss to improve his health. He was advised of the need for long term treatment and the importance of lifestyle modifications to improve his current health and to decrease his risk of future health problems.  AGREE: Multiple dietary modification options and treatment options were discussed and  Macen agreed to follow the recommendations documented in the above note.  ARRANGE: Juluis was educated on the importance of frequent visits to treat obesity as outlined per CMS and USPSTF guidelines and agreed to schedule his next follow up appointment today.  Cristi Loron, am acting as Energy manager for [employer]. Manson Passey, DO  I have reviewed the above documentation for accuracy and completeness, and I agree with the above. -Corinna Capra, DO

## 2018-02-16 ENCOUNTER — Ambulatory Visit: Payer: 59 | Admitting: Family Medicine

## 2018-02-16 ENCOUNTER — Encounter: Payer: Self-pay | Admitting: Family Medicine

## 2018-02-16 VITALS — BP 134/78 | HR 69 | Temp 98.4°F | Resp 16 | Ht 68.0 in | Wt 256.4 lb

## 2018-02-16 DIAGNOSIS — M25561 Pain in right knee: Secondary | ICD-10-CM

## 2018-02-16 DIAGNOSIS — M25562 Pain in left knee: Secondary | ICD-10-CM | POA: Diagnosis not present

## 2018-02-16 DIAGNOSIS — G8929 Other chronic pain: Secondary | ICD-10-CM | POA: Diagnosis not present

## 2018-02-16 MED ORDER — HYDROCODONE-ACETAMINOPHEN 5-325 MG PO TABS: 1.0000 | ORAL_TABLET | Freq: Four times a day (QID) | ORAL | 0 refills | Status: DC | PRN

## 2018-02-16 NOTE — Progress Notes (Unsigned)
Office: [phone]  /  Fax: [phone] Date: February 23, 2018 Time Seen: *** Duration: *** Provider: Lawerance Cruel, PsyD Type of Session: Intake for Individual Therapy   Informed Consent:The provider's role was explained to [facility]. The provider reviewed and discussed issues of confidentiality, privacy, and limits therein. In addition to verbal informed consent, written informed consent for psychological services was obtained from [place] prior to the initial intake interview. Written consent included information concerning the practice, financial arrangements, and confidentiality and patients' rights. Since the clinic is not a 24/7 crisis center, mental health emergency resources were shared and a handout was provided. The provider further explained the utilization of MyChart, e-mail, voicemail, and/or other messaging systems can be utilized for non-emergency reasons. Haadi verbally acknowledged understanding of the aforementioned, and agreed to use mental health emergency resources discussed if needed. Moreover, Tonie agreed information may be shared with other [facility] providers as needed for coordination of care, and written consent was obtained.   Chief Complaint: Eloy was referred by Dr. Corinna Capra due to depression with emotional eating behaviors. Per the note for the visit with Dr. Corinna Capra on February 09, 2018, "Maxamillion started Bupropion and he had side effects and stopped the medicine (increased anxiety). Flint struggles with emotional eating and using food for comfort to the extent that it is negatively impacting his health. He often snacks when he is not hungry. Ordean sometimes feels he is out of control and then feels guilty that he made poor food choices. He has been working on behavior modification techniques to help reduce his emotional eating and has been somewhat successful. Maika has increased knee pain and is seeing his PCP. He  shows no sign of suicidal or homicidal ideations."  Camilo was asked to complete a questionnaire assessing various behaviors related to emotional eating. Siddiq endorsed the following: {gbmoodandfood:21755}.  HPI: Carroll was seen for an initial appointment with this clinic by Dr. Debbra Riding on August 13, 2017. During the initial visit, Nitish shared he started gaining weight after the [employer] at 42 years old. His heaviest weight ever was 280 pounds. During the initial appointment with Dr. Rinaldo Ratel, Chrissie Noa reported experiencing the following: significant food cravings issues; snacking frequently in the evenings; skipping meals frequently; frequently drinking liquids with calories; frequently making poor food choices; frequently eating larger portions than normal; binge eating behaviors; and struggling with emotional eating.  Mental Status Examination: Haik arrived on time for the appointment. He presented as appropriately dressed and groomed. Kamilo appeared his stated age and demonstrated adequate orientation to time, place, person, and purpose of the appointment. He also demonstrated appropriate eye contact. No psychomotor abnormalities or behavioral peculiarities noted. His mood was {gbmood:21757} with congruent affect. His thought processes were logical, linear, and goal-directed. No hallucinations, delusions, bizarre thinking or behavior reported or observed. Judgment, insight, and impulse control appeared to be grossly intact. There was no evidence of paraphasias (i.e., errors in speech, gross mispronunciations, and word substitutions), repetition deficits, or disturbances in volume or prosody (i.e., rhythm and intonation). There was no evidence of attention or memory impairments. Atley denied current suicidal and homicidal ideation, plan, and intent.   The Mini-Mental State Examination, Second Edition (MMSE-2) was administered. The MMSE-2 briefly screens for cognitive dysfunction and  overall mental status and assesses different cognitive domains: orientation, registration, attention and calculation, recall, and language and praxis. Tyke received *** out of 30 points possible on the MMSE-2, which is noted in the *** range. The  following points were lost: ***  Family & Psychosocial History: ***  Medical History: ***  Mental Health History: ***  Allenmichael reported experiencing the following: {gbintakesxs:21966}  Keanen denied experiencing the following: {gbsxs:21965}  Structured Assessment Results: The Patient Health Questionnaire-9 (PHQ-9) is a self-report measure that assesses symptoms and severity of depression over the course of the last two weeks. Cartrell obtained a score of *** suggesting {GBPHQ9SEVERITY:21752}. Verdell finds the endorsed symptoms to be {gbphq9difficulty:21754}.    The Generalized Anxiety Disorder-7 (GAD-7) is a brief self-report measure that assesses symptoms of anxiety over the course of the last two weeks. Wrigley obtained a score of *** suggesting {gbgad7severity:21753}.  Interventions: A chart review was conducted prior to the clinical intake interview. The MMSE-2, PHQ-9, and GAD-7 were administered and a clinical intake interview was completed. In addition, Lindel was asked to complete a Mood and Food questionnaire to assess various behaviors related to emotional eating. Throughout session, empathic reflections and validation was provided. Continuing treatment with this provider was discussed and a treatment goal was established. Psychoeducation regarding emotional versus physical hunger was provided. Jimmylee was given a handout to utilize between now and the next appointment to increase awareness of hunger patterns and subsequent eating. ***  Provisional DSM-5 Diagnosis: ***  Plan: Traquan expressed understanding and agreement with the initial treatment plan of care. He appears able and willing to participate as evidenced by collaboration on a  treatment goal, engagement in reciprocal conversation, and asking questions as needed for clarification. The next appointment will be scheduled in {gbweeks:21758}. The following treatment goal was established: {gbtxgoals:21759}. For the aforementioned goal, Ravis can benefit from biweekly sessions that are brief in duration for approximately four to six sessions.

## 2018-02-16 NOTE — Progress Notes (Signed)
Patient ID: Jonathan Hansen, male    DOB: 04-04-1976  Age: 42 y.o. MRN: [id number]    Subjective:  Subjective  HPI Jonathan Hansen presents for bl knee pain that is worsening.  He had been to [facility] in [place]--- his ins is not covering them --- he needs a new ortho  Review of Systems  Constitutional: Negative for appetite change, diaphoresis, fatigue and unexpected weight change.  Eyes: Negative for pain, redness and visual disturbance.  Respiratory: Negative for cough, chest tightness, shortness of breath and wheezing.   Cardiovascular: Negative for chest pain, palpitations and leg swelling.  Endocrine: Negative for cold intolerance, heat intolerance, polydipsia, polyphagia and polyuria.  Genitourinary: Negative for difficulty urinating, dysuria and frequency.  Musculoskeletal: Positive for arthralgias and gait problem.  Neurological: Negative for dizziness, light-headedness, numbness and headaches.    History Past Medical History:  Diagnosis Date  . Arthritis   . Back pain   . Chest pain   . Dyspnea   . Elevated liver enzymes   . Fatty liver   . HLD (hyperlipidemia)   . HTN (hypertension)   . Joint pain   . Leg edema   . Osteoarthritis     He has a past surgical history that includes Vasectomy and Knee arthroscopy.   His family history includes Coronary artery disease in his father; Stroke in his father.He reports that he has been smoking cigarettes. He has a 25.00 pack-year smoking history. He has never used smokeless tobacco. He reports that he drinks alcohol. His drug history is not on file.  Current Outpatient Medications on File Prior to Visit  Medication Sig Dispense Refill  . DULoxetine (CYMBALTA) 30 MG capsule Take 1 cap by mouth daily for 1 week and then begin 30 mg BID 60 capsule 0  . Vitamin D, Ergocalciferol, (DRISDOL) 50000 units CAPS capsule Take 1 capsule (50,000 Units total) by mouth every 7 (seven) days. 4 capsule 0   No current  facility-administered medications on file prior to visit.      Objective:  Objective  Physical Exam  Constitutional: He is oriented to person, place, and time. Vital signs are normal. He appears well-developed and well-nourished. He is sleeping.  HENT:  Head: Normocephalic and atraumatic.  Mouth/Throat: Oropharynx is clear and moist.  Eyes: Pupils are equal, round, and reactive to light. EOM are normal.  Neck: Normal range of motion. Neck supple. No thyromegaly present.  Cardiovascular: Normal rate and regular rhythm.  No murmur heard. Pulmonary/Chest: Effort normal and breath sounds normal. No respiratory distress. He has no wheezes. He has no rales. He exhibits no tenderness.  Musculoskeletal: He exhibits edema and tenderness.  Neurological: He is alert and oriented to person, place, and time.  Skin: Skin is warm and dry.  Psychiatric: He has a normal mood and affect. His behavior is normal. Judgment and thought content normal.   BP 134/78   Pulse 69   Temp 98.4 F (36.9 C) (Oral)   Resp 16   Ht 5\' 8"  (1.727 m)   Wt 256 lb 6.4 oz (116.3 kg)   SpO2 99%   BMI 38.99 kg/m  Wt Readings from Last 3 Encounters:  02/16/18 256 lb 6.4 oz (116.3 kg)  02/09/18 250 lb (113.4 kg)  01/12/18 249 lb (112.9 kg)     Lab Results  Component Value Date   WBC 5.3 08/13/2017   HGB 14.7 08/13/2017   HCT 43.2 08/13/2017   PLT 216.0 11/11/2016   GLUCOSE 97 01/12/2018  CHOL 175 08/13/2017   TRIG 296 (H) 08/13/2017   HDL 39 (L) 08/13/2017   LDLDIRECT 46.0 05/19/2017   LDLCALC 77 08/13/2017   ALT 26 01/12/2018   AST 20 01/12/2018   NA 142 01/12/2018   K 4.2 01/12/2018   CL 101 01/12/2018   CREATININE 0.85 01/12/2018   BUN 14 01/12/2018   CO2 24 01/12/2018   TSH 1.570 08/13/2017   HGBA1C 5.3 01/12/2018    No results found.   Assessment & Plan:  Plan  I have discontinued Jonathan Hansen's lisinopril, predniSONE, and buPROPion. I am also having him maintain his Vitamin D  (Ergocalciferol), DULoxetine, HYDROcodone-acetaminophen, HYDROcodone-acetaminophen, and HYDROcodone-acetaminophen.  Meds ordered this encounter  Medications  . HYDROcodone-acetaminophen (NORCO/VICODIN) 5-325 MG tablet    Sig: Take 1 tablet by mouth every 6 (six) hours as needed for moderate pain.    Dispense:  60 tablet    Refill:  0    Do not fill until May 01, 2017  . HYDROcodone-acetaminophen (NORCO/VICODIN) 5-325 MG tablet    Sig: Take 1 tablet by mouth every 6 (six) hours as needed for moderate pain.    Dispense:  60 tablet    Refill:  0    Do not fill until dec 2019  . HYDROcodone-acetaminophen (NORCO/VICODIN) 5-325 MG tablet    Sig: Take 1 tablet by mouth every 6 (six) hours as needed for moderate pain.    Dispense:  60 tablet    Refill:  0    Do not fill until  Mar 01, 2018    Problem List Items Addressed This Visit      Unprioritized   Chronic pain of both knees - Primary   Relevant Medications   HYDROcodone-acetaminophen (NORCO/VICODIN) 5-325 MG tablet   HYDROcodone-acetaminophen (NORCO/VICODIN) 5-325 MG tablet   HYDROcodone-acetaminophen (NORCO/VICODIN) 5-325 MG tablet   Other Relevant Orders   Ambulatory referral to Orthopedic Surgery    will get med records from previous ortho   Follow-up: Return in about 3 months (around 05/19/2018), or if symptoms worsen or fail to improve.  Donato Schultz, DO

## 2018-02-16 NOTE — Patient Instructions (Signed)

## 2018-02-23 ENCOUNTER — Ambulatory Visit (INDEPENDENT_AMBULATORY_CARE_PROVIDER_SITE_OTHER): Payer: 59 | Admitting: Bariatrics

## 2018-02-23 ENCOUNTER — Ambulatory Visit (INDEPENDENT_AMBULATORY_CARE_PROVIDER_SITE_OTHER): Payer: 59 | Admitting: Psychology

## 2018-02-23 ENCOUNTER — Encounter (INDEPENDENT_AMBULATORY_CARE_PROVIDER_SITE_OTHER): Payer: Self-pay

## 2018-02-25 ENCOUNTER — Telehealth: Payer: Self-pay | Admitting: *Deleted

## 2018-02-25 DIAGNOSIS — F3289 Other specified depressive episodes: Secondary | ICD-10-CM

## 2018-02-25 NOTE — Telephone Encounter (Signed)
Copied from CRM [id number]. Topic: General - Other >> Feb 23, 2018 11:35 AM Debroah Loop wrote: Reason for CRM: Patient wants to now if [facility] faxed his records yet and whether they've been sent to [facility] yet? Doctor at [facility] prescribed cymbalta, but patient won't be able to continue to go there due to work related concerns. Wants to know if Dr. Laury Axon can pick up the cymbalta script for him has about a week and a half left.

## 2018-02-25 NOTE — Telephone Encounter (Signed)
Received Medical records from [facility]; forwarded to provider/SLS 11/07

## 2018-02-26 NOTE — Telephone Encounter (Signed)
I will check for records, but are you ok with refilling his Cymbalta?

## 2018-02-26 NOTE — Telephone Encounter (Signed)
Yes ok to refill 

## 2018-03-01 MED ORDER — DULOXETINE HCL 30 MG PO CPEP: 30.0000 mg | ORAL_CAPSULE | Freq: Two times a day (BID) | ORAL | 1 refills | Status: DC

## 2018-03-01 NOTE — Telephone Encounter (Signed)
Med sent in and records was received and faxed to [facility]

## 2018-04-28 ENCOUNTER — Telehealth: Payer: Self-pay | Admitting: Family Medicine

## 2018-04-28 NOTE — Telephone Encounter (Signed)
Tried initiating PA via Covermymeds; KEY: [id number]. Informed medication is already a covered benefit. See below.   This medication or product is on your plan's list of covered drugs. Prior authorization is not required at this time. If your pharmacy has questions regarding the processing of your prescription, please have them call the [facility] pharmacy help desk at [phone]. **Please note: Formulary lowering, tiering exception, cost reduction and/or pre-benefit determination review (including prospective Medicare hospice reviews) requests cannot be requested using this method of submission. Please contact us at [phone] instead.

## 2018-04-28 NOTE — Telephone Encounter (Signed)
Copied from CRM #[id number]. >> Apr 28, 2018 10:13 AM Elliot Gault wrote:  Jethro Bolus name: Denny Peon  Relation to pt: [facility]  Call back number:  [phone]  Pharmacy:  [id number], [place] - [address] [phone] (Phone) [phone] (Fax)  Reason for call:   PA will always be needed for C2 medications due to patient insurance.  Please intiate PA for HYDROcodone-acetaminophen (NORCO/VICODIN) 5-325 MG tablet for this year as per the pharmacy.    Patient has not picked up 05/01/2018 Rx.   Patient being  pro active requesting another 15 day supply due to insurance covering only a 15 day supply at a time.

## 2018-04-29 NOTE — Telephone Encounter (Signed)
Patient states he has [employer] now and not Community education officer and he was advised that [place] healthcare will only pay for 7 tablet at a time, he is checking the status of this.

## 2018-04-30 ENCOUNTER — Other Ambulatory Visit: Payer: Self-pay | Admitting: Family Medicine

## 2018-04-30 DIAGNOSIS — M25562 Pain in left knee: Principal | ICD-10-CM

## 2018-04-30 DIAGNOSIS — G8929 Other chronic pain: Secondary | ICD-10-CM

## 2018-04-30 DIAGNOSIS — M25561 Pain in right knee: Principal | ICD-10-CM

## 2018-04-30 MED ORDER — HYDROCODONE-ACETAMINOPHEN 5-325 MG PO TABS: 1.0000 | ORAL_TABLET | Freq: Four times a day (QID) | ORAL | 0 refills | Status: DC | PRN

## 2018-04-30 NOTE — Telephone Encounter (Signed)
Patient notified. He will call ins to see if there is any way around this and let us know if we have to do anything.

## 2018-04-30 NOTE — Telephone Encounter (Signed)
done

## 2018-04-30 NOTE — Telephone Encounter (Signed)
Can you please send in a 15 day supply?  Looks like pharmacist in first message states that insurance will pay for a 15 day supply

## 2018-06-01 ENCOUNTER — Ambulatory Visit (INDEPENDENT_AMBULATORY_CARE_PROVIDER_SITE_OTHER): Payer: 59 | Admitting: Family Medicine

## 2018-06-01 ENCOUNTER — Encounter: Payer: Self-pay | Admitting: Family Medicine

## 2018-06-01 VITALS — BP 130/86 | HR 76 | Temp 98.0°F | Resp 16 | Ht 68.0 in | Wt 267.8 lb

## 2018-06-01 DIAGNOSIS — G8929 Other chronic pain: Secondary | ICD-10-CM

## 2018-06-01 DIAGNOSIS — E785 Hyperlipidemia, unspecified: Secondary | ICD-10-CM | POA: Diagnosis not present

## 2018-06-01 DIAGNOSIS — M25562 Pain in left knee: Secondary | ICD-10-CM

## 2018-06-01 DIAGNOSIS — I10 Essential (primary) hypertension: Secondary | ICD-10-CM | POA: Diagnosis not present

## 2018-06-01 DIAGNOSIS — M25561 Pain in right knee: Secondary | ICD-10-CM

## 2018-06-01 DIAGNOSIS — K625 Hemorrhage of anus and rectum: Secondary | ICD-10-CM | POA: Diagnosis not present

## 2018-06-01 DIAGNOSIS — Z79899 Other long term (current) drug therapy: Secondary | ICD-10-CM

## 2018-06-01 MED ORDER — HYDROCODONE-ACETAMINOPHEN 5-325 MG PO TABS: 1.0000 | ORAL_TABLET | Freq: Four times a day (QID) | ORAL | 0 refills | Status: DC | PRN

## 2018-06-01 NOTE — Assessment & Plan Note (Signed)
Encouraged heart healthy diet, increase exercise, avoid trans fats, consider a krill oil cap daily 

## 2018-06-01 NOTE — Assessment & Plan Note (Signed)
Well controlled, no changes to meds. Encouraged heart healthy diet such as the DASH diet and exercise as tolerated.  °

## 2018-06-01 NOTE — Patient Instructions (Signed)
Rectal Bleeding    Rectal bleeding is when blood passes out of the anus. People with rectal bleeding may notice bright red blood in their underwear or in the toilet after having a bowel movement. They may also have dark red or black stools.  Rectal bleeding is usually a sign that something is wrong. Many things can cause rectal bleeding, including:  · Hemorrhoids. These are blood vessels in the anus or rectum that are larger than normal.  · Fistulas. These are abnormal passages in the rectum and anus.  · Anal fissures. This is a tear in the anus.  · Diverticulosis. This is a condition in which pockets or sacs project from the bowel.  · Proctitis and colitis. These are conditions in which the rectum, colon, or anus become inflamed.  · Polyps. These are growths that can be cancerous (malignant) or non-cancerous (benign).  · Part of the rectum sticking out from the anus (rectal prolapse).  · Certain medicines.  · Intestinal infections.  Follow these instructions at home:  Pay attention to any changes in your symptoms. Take these actions to help lessen bleeding and discomfort:  · Eat a diet that is high in fiber. This will keep your stool soft, making it easier to pass stools without straining. Ask your health care provider what foods and drinks are high in fiber.  · Drink enough fluid to keep your urine clear or pale yellow. This also helps to keep your stool soft.  · Try taking a warm bath. This may help soothe any pain in your rectum.  · Keep all follow-up visits as told by your health care provider. This is important.  Get help right away if:  · You have new or increased rectal bleeding.  · You have black or dark red stools.  · You vomit blood or something that looks like coffee grounds.  · You have pain or tenderness in your abdomen.  · You have a fever.  · You feel weak.  · You feel nauseous.  · You faint.  · You have severe pain in your rectum.  · You cannot have a bowel movement.  This information is not  intended to replace advice given to you by your health care provider. Make sure you discuss any questions you have with your health care provider.  Document Released: 09/27/2001 Document Revised: 09/13/2015 Document Reviewed: 06/03/2015  Elsevier Interactive Patient Education © 2019 Elsevier Inc.

## 2018-06-01 NOTE — Assessment & Plan Note (Signed)
F/u ortho as needed for shots con't opiod for break through pain

## 2018-06-01 NOTE — Progress Notes (Signed)
Patient ID: Jonathan Hansen, male    DOB: [birth year]  Age: 43 y.o. MRN: [id number]    Subjective:  Subjective  HPI Jonathan Hansen presents for f/u pain meds.  He is doing better since seeing ortho and getting injections .  He needs less meds .  No other complaints.   Review of Systems  Constitutional: Negative for appetite change, diaphoresis, fatigue and unexpected weight change.  Eyes: Negative for pain, redness and visual disturbance.  Respiratory: Negative for cough, chest tightness, shortness of breath and wheezing.   Cardiovascular: Negative for chest pain, palpitations and leg swelling.  Endocrine: Negative for cold intolerance, heat intolerance, polydipsia, polyphagia and polyuria.  Genitourinary: Negative for difficulty urinating, dysuria and frequency.  Musculoskeletal: Positive for arthralgias.  Neurological: Negative for dizziness, light-headedness, numbness and headaches.    History Past Medical History:  Diagnosis Date  . Arthritis   . Back pain   . Chest pain   . Dyspnea   . Elevated liver enzymes   . Fatty liver   . HLD (hyperlipidemia)   . HTN (hypertension)   . Joint pain   . Leg edema   . Osteoarthritis     He has a past surgical history that includes Vasectomy and Knee arthroscopy.   His family history includes Coronary artery disease in his father; Stroke in his father.He reports that he has been smoking cigarettes. He has a 25.00 pack-year smoking history. He has never used smokeless tobacco. He reports current alcohol use. No history on file for drug.  Current Outpatient Medications on File Prior to Visit  Medication Sig Dispense Refill  . DULoxetine (CYMBALTA) 30 MG capsule Take 1 capsule (30 mg total) by mouth 2 (two) times daily. Take 1 cap by mouth daily for 1 week and then begin 30 mg BID 180 capsule 1  . Vitamin D, Ergocalciferol, (DRISDOL) 50000 units CAPS capsule Take 1 capsule (50,000 Units total) by mouth every 7 (seven) days. 4 capsule 0     No current facility-administered medications on file prior to visit.      Objective:  Objective  Physical Exam Vitals signs and nursing note reviewed.  Constitutional:      General: He is sleeping.     Appearance: He is well-developed.  HENT:     Head: Normocephalic and atraumatic.  Eyes:     Pupils: Pupils are equal, round, and reactive to light.  Neck:     Musculoskeletal: Normal range of motion and neck supple.     Thyroid: No thyromegaly.  Cardiovascular:     Rate and Rhythm: Normal rate and regular rhythm.     Heart sounds: No murmur.  Pulmonary:     Effort: Pulmonary effort is normal. No respiratory distress.     Breath sounds: Normal breath sounds. No wheezing or rales.  Chest:     Chest wall: No tenderness.  Musculoskeletal:        General: No tenderness.  Skin:    General: Skin is warm and dry.  Neurological:     Mental Status: He is oriented to person, place, and time.  Psychiatric:        Behavior: Behavior normal.        Thought Content: Thought content normal.        Judgment: Judgment normal.    BP 130/86 (BP Location: Left Arm, Cuff Size: Normal)   Pulse 76   Temp 98 F (36.7 C) (Oral)   Resp 16   Ht 5\' 8"  (1.727 m)  Wt 267 lb 12.8 oz (121.5 kg)   SpO2 97%   BMI 40.72 kg/m  Wt Readings from Last 3 Encounters:  06/01/18 267 lb 12.8 oz (121.5 kg)  02/16/18 256 lb 6.4 oz (116.3 kg)  02/09/18 250 lb (113.4 kg)     Lab Results  Component Value Date   WBC 5.3 08/13/2017   HGB 14.7 08/13/2017   HCT 43.2 08/13/2017   PLT 216.0 11/11/2016   GLUCOSE 97 01/12/2018   CHOL 175 08/13/2017   TRIG 296 (H) 08/13/2017   HDL 39 (L) 08/13/2017   LDLDIRECT 46.0 05/19/2017   LDLCALC 77 08/13/2017   ALT 26 01/12/2018   AST 20 01/12/2018   NA 142 01/12/2018   K 4.2 01/12/2018   CL 101 01/12/2018   CREATININE 0.85 01/12/2018   BUN 14 01/12/2018   CO2 24 01/12/2018   TSH 1.570 08/13/2017   HGBA1C 5.3 01/12/2018    No results found.    Assessment & Plan:  Plan  I am having Jonathan Hansen maintain his Vitamin D (Ergocalciferol), DULoxetine, HYDROcodone-acetaminophen, HYDROcodone-acetaminophen, and HYDROcodone-acetaminophen.  Meds ordered this encounter  Medications  . HYDROcodone-acetaminophen (NORCO/VICODIN) 5-325 MG tablet    Sig: Take 1 tablet by mouth every 6 (six) hours as needed for moderate pain.    Dispense:  60 tablet    Refill:  0  . HYDROcodone-acetaminophen (NORCO/VICODIN) 5-325 MG tablet    Sig: Take 1 tablet by mouth every 6 (six) hours as needed for moderate pain.    Dispense:  60 tablet    Refill:  0    Do not fill until  April 2020  . HYDROcodone-acetaminophen (NORCO/VICODIN) 5-325 MG tablet    Sig: Take 1 tablet by mouth every 6 (six) hours as needed for moderate pain.    Dispense:  30 tablet    Refill:  0    Do not fill until March 2020    Problem List Items Addressed This Visit      Unprioritized   Chronic pain of both knees    F/u ortho as needed for shots con't opiod for break through pain      Relevant Medications   HYDROcodone-acetaminophen (NORCO/VICODIN) 5-325 MG tablet   HYDROcodone-acetaminophen (NORCO/VICODIN) 5-325 MG tablet   HYDROcodone-acetaminophen (NORCO/VICODIN) 5-325 MG tablet   Essential hypertension    Well controlled, no changes to meds. Encouraged heart healthy diet such as the DASH diet and exercise as tolerated.       HLD (hyperlipidemia)    Encouraged heart healthy diet, increase exercise, avoid trans fats, consider a krill oil cap daily       Other Visit Diagnoses    Rectal bleeding    -  Primary   Relevant Orders   Ambulatory referral to Gastroenterology      Follow-up: Return in about 3 months (around 08/30/2018), or if symptoms worsen or fail to improve.  Donato SchultzYvonne R Lowne Chase, DO

## 2018-06-02 ENCOUNTER — Other Ambulatory Visit: Payer: 59

## 2018-06-02 ENCOUNTER — Encounter: Payer: Self-pay | Admitting: Gastroenterology

## 2018-06-02 DIAGNOSIS — M25561 Pain in right knee: Principal | ICD-10-CM

## 2018-06-02 DIAGNOSIS — M25562 Pain in left knee: Principal | ICD-10-CM

## 2018-06-02 DIAGNOSIS — G8929 Other chronic pain: Secondary | ICD-10-CM

## 2018-06-02 DIAGNOSIS — Z79899 Other long term (current) drug therapy: Secondary | ICD-10-CM

## 2018-06-02 NOTE — Addendum Note (Signed)
Addended by: Thelma BargeICHARDSON, Lucerito Rosinski D on: 06/02/2018 08:04 AM   Modules accepted: Orders

## 2018-06-02 NOTE — Addendum Note (Signed)
Addended by: Mervin Kung A on: 06/02/2018 08:15 AM   Modules accepted: Orders

## 2018-06-02 NOTE — Progress Notes (Signed)
Pt gave sample on 06/01/18 but order was not placed until the next day. Sample was held overnight in refrigerator to send out for testing the next day.

## 2018-06-04 LAB — PAIN MGMT, PROFILE 8 W/CONF, U
6 Acetylmorphine: NEGATIVE ng/mL (ref <10)
Alcohol Metabolites: NEGATIVE ng/mL (ref <500)
Amphetamines: NEGATIVE ng/mL (ref <500)
BUPRENORPHINE, URINE: NEGATIVE ng/mL (ref <5)
Benzodiazepines: NEGATIVE ng/mL (ref <100)
Cocaine Metabolite: NEGATIVE ng/mL (ref <150)
Codeine: NEGATIVE ng/mL (ref <50)
Creatinine: 54.8 mg/dL
Hydrocodone: NEGATIVE ng/mL (ref <50)
Hydromorphone: NEGATIVE ng/mL (ref <50)
MDMA: NEGATIVE ng/mL (ref <500)
Marijuana Metabolite: NEGATIVE ng/mL (ref <20)
Morphine: NEGATIVE ng/mL (ref <50)
Norhydrocodone: NEGATIVE ng/mL (ref <50)
Opiates: NEGATIVE ng/mL (ref <100)
Oxidant: NEGATIVE ug/mL (ref <200)
Oxycodone: NEGATIVE ng/mL (ref <100)
pH: 6.64 (ref 4.5–9.0)

## 2018-06-22 ENCOUNTER — Other Ambulatory Visit: Payer: Self-pay | Admitting: Family Medicine

## 2018-06-22 ENCOUNTER — Encounter: Payer: Self-pay | Admitting: Gastroenterology

## 2018-06-22 ENCOUNTER — Ambulatory Visit: Payer: 59 | Admitting: Gastroenterology

## 2018-06-22 VITALS — BP 128/82 | HR 80 | Ht 67.5 in | Wt 277.2 lb

## 2018-06-22 DIAGNOSIS — K5909 Other constipation: Secondary | ICD-10-CM | POA: Diagnosis not present

## 2018-06-22 DIAGNOSIS — K625 Hemorrhage of anus and rectum: Secondary | ICD-10-CM

## 2018-06-22 MED ORDER — NA SULFATE-K SULFATE-MG SULF 17.5-3.13-1.6 GM/177ML PO SOLN: 1.0000 | Freq: Once | ORAL | 0 refills | Status: AC

## 2018-06-22 NOTE — Progress Notes (Signed)
[facility] Consult Note:  History: Jonathan Hansen 06/22/2018  Referring physician: Zola Button, Grayling Congress, DO  Reason for consult/chief complaint: Rectal Bleeding (BRB on the toilet paper and in the toilet since 2017, now intermittent and if taking Ibuprofen will bleed) and Constipation (intermittent, taking pain meds)   Subjective  HPI:  This is a very pleasant 43 year old man referred by primary care for rectal bleeding and intermittent constipation.  For the last several years he has intermittent blood on the paper or toilet bowl, more so with episodes of constipation.  He seemed to have more difficulty with constipation when he went on a protein, low carbohydrate diet.  There will occasionally be pain with a bowel movement.  He denies abdominal pain, nausea, vomiting, heartburn, dysphagia.  He had purposeful 40 pound weight loss after going to a weight loss clinic, going on the diet and taking metformin.   ROS:  Review of Systems  Constitutional: Negative for appetite change and unexpected weight change.  HENT: Negative for mouth sores and voice change.   Eyes: Negative for pain and redness.  Respiratory: Negative for cough and shortness of breath.   Cardiovascular: Negative for chest pain and palpitations.  Genitourinary: Negative for dysuria and hematuria.  Musculoskeletal: Positive for arthralgias. Negative for myalgias.       Bilateral knee pain from arthritis.  He previously took NSAIDs, but he would seem to bleed more often when he took high-dose ibuprofen.  He now takes an oxycodone before bed, sometimes a few doses on weekends when he is more active and off work.  Skin: Negative for pallor and rash.  Neurological: Negative for weakness and headaches.  Hematological: Negative for adenopathy.     Past Medical History: Past Medical History:  Diagnosis Date  . Arthritis   . Back pain   . Chest pain   . Chronic knee pain   . Depression   .  Dyspnea   . Elevated liver enzymes   . Fatty liver   . HLD (hyperlipidemia)   . HTN (hypertension)   . Joint pain   . Leg edema   . Osteoarthritis   . Pre-diabetes   . Vitamin D deficiency      Past Surgical History: Past Surgical History:  Procedure Laterality Date  . KNEE ARTHROSCOPY Right   . VASECTOMY       Family History: Family History  Problem Relation Age of Onset  . Stroke Father   . Coronary artery disease Father        carotid stenosis  . Diabetes Father   . Diabetes Paternal Grandfather     Social History: Social History   Socioeconomic History  . Marital status: Married    Spouse name: April Barstow  . Number of children: 1  . Years of education: Not on file  . Highest education level: Not on file  Occupational History  . 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 Financial resource strain: Not on file  . Food insecurity:    Worry: Not on file    Inability: Not on file  . Transportation needs:    Medical: Not on file    Non-medical: Not on file  Tobacco Use  . Smoking status: Current Every Day Smoker    Packs/day: 1.00    Years: 25.00    Pack years: 25.00    Types: Cigarettes, E-cigarettes  . Smokeless tobacco: Never Used  . Tobacco comment: now vape no cigarettes  Substance and  Sexual Activity  . Alcohol use: Not Currently    Comment: nothing in months 06/22/2018  . Drug use: Not on file  . Sexual activity: Not on file  Lifestyle  . Physical activity:    Days per week: Not on file    Minutes per session: Not on file  . Stress: Not on file  Relationships  . Social connections:    Talks on phone: Not on file    Gets together: Not on file    Attends religious service: Not on file    Active member of club or organization: Not on file    Attends meetings of clubs or organizations: Not on file    Relationship status: Not on file  Other Topics Concern  . Not on file  Social History Narrative  . Not on file     Allergies: Allergies  Allergen Reactions  . Ibuprofen Other (See Comments)    Bleeding  . Penicillins     Outpatient Meds: Current Outpatient Medications  Medication Sig Dispense Refill  . HYDROcodone-acetaminophen (NORCO/VICODIN) 5-325 MG tablet Take 1 tablet by mouth every 6 (six) hours as needed for moderate pain. 60 tablet 0  . HYDROcodone-acetaminophen (NORCO/VICODIN) 5-325 MG tablet Take 1 tablet by mouth every 6 (six) hours as needed for moderate pain. 60 tablet 0  . HYDROcodone-acetaminophen (NORCO/VICODIN) 5-325 MG tablet Take 1 tablet by mouth every 6 (six) hours as needed for moderate pain. 30 tablet 0   No current facility-administered medications for this visit.       ___________________________________________________________________ Objective   Exam:  BP 128/82 (BP Location: Left Arm, Patient Position: Sitting, Cuff Size: Normal)   Pulse 80   Ht 5' 7.5" (1.715 m) Comment: height measured without shoes  Wt 277 lb 4 oz (125.8 kg)   BMI 42.78 kg/m    General: Well-appearing man  Eyes: sclera anicteric, no redness  ENT: oral mucosa moist without lesions, no cervical or supraclavicular lymphadenopathy  CV: RRR without murmur, S1/S2, no JVD, no peripheral edema  Resp: clear to auscultation bilaterally, normal RR and effort noted  GI: soft, obese, no tenderness, with active bowel sounds. No guarding or palpable organomegaly noted, limited by body habitus  Skin; warm and dry, no rash or jaundice noted  Neuro: awake, alert and oriented x 3. Normal gross motor function and fluent speech Rectal normal external, no fissure or tenderness, soft stool rectal vault, no palpable internal lesions.  Labs:  No recent CBC.   Assessment: Encounter Diagnoses  Name Primary?  . Rectal bleeding Yes  . Chronic constipation    Recommend as needed use of low-dose MiraLAX on days when he is taking more frequent oxycodone dosing for his arthritis.  I advised him to  have a colonoscopy to investigate the source of bleeding.  It sounds likely to be royal or other benign anorectal cause of bleeding.  However, neoplasia should be ruled out.  He is agreeable after discussion of procedure and risks.  The benefits and risks of the planned procedure were described in detail with the patient or (when appropriate) their health care proxy.  Risks were outlined as including, but not limited to, bleeding, infection, perforation, adverse medication reaction leading to cardiac or pulmonary decompensation.  The limitation of incomplete mucosal visualization was also discussed.  No guarantees or warranties were given.   Thank you for the courtesy of this consult.  Please call me with any questions or concerns.  Charlie Pitter III  CC: Referring provider  noted above

## 2018-06-22 NOTE — Patient Instructions (Signed)
If you are age 45 or older, your body mass index should be between 23-30. Your Body mass index is 42.78 kg/m. If this is out of the aforementioned range listed, please consider follow up with your Primary Care Provider.  If you are age 67 or younger, your body mass index should be between 19-25. Your Body mass index is 42.78 kg/m. If this is out of the aformentioned range listed, please consider follow up with your Primary Care Provider.   You have been scheduled for a colonoscopy. Please follow written instructions given to you at your visit today.  Please pick up your prep supplies at the pharmacy within the next 1-3 days. If you use inhalers (even only as needed), please bring them with you on the day of your procedure. Your physician has requested that you go to www.startemmi.com and enter the access code given to you at your visit today. This web site gives a general overview about your procedure. However, you should still follow specific instructions given to you by our office regarding your preparation for the procedure.  It was a pleasure to see you today!  Dr. Myrtie Neither

## 2018-07-20 ENCOUNTER — Encounter: Payer: 59 | Admitting: Gastroenterology

## 2018-09-03 ENCOUNTER — Encounter: Payer: Self-pay | Admitting: Family Medicine

## 2018-09-03 ENCOUNTER — Ambulatory Visit (INDEPENDENT_AMBULATORY_CARE_PROVIDER_SITE_OTHER): Payer: 59 | Admitting: Family Medicine

## 2018-09-03 ENCOUNTER — Other Ambulatory Visit: Payer: Self-pay

## 2018-09-03 DIAGNOSIS — M25562 Pain in left knee: Secondary | ICD-10-CM | POA: Diagnosis not present

## 2018-09-03 DIAGNOSIS — G8929 Other chronic pain: Secondary | ICD-10-CM | POA: Diagnosis not present

## 2018-09-03 DIAGNOSIS — M25561 Pain in right knee: Secondary | ICD-10-CM

## 2018-09-03 MED ORDER — HYDROCODONE-ACETAMINOPHEN 5-325 MG PO TABS: 1.0000 | ORAL_TABLET | Freq: Four times a day (QID) | ORAL | 0 refills | Status: DC | PRN

## 2018-09-03 NOTE — Progress Notes (Signed)
Virtual Visit via Video Note  I connected with Jonathan Hansen on 09/03/18 at  3:00 PM EDT by a video enabled telemedicine application and verified that I am speaking with the correct person using two identifiers.  Location: Patient: home Provider: office   I discussed the limitations of evaluation and management by telemedicine and the availability of in person appointments. The patient expressed understanding and agreed to proceed.  History of Present Illness: Pt is home with no complaints He needs refills on his meds    Observations/Objective: Today's Vitals   09/03/18 1453  BP: 129/89  Temp: (!) 96.8 F (36 C)  TempSrc: Oral  Weight: 274 lb 9.6 oz (124.6 kg)   Body mass index is 42.37 kg/m. Pt is in NAD  Assessment and Plan: 1. Chronic pain of both knees Stable Pt to see ortho soon for more injections  - HYDROcodone-acetaminophen (NORCO/VICODIN) 5-325 MG tablet; Take 1 tablet by mouth every 6 (six) hours as needed for moderate pain.  Dispense: 60 tablet; Refill: 0 - HYDROcodone-acetaminophen (NORCO/VICODIN) 5-325 MG tablet; Take 1 tablet by mouth every 6 (six) hours as needed for moderate pain.  Dispense: 60 tablet; Refill: 0 - HYDROcodone-acetaminophen (NORCO/VICODIN) 5-325 MG tablet; Take 1 tablet by mouth every 6 (six) hours as needed for moderate pain.  Dispense: 30 tablet; Refill: 0   Follow Up Instructions:    I discussed the assessment and treatment plan with the patient. The patient was provided an opportunity to ask questions and all were answered. The patient agreed with the plan and demonstrated an understanding of the instructions.   The patient was advised to call back or seek an in-person evaluation if the symptoms worsen or if the condition fails to improve as anticipated.  I provided 20 minutes of non-face-to-face time during this encounter.   Donato Schultz, DO

## 2018-09-25 ENCOUNTER — Encounter: Payer: Self-pay | Admitting: Family Medicine

## 2018-09-28 ENCOUNTER — Other Ambulatory Visit: Payer: Self-pay

## 2018-09-28 ENCOUNTER — Encounter: Payer: Self-pay | Admitting: Family Medicine

## 2018-09-28 ENCOUNTER — Ambulatory Visit (INDEPENDENT_AMBULATORY_CARE_PROVIDER_SITE_OTHER): Payer: 59 | Admitting: Family Medicine

## 2018-09-28 DIAGNOSIS — L03012 Cellulitis of left finger: Secondary | ICD-10-CM

## 2018-09-28 MED ORDER — DOXYCYCLINE HYCLATE 100 MG PO TABS: 100.0000 mg | ORAL_TABLET | Freq: Two times a day (BID) | ORAL | 0 refills | Status: DC

## 2018-09-28 NOTE — Progress Notes (Deleted)
Patient ID: Jonathan Hansen, male    DOB: [birth year]  Age: 43 y.o. MRN: [id number]    Subjective:  Subjective  HPI Jonathan Hansen presents for painful red, middle finger      Review of Systems  Constitutional: Negative for chills and fever.  HENT: Negative for congestion and hearing loss.   Eyes: Negative for discharge.  Respiratory: Negative for cough and shortness of breath.   Cardiovascular: Negative for chest pain, palpitations and leg swelling.  Gastrointestinal: Negative for abdominal pain, blood in stool, constipation, diarrhea, nausea and vomiting.  Genitourinary: Negative for dysuria, frequency, hematuria and urgency.  Musculoskeletal: Negative for back pain and myalgias.  Skin: Positive for rash and wound.  Allergic/Immunologic: Negative for environmental allergies.  Neurological: Negative for dizziness, weakness and headaches.  Hematological: Does not bruise/bleed easily.  Psychiatric/Behavioral: Negative for suicidal ideas. The patient is not nervous/anxious.     History Past Medical History:  Diagnosis Date  . Arthritis   . Back pain   . Chest pain   . Chronic knee pain   . Depression   . Dyspnea   . Elevated liver enzymes   . Fatty liver   . HLD (hyperlipidemia)   . HTN (hypertension)   . Joint pain   . Leg edema   . Osteoarthritis   . Pre-diabetes   . Vitamin D deficiency     He has a past surgical history that includes Vasectomy and Knee arthroscopy (Right).   His family history includes Coronary artery disease in his father; Diabetes in his father and paternal grandfather; Stroke in his father.He reports that he has been smoking cigarettes and e-cigarettes. He has a 25.00 pack-year smoking history. He has never used smokeless tobacco. He reports previous alcohol use. No history on file for drug.  Current Outpatient Medications on File Prior to Visit  Medication Sig Dispense Refill  . HYDROcodone-acetaminophen (NORCO/VICODIN) 5-325 MG tablet Take 1  tablet by mouth every 6 (six) hours as needed for moderate pain. 60 tablet 0  . HYDROcodone-acetaminophen (NORCO/VICODIN) 5-325 MG tablet Take 1 tablet by mouth every 6 (six) hours as needed for moderate pain. 60 tablet 0  . HYDROcodone-acetaminophen (NORCO/VICODIN) 5-325 MG tablet Take 1 tablet by mouth every 6 (six) hours as needed for moderate pain. 30 tablet 0   No current facility-administered medications on file prior to visit.      Objective:  Objective  Physical Exam Vitals signs and nursing note reviewed.  Constitutional:      General: He is sleeping.     Appearance: He is well-developed.  HENT:     Head: Normocephalic and atraumatic.  Eyes:     Pupils: Pupils are equal, round, and reactive to light.  Neck:     Musculoskeletal: Normal range of motion and neck supple.     Thyroid: No thyromegaly.  Cardiovascular:     Rate and Rhythm: Normal rate and regular rhythm.     Heart sounds: No murmur.  Pulmonary:     Effort: Pulmonary effort is normal. No respiratory distress.     Breath sounds: Normal breath sounds. No wheezing or rales.  Chest:     Chest wall: No tenderness.  Musculoskeletal:        General: No tenderness.  Skin:    General: Skin is warm and dry.     Findings: Erythema and rash present.  Neurological:     Mental Status: He is oriented to person, place, and time.  Psychiatric:  Behavior: Behavior normal.        Thought Content: Thought content normal.        Judgment: Judgment normal.    There were no vitals taken for this visit. Wt Readings from Last 3 Encounters:  09/03/18 274 lb 9.6 oz (124.6 kg)  06/22/18 277 lb 4 oz (125.8 kg)  06/01/18 267 lb 12.8 oz (121.5 kg)     Lab Results  Component Value Date   WBC 5.3 08/13/2017   HGB 14.7 08/13/2017   HCT 43.2 08/13/2017   PLT 216.0 11/11/2016   GLUCOSE 97 01/12/2018   CHOL 175 08/13/2017   TRIG 296 (H) 08/13/2017   HDL 39 (L) 08/13/2017   LDLDIRECT 46.0 05/19/2017   LDLCALC 77  08/13/2017   ALT 26 01/12/2018   AST 20 01/12/2018   NA 142 01/12/2018   K 4.2 01/12/2018   CL 101 01/12/2018   CREATININE 0.85 01/12/2018   BUN 14 01/12/2018   CO2 24 01/12/2018   TSH 1.570 08/13/2017   HGBA1C 5.3 01/12/2018    No results found.   Assessment & Plan:  Plan  I am having Jonathan Hansen start on doxycycline. I am also having him maintain his HYDROcodone-acetaminophen, HYDROcodone-acetaminophen, and HYDROcodone-acetaminophen.  Meds ordered this encounter  Medications  . doxycycline (VIBRA-TABS) 100 MG tablet    Sig: Take 1 tablet (100 mg total) by mouth 2 (two) times daily.    Dispense:  20 tablet    Refill:  0    Problem List Items Addressed This Visit    None    Visit Diagnoses    Paronychia of finger of left hand    -  Primary   Relevant Medications   doxycycline (VIBRA-TABS) 100 MG tablet      Follow-up: No follow-ups on file.  Ann Held, DO

## 2018-10-04 NOTE — Progress Notes (Signed)
Virtual Visit via Video Note  I connected with Jonathan Hansen on 10/04/18 at  3:00 PM EDT by a video enabled telemedicine application and verified that I am speaking with the correct person using two identifiers.  Location: Patient: home Provider: home   I discussed the limitations of evaluation and management by telemedicine and the availability of in person appointments. The patient expressed understanding and agreed to proceed.  History of Present Illness: Pt is home c/o swollen L middle finger -- no known injury No drainage  Very tender to touch    Observations/Objective:  No vitals obtained L mid finger swollen and red Tender to touch per pt  Assessment and Plan: 1. Paronychia of finger of left hand abx per orders  rto prn  - doxycycline (VIBRA-TABS) 100 MG tablet; Take 1 tablet (100 mg total) by mouth 2 (two) times daily.  Dispense: 20 tablet; Refill: 0   Follow Up Instructions:    I discussed the assessment and treatment plan with the patient. The patient was provided an opportunity to ask questions and all were answered. The patient agreed with the plan and demonstrated an understanding of the instructions.   The patient was advised to call back or seek an in-person evaluation if the symptoms worsen or if the condition fails to improve as anticipated.  I provided 15 minutes of non-face-to-face time during this encounter.   Ann Held, DO

## 2018-10-05 ENCOUNTER — Encounter: Payer: Self-pay | Admitting: Family Medicine

## 2018-10-05 NOTE — Telephone Encounter (Signed)
If the finger is better I do not need to do anything else Try pepcid for the stomach ---- we can bring him in the office if it does not help

## 2018-10-10 ENCOUNTER — Encounter: Payer: Self-pay | Admitting: Family Medicine

## 2018-10-10 ENCOUNTER — Other Ambulatory Visit: Payer: Self-pay | Admitting: Family Medicine

## 2018-10-10 DIAGNOSIS — G8929 Other chronic pain: Secondary | ICD-10-CM

## 2018-10-10 DIAGNOSIS — M25562 Pain in left knee: Secondary | ICD-10-CM

## 2018-10-11 ENCOUNTER — Encounter: Payer: Self-pay | Admitting: Family Medicine

## 2018-10-11 ENCOUNTER — Other Ambulatory Visit: Payer: Self-pay | Admitting: Family Medicine

## 2018-10-11 DIAGNOSIS — M25562 Pain in left knee: Secondary | ICD-10-CM

## 2018-10-11 DIAGNOSIS — G8929 Other chronic pain: Secondary | ICD-10-CM

## 2018-10-11 MED ORDER — HYDROCODONE-ACETAMINOPHEN 5-325 MG PO TABS: 1.0000 | ORAL_TABLET | Freq: Four times a day (QID) | ORAL | 0 refills | Status: DC | PRN

## 2018-10-11 NOTE — Telephone Encounter (Signed)
The other 2 were written for #60---- does he have enough to get to July-- because script says do not fill until July--- no day  If we need to send more in we will ----that was a mistake and I"m sorry

## 2018-11-04 ENCOUNTER — Encounter: Payer: Self-pay | Admitting: Family Medicine

## 2018-11-17 ENCOUNTER — Other Ambulatory Visit: Payer: Self-pay | Admitting: Family Medicine

## 2018-11-17 DIAGNOSIS — M25562 Pain in left knee: Secondary | ICD-10-CM

## 2018-11-17 DIAGNOSIS — G8929 Other chronic pain: Secondary | ICD-10-CM

## 2018-11-17 NOTE — Telephone Encounter (Signed)
Requesting: NORCO Contract: 11/16/2017 UDS: 06/01/2018, low risk Last OV: 09/28/2018 Next OV: 12/30/2018 Last Refill: 10/11/2018, #60--0 RF Database:   Please advise

## 2018-11-18 MED ORDER — HYDROCODONE-ACETAMINOPHEN 5-325 MG PO TABS: 1.0000 | ORAL_TABLET | Freq: Four times a day (QID) | ORAL | 0 refills | Status: DC | PRN

## 2018-12-15 ENCOUNTER — Other Ambulatory Visit: Payer: Self-pay | Admitting: Family Medicine

## 2018-12-15 DIAGNOSIS — G8929 Other chronic pain: Secondary | ICD-10-CM

## 2018-12-15 MED ORDER — HYDROCODONE-ACETAMINOPHEN 5-325 MG PO TABS: 1.0000 | ORAL_TABLET | Freq: Four times a day (QID) | ORAL | 0 refills | Status: DC | PRN

## 2018-12-15 NOTE — Telephone Encounter (Signed)
Hydrocodone refill.   Last OV: 09/28/2018 Last Fill: 11/18/2018 #60 and 0RF UDS: 06/01/2018 Low risk?

## 2018-12-30 ENCOUNTER — Ambulatory Visit (INDEPENDENT_AMBULATORY_CARE_PROVIDER_SITE_OTHER): Payer: 59 | Admitting: Family Medicine

## 2018-12-30 ENCOUNTER — Encounter: Payer: Self-pay | Admitting: Family Medicine

## 2018-12-30 ENCOUNTER — Other Ambulatory Visit: Payer: Self-pay

## 2018-12-30 DIAGNOSIS — M25562 Pain in left knee: Secondary | ICD-10-CM | POA: Diagnosis not present

## 2018-12-30 DIAGNOSIS — M25561 Pain in right knee: Secondary | ICD-10-CM

## 2018-12-30 DIAGNOSIS — G8929 Other chronic pain: Secondary | ICD-10-CM

## 2018-12-30 IMAGING — NM NM MISC PROCEDURE
3 series · 18 of 18 positions shown · non-contrast
Comparison: none

[Series 1: wbr_r-proj_st rest_(id)_sa · 6.5mm · 6.51mm/px · 6 of 64 frames shown]
[frame 6/64]
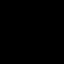
[frame 16/64]
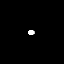
[frame 27/64]
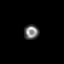
[frame 38/64]
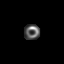
[frame 48/64]
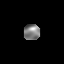
[frame 59/64]
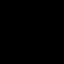

[Series 1: wbr_s-proj_st stress_(id)_sa · 6.5mm · 6.51mm/px · 6 of 64 frames shown (1 of 2)]
[frame 6/64]
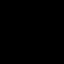
[frame 16/64]
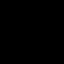
[frame 27/64]
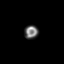
[frame 38/64]
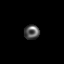
[frame 48/64]
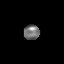
[frame 59/64]
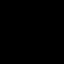

[Series 1: wbr_s-proj_st stress_(id)_sa · 6.5mm · 6.51mm/px · 6 of 512 frames shown (2 of 2)]
[frame 43/512]
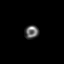
[frame 128/512]
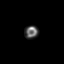
[frame 214/512]
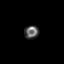
[frame 299/512]
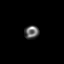
[frame 384/512]
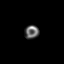
[frame 470/512]
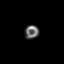

[18 of 18 positions shown; findings below may reference images not displayed]

Canned report from images found in remote index.

Refer to host system for actual result text.

## 2018-12-30 MED ORDER — HYDROCODONE-ACETAMINOPHEN 5-325 MG PO TABS: 1.0000 | ORAL_TABLET | Freq: Four times a day (QID) | ORAL | 0 refills | Status: DC | PRN

## 2018-12-30 NOTE — Progress Notes (Signed)
Virtual Visit via Video Note  I connected with Paris Lore on 12/30/18 at  4:00 PM EDT by a video enabled telemedicine application and verified that I am speaking with the correct person using two identifiers.  Location: Patient: home Provider: office   I discussed the limitations of evaluation and management by telemedicine and the availability of in person appointments. The patient expressed understanding and agreed to proceed.  History of Present Illness: Pt is home and just needs a refill on his pain meds    Pt will get the gel injections starting next week  Observations/Objective: No vitals obtained  Pt in NAd   Assessment and Plan: 1. Chronic pain of both knees Worsening F/u ortho for injections Pain med refilled  - HYDROcodone-acetaminophen (NORCO/VICODIN) 5-325 MG tablet; Take 1 tablet by mouth every 6 (six) hours as needed for moderate pain.  Dispense: 60 tablet; Refill: 0   Follow Up Instructions:    I discussed the assessment and treatment plan with the patient. The patient was provided an opportunity to ask questions and all were answered. The patient agreed with the plan and demonstrated an understanding of the instructions.   The patient was advised to call back or seek an in-person evaluation if the symptoms worsen or if the condition fails to improve as anticipated.  I p15rovided 15 minutes of non-face-to-face time during this encounter.   Ann Held, DO

## 2019-02-07 ENCOUNTER — Telehealth: Payer: Self-pay

## 2019-02-07 ENCOUNTER — Encounter: Payer: Self-pay | Admitting: Family Medicine

## 2019-02-07 NOTE — Telephone Encounter (Signed)
Copied from [place] [id number]. Topic: Quick Communication - Rx Refill/Question >> Feb 07, 2019  1:16 PM Carolyn Stare wrote: Medication HYDROcodone-acetaminophen (NORCO/VICODIN) 5-325 MG tablet   Preferred Pharmacy [facility]   Agent: Please be advised that RX refills may take up to 3 business days. We ask that you follow-up with your pharmacy.

## 2019-02-07 NOTE — Telephone Encounter (Signed)
Requesting: NORCO Contract: 11/16/2017 UDS: 06/01/2018, low risk Last OV: 12/30/2018 Next OV: N/A Last Refill: 12/30/2018, #30--0 RF Database:   Please advise

## 2019-02-08 ENCOUNTER — Other Ambulatory Visit: Payer: Self-pay | Admitting: Family Medicine

## 2019-02-08 ENCOUNTER — Encounter: Payer: Self-pay | Admitting: Family Medicine

## 2019-02-08 DIAGNOSIS — G8929 Other chronic pain: Secondary | ICD-10-CM

## 2019-02-08 DIAGNOSIS — M25562 Pain in left knee: Secondary | ICD-10-CM

## 2019-02-08 MED ORDER — BUPROPION HCL ER (XL) 150 MG PO TB24: ORAL_TABLET | ORAL | 0 refills | Status: DC

## 2019-02-08 NOTE — Telephone Encounter (Signed)
I have sent in the wellbutrin F/u virtual or in person 1 month

## 2019-02-09 NOTE — Telephone Encounter (Signed)
Requesting: NORCO Contract: 11/16/2017 UDS: 06/01/2018, low risk Last OV: 12/30/2018 Next OV: 03/04/2019 Last Refill: 12/30/2018, #60--0 RF Database:   Please advise

## 2019-02-10 MED ORDER — HYDROCODONE-ACETAMINOPHEN 5-325 MG PO TABS: 1.0000 | ORAL_TABLET | Freq: Four times a day (QID) | ORAL | 0 refills | Status: DC | PRN

## 2019-03-02 ENCOUNTER — Other Ambulatory Visit: Payer: Self-pay | Admitting: Family Medicine

## 2019-03-04 ENCOUNTER — Other Ambulatory Visit: Payer: Self-pay

## 2019-03-04 ENCOUNTER — Encounter: Payer: Self-pay | Admitting: Family Medicine

## 2019-03-04 ENCOUNTER — Ambulatory Visit (INDEPENDENT_AMBULATORY_CARE_PROVIDER_SITE_OTHER): Payer: 59 | Admitting: Family Medicine

## 2019-03-04 DIAGNOSIS — M25561 Pain in right knee: Secondary | ICD-10-CM | POA: Diagnosis not present

## 2019-03-04 DIAGNOSIS — M25562 Pain in left knee: Secondary | ICD-10-CM | POA: Diagnosis not present

## 2019-03-04 DIAGNOSIS — G8929 Other chronic pain: Secondary | ICD-10-CM

## 2019-03-04 MED ORDER — HYDROCODONE-ACETAMINOPHEN 5-325 MG PO TABS: 1.0000 | ORAL_TABLET | Freq: Four times a day (QID) | ORAL | 0 refills | Status: DC | PRN

## 2019-03-04 NOTE — Progress Notes (Signed)
Virtual Visit via Video Note  I connected with Jonathan Hansen on 03/04/19 at  3:00 PM EST by a video enabled telemedicine application and verified that I am speaking with the correct person using two identifiers.  Location: Patient: in car in parking lot of [facility] Provider: office    I discussed the limitations of evaluation and management by telemedicine and the availability of in person appointments. The patient expressed understanding and agreed to proceed.  History of Present Illness: Pt is in his car -- needs refill on his pain meds    He will have both knees replaced after the first of the year  He needs to lose 40 lbs    Observations/Objective: No vitals obtained  Pt is in NAD  Assessment and Plan: 1. Chronic pain of both knees Refill meds  Database reviewed rto 3 months - HYDROcodone-acetaminophen (NORCO/VICODIN) 5-325 MG tablet; Take 1 tablet by mouth every 6 (six) hours as needed for moderate pain.  Dispense: 60 tablet; Refill: 0   Follow Up Instructions:    I discussed the assessment and treatment plan with the patient. The patient was provided an opportunity to ask questions and all were answered. The patient agreed with the plan and demonstrated an understanding of the instructions.   The patient was advised to call back or seek an in-person evaluation if the symptoms worsen or if the condition fails to improve as anticipated.  I provided 15 minutes of non-face-to-face time during this encounter.   Ann Held, DO

## 2019-04-04 ENCOUNTER — Other Ambulatory Visit: Payer: Self-pay | Admitting: Family Medicine

## 2019-04-04 DIAGNOSIS — G8929 Other chronic pain: Secondary | ICD-10-CM

## 2019-04-04 DIAGNOSIS — M25562 Pain in left knee: Secondary | ICD-10-CM

## 2019-04-05 MED ORDER — HYDROCODONE-ACETAMINOPHEN 5-325 MG PO TABS: 1.0000 | ORAL_TABLET | Freq: Four times a day (QID) | ORAL | 0 refills | Status: DC | PRN

## 2019-04-05 NOTE — Telephone Encounter (Signed)
Hydrocodone refill.   Last OV: 03/04/2019 Last Fill: 03/04/2019 #60 and 0RF Pt sig: 1 tab q6h prn UDS: 06/01/2018

## 2019-05-01 ENCOUNTER — Other Ambulatory Visit: Payer: Self-pay | Admitting: Family Medicine

## 2019-05-01 ENCOUNTER — Encounter: Payer: Self-pay | Admitting: Family Medicine

## 2019-05-01 DIAGNOSIS — G8929 Other chronic pain: Secondary | ICD-10-CM

## 2019-05-01 DIAGNOSIS — M25562 Pain in left knee: Secondary | ICD-10-CM

## 2019-05-02 ENCOUNTER — Other Ambulatory Visit: Payer: Self-pay | Admitting: Family Medicine

## 2019-05-02 DIAGNOSIS — G8929 Other chronic pain: Secondary | ICD-10-CM

## 2019-05-02 DIAGNOSIS — M25561 Pain in right knee: Secondary | ICD-10-CM

## 2019-05-02 MED ORDER — HYDROCODONE-ACETAMINOPHEN 5-325 MG PO TABS: 1.0000 | ORAL_TABLET | Freq: Four times a day (QID) | ORAL | 0 refills | Status: DC | PRN

## 2019-05-02 NOTE — Telephone Encounter (Signed)
Requesting:  hydrocodone Contract:   [facility] 11/16/2017 UDS:   Low risk 06/01/2018 Last Visit:   03/04/2019 Next Visit:   none Last Refill:   04/05/2019   #60 no refills  Please Advise

## 2019-05-19 ENCOUNTER — Ambulatory Visit (INDEPENDENT_AMBULATORY_CARE_PROVIDER_SITE_OTHER): Payer: 59 | Admitting: Family Medicine

## 2019-05-19 ENCOUNTER — Other Ambulatory Visit: Payer: Self-pay

## 2019-05-19 VITALS — BP 114/80 | HR 87 | Temp 97.5°F | Resp 18 | Ht 67.5 in | Wt 248.2 lb

## 2019-05-19 DIAGNOSIS — E785 Hyperlipidemia, unspecified: Secondary | ICD-10-CM

## 2019-05-19 DIAGNOSIS — I10 Essential (primary) hypertension: Secondary | ICD-10-CM

## 2019-05-19 DIAGNOSIS — Z01818 Encounter for other preprocedural examination: Secondary | ICD-10-CM | POA: Diagnosis not present

## 2019-05-19 LAB — POC URINALSYSI DIPSTICK (AUTOMATED)
Bilirubin, UA: NEGATIVE
Blood, UA: NEGATIVE
Glucose, UA: NEGATIVE
Ketones, UA: NEGATIVE
Leukocytes, UA: NEGATIVE
Nitrite, UA: NEGATIVE
Protein, UA: NEGATIVE
Spec Grav, UA: 1.01 (ref 1.010–1.025)
Urobilinogen, UA: 0.2 E.U./dL
pH, UA: 6 (ref 5.0–8.0)

## 2019-05-19 NOTE — Progress Notes (Signed)
Subjective:    Jonathan Hansen is a 44 y.o. male who presents to the office today for a preoperative consultation at the request of surgeon Dr Lequita Halt who plans on performing R total knee arthroplasty on February 23. This consultation is requested for the specific conditions prompting preoperative evaluation (i.e. because of potential affect on operative risk): bmi, htn. Planned anesthesia: general. The patient has the following known anesthesia issues: none. Patients bleeding risk: no recent abnormal bleeding. Patient does not have objections to receiving blood products if needed.  The following portions of the patient's history were reviewed and updated as appropriate:  He  has a past medical history of Arthritis, Back pain, Chest pain, Chronic knee pain, Depression, Dyspnea, Elevated liver enzymes, Fatty liver, HLD (hyperlipidemia), HTN (hypertension), Joint pain, Leg edema, Osteoarthritis, Pre-diabetes, and Vitamin D deficiency. He does not have any pertinent problems on file. He  has a past surgical history that includes Vasectomy and Knee arthroscopy (Right). His family history includes Coronary artery disease in his father; Diabetes in his father and paternal grandfather; Stroke in his father. He  reports that he has been smoking cigarettes and e-cigarettes. He has a 25.00 pack-year smoking history. He has never used smokeless tobacco. He reports previous alcohol use. No history on file for drug. He has a current medication list which includes the following prescription(s): bupropion and hydrocodone-acetaminophen. Current Outpatient Medications on File Prior to Visit  Medication Sig Dispense Refill  . buPROPion (WELLBUTRIN XL) 150 MG 24 hr tablet TAKE ONE TAB BY MOUTH EACH AM X7 DAYS,THEN INCREASE TO 2 TABS EACH AM 180 tablet 1  . HYDROcodone-acetaminophen (NORCO/VICODIN) 5-325 MG tablet Take 1 tablet by mouth every 6 (six) hours as needed for moderate pain. 60 tablet 0   No current  facility-administered medications on file prior to visit.   He is allergic to ibuprofen and penicillins..  Review of Systems Review of Systems  Constitutional: Negative for activity change, appetite change and fatigue.  HENT: Negative for hearing loss, congestion, tinnitus and ear discharge.  dentist q29m Eyes: Negative for visual disturbance (see optho q1y -- vision corrected to 20/20 with glasses).  Respiratory: Negative for cough, chest tightness and shortness of breath.   Cardiovascular: Negative for chest pain, palpitations and leg swelling.  Gastrointestinal: Negative for abdominal pain, diarrhea, constipation and abdominal distention.  Genitourinary: Negative for urgency, frequency, decreased urine volume and difficulty urinating.  Musculoskeletal: Negative for back pain, arthralgias and gait problem.  Skin: Negative for color change, pallor and rash.  Neurological: Negative for dizziness, light-headedness, numbness and headaches.  Hematological: Negative for adenopathy. Does not bruise/bleed easily.  Psychiatric/Behavioral: Negative for suicidal ideas, confusion, sleep disturbance, self-injury, dysphoric mood, decreased concentration and agitation.        Objective:    BP 114/80 (BP Location: Left Arm, Patient Position: Sitting, Cuff Size: Normal)   Pulse 87   Temp (!) 97.5 F (36.4 C) (Temporal)   Resp 18   Ht 5' 7.5" (1.715 m)   Wt 248 lb 3.2 oz (112.6 kg)   SpO2 97%   BMI 38.30 kg/m  General appearance: alert, cooperative, appears stated age and no distress Head: Normocephalic, without obvious abnormality, atraumatic Eyes: conjunctivae/corneas clear. PERRL, EOM's intact. Fundi benign. Ears: normal TM's and external ear canals both ears Neck: no adenopathy, no carotid bruit, no JVD, supple, symmetrical, trachea midline and thyroid not enlarged, symmetric, no tenderness/mass/nodules Back: symmetric, no curvature. ROM normal. No CVA tenderness. Lungs: clear to  auscultation bilaterally Chest wall:  no tenderness Heart: regular rate and rhythm, S1, S2 normal, no murmur, click, rub or gallop Abdomen: soft, non-tender; bowel sounds normal; no masses,  no organomegaly Extremities: b/l knee pain   Cardiographics ECG: RBBB-- not new-- no change from 10/17 Echocardiogram: normal --  02/2016  Imaging Chest x-ray: not done   Lab Review  No visits with results within 6 Month(s) from this visit.  Latest known visit with results is:  Lab on 06/02/2018  Component Date Value  . Creatinine 06/02/2018 54.8   . pH 06/02/2018 6.64   . Oxidant 06/02/2018 NEGATIVE   . Amphetamines 06/02/2018 NEGATIVE   . medMATCH Amphetamines 06/02/2018 CONSISTENT   . Benzodiazepines 06/02/2018 NEGATIVE   . medMATCH Benzodiazepines 06/02/2018 CONSISTENT   . Marijuana Metabolite 06/02/2018 NEGATIVE   . medMATCH Marijuana Metab 06/02/2018 CONSISTENT   . Cocaine Metabolite 06/02/2018 NEGATIVE   . medMATCH Cocaine Metab 06/02/2018 CONSISTENT   . Opiates 06/02/2018 NEGATIVE CONFIRMED   . Codeine 06/02/2018 NEGATIVE   . medMATCH Codeine 06/02/2018 CONSISTENT   . Hydrocodone 06/02/2018 NEGATIVE   . medMATCH Hydrocodone 06/02/2018 CONSISTENT   . Hydromorphone 06/02/2018 NEGATIVE   . medMATCH Hydromorphone 06/02/2018 CONSISTENT   . Morphine 06/02/2018 NEGATIVE   . medMATCH Morphine 06/02/2018 CONSISTENT   . Norhydrocodone 06/02/2018 NEGATIVE   . medMATCH Norhydrocodone 06/02/2018 CONSISTENT   . Oxycodone 06/02/2018 NEGATIVE   . medMATCH Oxycodone 06/02/2018 CONSISTENT   . Buprenorphine, Urine 06/02/2018 NEGATIVE   . medMATCH Buprenorphine 06/02/2018 CONSISTENT   . MDMA 06/02/2018 NEGATIVE   . medMATCH MDMA 06/02/2018 CONSISTENT   . Alcohol Metabolites 06/02/2018 NEGATIVE   . medMATCH Alcohol Metab 06/02/2018 CONSISTENT   . 6 Acetylmorphine 06/02/2018 NEGATIVE   . medMATCH 6 Acetylmorphine 06/02/2018 CONSISTENT       Assessment:      44 y.o. male with planned  surgery as above.   Known risk factors for perioperative complications: obesity, htn   Difficulty with intubation is not anticipated.  Cardiac Risk Estimation: low  Current medications which may produce withdrawal symptoms if withheld perioperatively: na     Plan:    1. Preoperative workup as follows ECG, hemoglobin, hematocrit, electrolytes, creatinine, glucose, liver function studies, coagulation studies. 2. Change in medication regimen before surgery: per surgical team. 3 . Deep vein thrombosis prophylaxis postoperatively:regimen to be chosen by surgical team.

## 2019-05-20 ENCOUNTER — Encounter: Payer: Self-pay | Admitting: Family Medicine

## 2019-05-20 LAB — CBC WITH DIFFERENTIAL/PLATELET
Basophils Absolute: 0 10*3/uL (ref 0.0–0.1)
Basophils Relative: 0.8 % (ref 0.0–3.0)
Eosinophils Absolute: 0.1 10*3/uL (ref 0.0–0.7)
Eosinophils Relative: 1.7 % (ref 0.0–5.0)
HCT: 44.4 % (ref 39.0–52.0)
Hemoglobin: 15.1 g/dL (ref 13.0–17.0)
Lymphocytes Relative: 27.9 % (ref 12.0–46.0)
Lymphs Abs: 1.8 10*3/uL (ref 0.7–4.0)
MCHC: 34.1 g/dL (ref 30.0–36.0)
MCV: 89.5 fl (ref 78.0–100.0)
Monocytes Absolute: 0.9 10*3/uL (ref 0.1–1.0)
Monocytes Relative: 13.8 % — ABNORMAL HIGH (ref 3.0–12.0)
Neutro Abs: 3.5 10*3/uL (ref 1.4–7.7)
Neutrophils Relative %: 55.8 % (ref 43.0–77.0)
Platelets: 226 10*3/uL (ref 150.0–400.0)
RBC: 4.97 Mil/uL (ref 4.22–5.81)
RDW: 12.8 % (ref 11.5–15.5)
WBC: 6.4 10*3/uL (ref 4.0–10.5)

## 2019-05-20 LAB — PROTIME-INR
INR: 1.2 ratio — ABNORMAL HIGH (ref 0.8–1.0)
Prothrombin Time: 13.7 s — ABNORMAL HIGH (ref 9.6–13.1)

## 2019-05-20 LAB — LIPID PANEL
Cholesterol: 132 mg/dL (ref 0–200)
HDL: 41.8 mg/dL (ref >39.00)
LDL Cholesterol: 58 mg/dL (ref 0–99)
NonHDL: 90.11
Total CHOL/HDL Ratio: 3
Triglycerides: 161 mg/dL — ABNORMAL HIGH (ref 0.0–149.0)
VLDL: 32.2 mg/dL (ref 0.0–40.0)

## 2019-05-20 LAB — HEMOGLOBIN A1C: Hgb A1c MFr Bld: 5.4 % (ref 4.6–6.5)

## 2019-05-20 LAB — APTT: aPTT: 30.4 s (ref 23.4–32.7)

## 2019-05-24 ENCOUNTER — Telehealth: Payer: Self-pay | Admitting: Family Medicine

## 2019-05-24 DIAGNOSIS — Z01818 Encounter for other preprocedural examination: Secondary | ICD-10-CM

## 2019-05-24 NOTE — Telephone Encounter (Signed)
Labs ordered. Lab appointment made

## 2019-05-24 NOTE — Telephone Encounter (Signed)
Patient needs CMP, Comprehensive Metabolic Panel ordered so that he can have his surgery.

## 2019-05-25 NOTE — Addendum Note (Signed)
Addended by: Mervin Kung A on: 05/25/2019 10:57 AM   Modules accepted: Orders

## 2019-05-26 ENCOUNTER — Telehealth: Payer: Self-pay | Admitting: Family Medicine

## 2019-05-26 NOTE — Telephone Encounter (Signed)
Pt returned call, was informed to Pick up form from [facility] or print it form web side  Pt stated will print one from web side today

## 2019-05-26 NOTE — Telephone Encounter (Signed)
Called and left a detailed message to advise pt that handicap placard papers can be picked up at the [facility] or should be able to be printed from online. If he is having surgery the surgeons office should be able to help in completing this.

## 2019-05-26 NOTE — Telephone Encounter (Signed)
Patient states that he needs wheelchair sign for his car, needs to know how to get that sticker. Patient states he is having a knee surgery soon.   Patient is requesting a call back

## 2019-05-27 ENCOUNTER — Other Ambulatory Visit (INDEPENDENT_AMBULATORY_CARE_PROVIDER_SITE_OTHER): Payer: 59

## 2019-05-27 ENCOUNTER — Other Ambulatory Visit: Payer: Self-pay | Admitting: Family Medicine

## 2019-05-27 ENCOUNTER — Other Ambulatory Visit: Payer: 59

## 2019-05-27 DIAGNOSIS — M25562 Pain in left knee: Secondary | ICD-10-CM

## 2019-05-27 DIAGNOSIS — Z01818 Encounter for other preprocedural examination: Secondary | ICD-10-CM

## 2019-05-27 DIAGNOSIS — G8929 Other chronic pain: Secondary | ICD-10-CM

## 2019-05-27 LAB — COMPREHENSIVE METABOLIC PANEL
ALT: 29 U/L (ref 0–53)
AST: 20 U/L (ref 0–37)
Albumin: 4.4 g/dL (ref 3.5–5.2)
Alkaline Phosphatase: 42 U/L (ref 39–117)
BUN: 16 mg/dL (ref 6–23)
CO2: 27 mEq/L (ref 19–32)
Calcium: 9.6 mg/dL (ref 8.4–10.5)
Chloride: 101 mEq/L (ref 96–112)
Creatinine, Ser: 0.94 mg/dL (ref 0.40–1.50)
GFR: 86.38 mL/min (ref >60.00)
Glucose, Bld: 98 mg/dL (ref 70–99)
Potassium: 3.8 mEq/L (ref 3.5–5.1)
Sodium: 136 mEq/L (ref 135–145)
Total Bilirubin: 0.6 mg/dL (ref 0.2–1.2)
Total Protein: 7.7 g/dL (ref 6.0–8.3)

## 2019-05-27 MED ORDER — HYDROCODONE-ACETAMINOPHEN 5-325 MG PO TABS: 1.0000 | ORAL_TABLET | Freq: Four times a day (QID) | ORAL | 0 refills | Status: DC | PRN

## 2019-05-27 NOTE — Telephone Encounter (Signed)
Requesting:Norco Contract:none UDS:06/02/2018 Last Visit:05/19/2019 Next Visit:none Last Refill:05/02/2019  Please Advise

## 2019-06-17 ENCOUNTER — Other Ambulatory Visit: Payer: Self-pay | Admitting: Family Medicine

## 2019-07-06 ENCOUNTER — Ambulatory Visit (INDEPENDENT_AMBULATORY_CARE_PROVIDER_SITE_OTHER): Payer: 59 | Admitting: Family Medicine

## 2019-07-06 ENCOUNTER — Other Ambulatory Visit: Payer: Self-pay

## 2019-07-06 ENCOUNTER — Encounter: Payer: Self-pay | Admitting: Family Medicine

## 2019-07-06 VITALS — Wt 240.0 lb

## 2019-07-06 DIAGNOSIS — M170 Bilateral primary osteoarthritis of knee: Secondary | ICD-10-CM

## 2019-07-06 NOTE — Assessment & Plan Note (Signed)
S/p R TKR Pt has been on hydrocodone for years---- [facility] We have gotten him down to 60 a month with the hope of getting him off of it completely after both knees are replaced Pt has appointment with ortho Friday to f/u surgery and discuss pain meds

## 2019-07-06 NOTE — Progress Notes (Signed)
Virtual Visit via Video Note  I connected with Mittie Bodo on 07/06/19 at  9:40 AM EDT by a video enabled telemedicine application and verified that I am speaking with the correct person using two identifiers.  Location: Patient: home with wife  Provider: home    I discussed the limitations of evaluation and management by telemedicine and the availability of in person appointments. The patient expressed understanding and agreed to proceed.  History of Present Illness: Pt is home and wants to discuss his knee pain.  He had TKR and has had a lot of pain and swelling since.  His other knee is hurting a lot and he has been using pain meds regularly   Ortho wants him to wean off the opioids   Observations/Objective There were no vitals filed for this visit.  Pt is in nad R knee-- wound healing well  Knee is swollen    Assessment and Plan: 1. Primary osteoarthritis of both knees S/p R TKR Pt has been on hydrocodone for years---- [facility] We have gotten him down to 60 a month with the hope of getting him off of it completely after both knees are replaced Pt has appointment with ortho Friday to f/u surgery and discuss pain meds    Follow Up Instructions:    I discussed the assessment and treatment plan with the patient. The patient was provided an opportunity to ask questions and all were answered. The patient agreed with the plan and demonstrated an understanding of the instructions.   The patient was advised to call back or seek an in-person evaluation if the symptoms worsen or if the condition fails to improve as anticipated.  I provided 30 minutes of non-face-to-face time during this encounter.   Donato Schultz, DO

## 2019-10-27 ENCOUNTER — Encounter: Payer: Self-pay | Admitting: Family Medicine

## 2019-11-07 ENCOUNTER — Ambulatory Visit (INDEPENDENT_AMBULATORY_CARE_PROVIDER_SITE_OTHER): Payer: 59 | Admitting: Family Medicine

## 2019-11-07 ENCOUNTER — Encounter: Payer: Self-pay | Admitting: Family Medicine

## 2019-11-07 ENCOUNTER — Other Ambulatory Visit: Payer: Self-pay

## 2019-11-07 VITALS — BP 130/80 | HR 90 | Temp 98.2°F | Resp 18 | Ht 67.5 in | Wt 273.2 lb

## 2019-11-07 DIAGNOSIS — R300 Dysuria: Secondary | ICD-10-CM | POA: Diagnosis not present

## 2019-11-07 DIAGNOSIS — G8929 Other chronic pain: Secondary | ICD-10-CM

## 2019-11-07 DIAGNOSIS — Z79899 Other long term (current) drug therapy: Secondary | ICD-10-CM

## 2019-11-07 DIAGNOSIS — M25561 Pain in right knee: Secondary | ICD-10-CM | POA: Diagnosis not present

## 2019-11-07 DIAGNOSIS — R350 Frequency of micturition: Secondary | ICD-10-CM

## 2019-11-07 DIAGNOSIS — M25562 Pain in left knee: Secondary | ICD-10-CM

## 2019-11-07 DIAGNOSIS — M170 Bilateral primary osteoarthritis of knee: Secondary | ICD-10-CM

## 2019-11-07 LAB — POC URINALSYSI DIPSTICK (AUTOMATED)
Bilirubin, UA: NEGATIVE
Blood, UA: NEGATIVE
Glucose, UA: NEGATIVE
Ketones, UA: NEGATIVE
Leukocytes, UA: NEGATIVE
Nitrite, UA: NEGATIVE
Protein, UA: NEGATIVE
Spec Grav, UA: 1.015 (ref 1.010–1.025)
Urobilinogen, UA: 0.2 E.U./dL
pH, UA: 7 (ref 5.0–8.0)

## 2019-11-07 MED ORDER — HYDROCODONE-ACETAMINOPHEN 5-325 MG PO TABS: 1.0000 | ORAL_TABLET | Freq: Four times a day (QID) | ORAL | 0 refills | Status: DC | PRN

## 2019-11-07 MED ORDER — TAMSULOSIN HCL 0.4 MG PO CAPS: 0.4000 mg | ORAL_CAPSULE | Freq: Every day | ORAL | 3 refills | Status: DC

## 2019-11-07 MED ORDER — CIPROFLOXACIN HCL 500 MG PO TABS: 500.0000 mg | ORAL_TABLET | Freq: Two times a day (BID) | ORAL | 0 refills | Status: DC

## 2019-11-07 NOTE — Progress Notes (Signed)
Patient ID: Jonathan Hansen, male    DOB: 09/28/1975  Age: 44 y.o. MRN: [id number]    Subjective:  Subjective  HPI Jonathan Hansen presents for f/u uti after surgery---- from catheter -- he was given cipro , pyridium by surgeons office --- no relief     Burning con't  ,  No blood, pt denies frequency  He also needs a refill on pain meds  Review of Systems  Constitutional: Negative for appetite change, diaphoresis, fatigue and unexpected weight change.  Eyes: Negative for pain, redness and visual disturbance.  Respiratory: Negative for cough, chest tightness, shortness of breath and wheezing.   Cardiovascular: Negative for chest pain, palpitations and leg swelling.  Endocrine: Negative for cold intolerance, heat intolerance, polydipsia, polyphagia and polyuria.  Genitourinary: Negative for difficulty urinating, dysuria and frequency.  Musculoskeletal: Positive for arthralgias and gait problem.  Neurological: Negative for dizziness, light-headedness, numbness and headaches.    History Past Medical History:  Diagnosis Date  . Arthritis   . Back pain   . Chest pain   . Chronic knee pain   . Depression   . Dyspnea   . Elevated liver enzymes   . Fatty liver   . HLD (hyperlipidemia)   . HTN (hypertension)   . Joint pain   . Leg edema   . Osteoarthritis   . Pre-diabetes   . Vitamin D deficiency     He has a past surgical history that includes Vasectomy and Knee arthroscopy (Right).   His family history includes Coronary artery disease in his father; Diabetes in his father and paternal grandfather; Stroke in his father.He reports that he has been smoking cigarettes and e-cigarettes. He has a 25.00 pack-year smoking history. He has never used smokeless tobacco. He reports previous alcohol use. No history on file for drug use.  Current Outpatient Medications on File Prior to Visit  Medication Sig Dispense Refill  . buPROPion (WELLBUTRIN XL) 150 MG 24 hr tablet TAKE ONE TAB BY MOUTH  EACH AM X7 DAYS,THEN INCREASE TO 2 TABS EACH AM 180 tablet 1  . gabapentin (NEURONTIN) 300 MG capsule gabapentin 300 mg capsule     No current facility-administered medications on file prior to visit.     Objective:  Objective  Physical Exam Vitals and nursing note reviewed.  Constitutional:      General: He is sleeping.     Appearance: He is well-developed.  HENT:     Head: Normocephalic and atraumatic.  Eyes:     Pupils: Pupils are equal, round, and reactive to light.  Neck:     Thyroid: No thyromegaly.  Cardiovascular:     Rate and Rhythm: Normal rate and regular rhythm.     Heart sounds: No murmur heard.   Pulmonary:     Effort: Pulmonary effort is normal. No respiratory distress.     Breath sounds: Normal breath sounds. No wheezing or rales.  Chest:     Chest wall: No tenderness.  Abdominal:     General: There is no distension.     Tenderness: There is no abdominal tenderness. There is no guarding.  Musculoskeletal:        General: Swelling and tenderness present.     Cervical back: Normal range of motion and neck supple.  Skin:    General: Skin is warm and dry.  Neurological:     Mental Status: He is oriented to person, place, and time.  Psychiatric:        Behavior: Behavior normal.  Thought Content: Thought content normal.        Judgment: Judgment normal.    BP 130/80 (BP Location: Right Arm, Patient Position: Sitting, Cuff Size: Large)   Pulse 90   Temp 98.2 F (36.8 C) (Oral)   Resp 18   Ht 5' 7.5" (1.715 m)   Wt 273 lb 3.2 oz (123.9 kg)   SpO2 98%   BMI 42.16 kg/m  Wt Readings from Last 3 Encounters:  11/07/19 273 lb 3.2 oz (123.9 kg)  07/06/19 240 lb (108.9 kg)  05/19/19 248 lb 3.2 oz (112.6 kg)     Lab Results  Component Value Date   WBC 6.4 05/19/2019   HGB 15.1 05/19/2019   HCT 44.4 05/19/2019   PLT 226.0 05/19/2019   GLUCOSE 98 05/27/2019   CHOL 132 05/19/2019   TRIG 161.0 (H) 05/19/2019   HDL 41.80 05/19/2019   LDLDIRECT  46.0 05/19/2017   LDLCALC 58 05/19/2019   ALT 29 05/27/2019   AST 20 05/27/2019   NA 136 05/27/2019   K 3.8 05/27/2019   CL 101 05/27/2019   CREATININE 0.94 05/27/2019   BUN 16 05/27/2019   CO2 27 05/27/2019   TSH 1.570 08/13/2017   INR 1.2 (H) 05/19/2019   HGBA1C 5.4 05/19/2019    ECHOCARDIOGRAM COMPLETE  Result Date: 03/05/2016                       [facility]*                       [address]                        [place], [place] [address]                            [phone] ------------------------------------------------------------------- Transthoracic Echocardiography Patient:    Jonathan, Hansen MR #:       [id number] Study Date: 03/05/2016 Gender:     M Age:        40 Height:     172.7 cm Weight:     123.8 kg BSA:        2.5 m^2 Pt. Status: Room:  SONOGRAPHER  Nolon Rod, RDCS  PERFORMING   [facility], Outpatient  ATTENDING    Zola Button, Grayling Congress  ORDERING     Zola Button, Myrene Buddy R  REFERRING    [place] R cc: ------------------------------------------------------------------- LV EF: 50% -   55% ------------------------------------------------------------------- Indications:      Chest pain 786.51. ------------------------------------------------------------------- History:   Risk factors:  Current tobacco use. Hypertension. Obese.  ------------------------------------------------------------------- Study Conclusions - Left ventricle: The cavity size was normal. There was mild   concentric hypertrophy. Systolic function was normal. The   estimated ejection fraction was in the range of 50% to 55%. Wall   motion was normal; there were no regional wall motion   abnormalities. Left ventricular diastolic function parameters   were normal. GLS: -18.8. - Right ventricle: The cavity size was mildly dilated. Wall   thickness was normal. ------------------------------------------------------------------- Study data:  No prior study was available for comparison.  Study  status:  Routine.  Procedure:  The patient&'s pain level was 2 on a scale of 1 to 10.  Study completion:  There were no complications.         Transthoracic echocardiography.  M-mode, complete  2D, spectral Doppler, and color Doppler.  Birthdate:  Patient birthdate: 12/16/1975.  Age:  Patient is 44 yr old.  Sex:  Gender: male.    BMI: 41.5 kg/m^2.  Blood pressure:     120/82  Patient status:  Outpatient.  Study date:  Study date: 03/05/2016. Study time: 10:13 AM.  Location:  Bedside. ------------------------------------------------------------------- ------------------------------------------------------------------- Left ventricle:  The cavity size was normal. There was mild concentric hypertrophy. Systolic function was normal. The estimated ejection fraction was in the range of 50% to 55%. Wall motion was normal; there were no regional wall motion abnormalities. GLS: -18.8. Left ventricular diastolic function parameters were normal.  ------------------------------------------------------------------- Aortic valve:   Trileaflet; normal thickness leaflets. Mobility was not restricted.  Doppler:  Transvalvular velocity was within the normal range. There was no stenosis. There was no regurgitation.  ------------------------------------------------------------------- Aorta:  Aortic root: The aortic root was normal in size. ------------------------------------------------------------------- Mitral valve:   Structurally normal valve.   Mobility was not restricted.  Doppler:  Transvalvular velocity was within the normal range. There was no evidence for stenosis. There was no regurgitation.    Peak gradient (D): 2 mm Hg. ------------------------------------------------------------------- Left atrium:  The atrium was normal in size. ------------------------------------------------------------------- Right ventricle:  The cavity size was mildly dilated. Wall thickness was normal. Systolic function was normal.  ------------------------------------------------------------------- Pulmonic valve:    Structurally normal valve.   Cusp separation was normal.  Doppler:  Transvalvular velocity was within the normal range. There was no evidence for stenosis. There was trivial regurgitation. ------------------------------------------------------------------- Tricuspid valve:   Structurally normal valve.    Doppler: Transvalvular velocity was within the normal range. There was no regurgitation. ------------------------------------------------------------------- Pulmonary artery:   The main pulmonary artery was normal-sized. Systolic pressure was within the normal range. ------------------------------------------------------------------- Right atrium:  The atrium was normal in size. ------------------------------------------------------------------- Pericardium:  There was no pericardial effusion. ------------------------------------------------------------------- Systemic veins: Inferior vena cava: The vessel was normal in size. ------------------------------------------------------------------- Measurements  Left ventricle                          Value        Reference  LV ID, ED, PLAX chordal                 48.9  mm     43 - 52  LV ID, ES, PLAX chordal                 31.7  mm     23 - 38  LV fx shortening, PLAX chordal          35    %      >=29  LV PW thickness, ED                     11.6  mm     ---------  IVS/LV PW ratio, ED                     1.09         <=1.3  Stroke volume, 2D                       97    ml     ---------  Stroke volume/bsa, 2D                   39    ml/m^2 ---------  LV ejection fraction, 1-p A4C  57    %      ---------  LV end-diastolic volume, 2-p            88    ml     ---------  LV end-systolic volume, 2-p             39    ml     ---------  LV ejection fraction, 2-p               56    %      ---------  Stroke volume, 2-p                      49    ml     ---------  LV end-diastolic  volume/bsa, 2-p        35    ml/m^2 ---------  LV end-systolic volume/bsa, 2-p         15    ml/m^2 ---------  Stroke volume/bsa, 2-p                  19.7  ml/m^2 ---------  LV e&', lateral                          10.4  cm/s   ---------  LV E/e&', lateral                        7.46         ---------  LV e&', medial                           8.05  cm/s   ---------  LV E/e&', medial                         9.64         ---------  LV e&', average                          9.23  cm/s   ---------  LV E/e&', average                        8.41         ---------  Longitudinal strain, TDI                19    %      ---------   Ventricular septum                      Value        Reference  IVS thickness, ED                       12.6  mm     ---------   LVOT                                    Value        Reference  LVOT ID, S                              24    mm     ---------  LVOT  area                               4.52  cm^2   ---------  LVOT peak velocity, S                   104   cm/s   ---------  LVOT mean velocity, S                   72.4  cm/s   ---------  LVOT VTI, S                             21.4  cm     ---------   Aorta                                   Value        Reference  Aortic root ID, ED                      37    mm     ---------   Left atrium                             Value        Reference  LA ID, A-P, ES                          38    mm     ---------  LA ID/bsa, A-P                          1.52  cm/m^2 <=2.2  LA volume, S                            49.6  ml     ---------  LA volume/bsa, S                        19.9  ml/m^2 ---------  LA volume, ES, 1-p A4C                  44.2  ml     ---------  LA volume/bsa, ES, 1-p A4C              17.7  ml/m^2 ---------  LA volume, ES, 1-p A2C                  52.5  ml     ---------  LA volume/bsa, ES, 1-p A2C              21    ml/m^2 ---------   Mitral valve                            Value        Reference  Mitral E-wave peak velocity              77.6  cm/s   ---------  Mitral A-wave peak velocity             53.4  cm/s   ---------  Mitral deceleration time         (H)    261   ms     150 - 230  Mitral peak gradient, D                 2     mm Hg  ---------  Mitral E/A ratio, peak                  1.5          ---------   Pulmonary arteries                      Value        Reference  PA pressure, S, DP                      21    mm Hg  <=30   Tricuspid valve                         Value        Reference  Tricuspid regurg peak velocity          214   cm/s   ---------  Tricuspid peak RV-RA gradient           18    mm Hg  ---------   Systemic veins                          Value        Reference  Estimated CVP                           3     mm Hg  ---------   Right ventricle                         Value        Reference  RV pressure, S, DP                      21    mm Hg  <=30  RV s&', lateral, S                       13.9  cm/s   ---------   Pulmonic valve                          Value        Reference  Pulmonic regurg velocity, ED            88.2  cm/s   --------- Legend: (L)  and  (H)  mark values outside specified reference range. ------------------------------------------------------------------- Prepared and Electronically Authenticated by Donato Schultz, M.D. 2017-11-15T11:03:21    Assessment & Plan:  Plan  I have discontinued Jonathan Hansen "Bill"'s oxyCODONE. I am also having him start on ciprofloxacin and tamsulosin. Additionally, I am having him maintain his buPROPion, gabapentin, HYDROcodone-acetaminophen, and HYDROcodone-acetaminophen.  Meds ordered this encounter  Medications  . ciprofloxacin (CIPRO) 500 MG tablet    Sig: Take 1 tablet (500 mg total) by mouth 2 (two) times daily.    Dispense:  28 tablet    Refill:  0  . tamsulosin (FLOMAX) 0.4 MG CAPS capsule    Sig: Take 1 capsule (0.4 mg total)  by mouth daily.    Dispense:  30 capsule    Refill:  3  . HYDROcodone-acetaminophen (NORCO/VICODIN) 5-325 MG tablet    Sig: Take 1  tablet by mouth every 6 (six) hours as needed for moderate pain.    Dispense:  45 tablet    Refill:  0  . HYDROcodone-acetaminophen (NORCO/VICODIN) 5-325 MG tablet    Sig: Take 1 tablet by mouth every 6 (six) hours as needed for moderate pain.    Dispense:  45 tablet    Refill:  0  +  Problem List Items Addressed This Visit      Unprioritized   Chronic pain of both knees   Relevant Medications   HYDROcodone-acetaminophen (NORCO/VICODIN) 5-325 MG tablet   HYDROcodone-acetaminophen (NORCO/VICODIN) 5-325 MG tablet   Other Relevant Orders   DRUG MONITORING, PANEL 8 WITH CONFIRMATION, URINE   Primary osteoarthritis of both knees    Will refill pain med contract and uds updated Refill pain meds Pt will wean off off these slowly over the next few months  Last surgery was just a month ago      Relevant Medications   HYDROcodone-acetaminophen (NORCO/VICODIN) 5-325 MG tablet   HYDROcodone-acetaminophen (NORCO/VICODIN) 5-325 MG tablet    Other Visit Diagnoses    Dysuria    -  Primary   Relevant Medications   ciprofloxacin (CIPRO) 500 MG tablet   tamsulosin (FLOMAX) 0.4 MG CAPS capsule   Other Relevant Orders   POCT Urinalysis Dipstick (Automated) (Completed)   Urine Culture   Ambulatory referral to Urology   Frequency of urination       Relevant Medications   ciprofloxacin (CIPRO) 500 MG tablet   tamsulosin (FLOMAX) 0.4 MG CAPS capsule   Other Relevant Orders   PSA   High risk medication use       Relevant Orders   DRUG MONITORING, PANEL 8 WITH CONFIRMATION, URINE      Follow-up: Return in about 3 months (around 02/07/2020), or if symptoms worsen or fail to improve.  Donato Schultz, DO

## 2019-11-07 NOTE — Patient Instructions (Signed)

## 2019-11-08 DIAGNOSIS — R300 Dysuria: Secondary | ICD-10-CM | POA: Insufficient documentation

## 2019-11-08 LAB — URINE CULTURE
MICRO NUMBER:: 10721298
Result:: NO GROWTH
SPECIMEN QUALITY:: ADEQUATE

## 2019-11-08 NOTE — Assessment & Plan Note (Signed)
Will refill pain med contract and uds updated Refill pain meds Pt will wean off off these slowly over the next few months  Last surgery was just a month ago

## 2019-11-08 NOTE — Assessment & Plan Note (Signed)
Refill cipro  Check culture--- UA normal  Will try flomax as well If symptoms do not improve-- will refer to urology

## 2019-11-10 LAB — DRUG MONITORING, PANEL 8 WITH CONFIRMATION, URINE
6 Acetylmorphine: NEGATIVE ng/mL (ref <10)
Alcohol Metabolites: NEGATIVE ng/mL
Amphetamines: NEGATIVE ng/mL (ref <500)
Benzodiazepines: NEGATIVE ng/mL (ref <100)
Buprenorphine, Urine: NEGATIVE ng/mL (ref <5)
Cocaine Metabolite: NEGATIVE ng/mL (ref <150)
Codeine: NEGATIVE ng/mL (ref <50)
Creatinine: 72.4 mg/dL
Hydrocodone: NEGATIVE ng/mL (ref <50)
Hydromorphone: NEGATIVE ng/mL (ref <50)
MDMA: NEGATIVE ng/mL (ref <500)
Marijuana Metabolite: NEGATIVE ng/mL (ref <20)
Morphine: NEGATIVE ng/mL (ref <50)
Norhydrocodone: NEGATIVE ng/mL (ref <50)
Noroxycodone: 499 ng/mL — ABNORMAL HIGH (ref <50)
Opiates: NEGATIVE ng/mL (ref <100)
Oxidant: NEGATIVE ug/mL
Oxycodone: 66 ng/mL — ABNORMAL HIGH (ref <50)
Oxycodone: POSITIVE ng/mL — AB (ref <100)
Oxymorphone: 54 ng/mL — ABNORMAL HIGH (ref <50)
pH: 7.1 (ref 4.5–9.0)

## 2019-11-10 LAB — DM TEMPLATE

## 2019-12-27 ENCOUNTER — Encounter: Payer: Self-pay | Admitting: Family Medicine

## 2019-12-27 DIAGNOSIS — M25561 Pain in right knee: Secondary | ICD-10-CM

## 2019-12-27 DIAGNOSIS — G8929 Other chronic pain: Secondary | ICD-10-CM

## 2019-12-27 MED ORDER — HYDROCODONE-ACETAMINOPHEN 5-325 MG PO TABS: 1.0000 | ORAL_TABLET | Freq: Four times a day (QID) | ORAL | 0 refills | Status: DC | PRN

## 2020-02-07 ENCOUNTER — Ambulatory Visit: Payer: 59 | Admitting: Family Medicine

## 2020-02-10 ENCOUNTER — Ambulatory Visit (INDEPENDENT_AMBULATORY_CARE_PROVIDER_SITE_OTHER): Payer: 59 | Admitting: Family Medicine

## 2020-02-10 ENCOUNTER — Other Ambulatory Visit: Payer: Self-pay

## 2020-02-10 ENCOUNTER — Encounter: Payer: Self-pay | Admitting: Family Medicine

## 2020-02-10 DIAGNOSIS — M25561 Pain in right knee: Secondary | ICD-10-CM | POA: Diagnosis not present

## 2020-02-10 DIAGNOSIS — M25562 Pain in left knee: Secondary | ICD-10-CM

## 2020-02-10 DIAGNOSIS — G8929 Other chronic pain: Secondary | ICD-10-CM

## 2020-02-10 MED ORDER — HYDROCODONE-ACETAMINOPHEN 5-325 MG PO TABS: 1.0000 | ORAL_TABLET | Freq: Four times a day (QID) | ORAL | 0 refills | Status: DC | PRN

## 2020-02-10 NOTE — Progress Notes (Signed)
Patient ID: Jonathan Hansen, male    DOB: [birth year]  Age: 44 y.o. MRN: [id number]    Subjective:  Subjective  HPI Jonathan Hansen presents for f/u chronic pain meds--- s/p b/l knee replacements--- he has cut down on pain meds ' No complaints    Review of Systems  Constitutional: Negative for appetite change, diaphoresis, fatigue and unexpected weight change.  Eyes: Negative for pain, redness and visual disturbance.  Respiratory: Negative for cough, chest tightness, shortness of breath and wheezing.   Cardiovascular: Negative for chest pain, palpitations and leg swelling.  Endocrine: Negative for cold intolerance, heat intolerance, polydipsia, polyphagia and polyuria.  Genitourinary: Negative for difficulty urinating, dysuria and frequency.  Musculoskeletal: Positive for arthralgias.  Neurological: Negative for dizziness, light-headedness, numbness and headaches.    History Past Medical History:  Diagnosis Date  . Arthritis   . Back pain   . Chest pain   . Chronic knee pain   . Depression   . Dyspnea   . Elevated liver enzymes   . Fatty liver   . HLD (hyperlipidemia)   . HTN (hypertension)   . Joint pain   . Leg edema   . Osteoarthritis   . Pre-diabetes   . Vitamin D deficiency     He has a past surgical history that includes Vasectomy and Knee arthroscopy (Right).   His family history includes Coronary artery disease in his father; Diabetes in his father and paternal grandfather; Stroke in his father.He reports that he has been smoking cigarettes and e-cigarettes. He has a 25.00 pack-year smoking history. He has never used smokeless tobacco. He reports previous alcohol use. No history on file for drug use.  Current Outpatient Medications on File Prior to Visit  Medication Sig Dispense Refill  . buPROPion (WELLBUTRIN XL) 150 MG 24 hr tablet TAKE ONE TAB BY MOUTH EACH AM X7 DAYS,THEN INCREASE TO 2 TABS EACH AM (Patient not taking: Reported on 02/10/2020) 180 tablet 1    No current facility-administered medications on file prior to visit.     Objective:  Objective  Physical Exam Vitals and nursing note reviewed.  Constitutional:      General: He is sleeping.     Appearance: He is well-developed.  HENT:     Head: Normocephalic and atraumatic.  Eyes:     Pupils: Pupils are equal, round, and reactive to light.  Neck:     Thyroid: No thyromegaly.  Cardiovascular:     Rate and Rhythm: Normal rate and regular rhythm.     Heart sounds: No murmur heard.   Pulmonary:     Effort: Pulmonary effort is normal. No respiratory distress.     Breath sounds: Normal breath sounds. No wheezing or rales.  Chest:     Chest wall: No tenderness.  Musculoskeletal:        General: Tenderness present. No swelling.     Cervical back: Normal range of motion and neck supple.     Right lower leg: No edema.     Left lower leg: No edema.  Skin:    General: Skin is warm and dry.  Neurological:     Mental Status: He is oriented to person, place, and time.  Psychiatric:        Behavior: Behavior normal.        Thought Content: Thought content normal.        Judgment: Judgment normal.    BP 120/84 (BP Location: Right Arm, Patient Position: Sitting, Cuff Size: Large)   Pulse  75   Temp 99 F (37.2 C) (Oral)   Resp 18   Ht 5' 7.5" (1.715 m)   Wt 276 lb 12.8 oz (125.6 kg)   SpO2 98%   BMI 42.71 kg/m  Wt Readings from Last 3 Encounters:  02/10/20 276 lb 12.8 oz (125.6 kg)  11/07/19 273 lb 3.2 oz (123.9 kg)  07/06/19 240 lb (108.9 kg)     Lab Results  Component Value Date   WBC 6.4 05/19/2019   HGB 15.1 05/19/2019   HCT 44.4 05/19/2019   PLT 226.0 05/19/2019   GLUCOSE 98 05/27/2019   CHOL 132 05/19/2019   TRIG 161.0 (H) 05/19/2019   HDL 41.80 05/19/2019   LDLDIRECT 46.0 05/19/2017   LDLCALC 58 05/19/2019   ALT 29 05/27/2019   AST 20 05/27/2019   NA 136 05/27/2019   K 3.8 05/27/2019   CL 101 05/27/2019   CREATININE 0.94 05/27/2019   BUN 16  05/27/2019   CO2 27 05/27/2019   TSH 1.570 08/13/2017   INR 1.2 (H) 05/19/2019   HGBA1C 5.4 05/19/2019    ECHOCARDIOGRAM COMPLETE  Result Date: 03/05/2016                       [facility]*                       [address]                        [place], [place] [address]                            [phone] ------------------------------------------------------------------- Transthoracic Echocardiography Patient:    Jonathan Hansen MR #:       [id number] Study Date: 03/05/2016 Gender:     M Age:        40 Height:     172.7 cm Weight:     123.8 kg BSA:        2.5 m^2 Pt. Status: Room:  SONOGRAPHER  Nolon Rod, RDCS  PERFORMING   [facility], Outpatient  ATTENDING    Zola Button, Grayling Congress  ORDERING     Zola Button, Myrene Buddy R  REFERRING    [place] R cc: ------------------------------------------------------------------- LV EF: 50% -   55% ------------------------------------------------------------------- Indications:      Chest pain 786.51. ------------------------------------------------------------------- History:   Risk factors:  Current tobacco use. Hypertension. Obese.  ------------------------------------------------------------------- Study Conclusions - Left ventricle: The cavity size was normal. There was mild   concentric hypertrophy. Systolic function was normal. The   estimated ejection fraction was in the range of 50% to 55%. Wall   motion was normal; there were no regional wall motion   abnormalities. Left ventricular diastolic function parameters   were normal. GLS: -18.8. - Right ventricle: The cavity size was mildly dilated. Wall   thickness was normal. ------------------------------------------------------------------- Study data:  No prior study was available for comparison.  Study status:  Routine.  Procedure:  The patient&'s pain level was 2 on a scale of 1 to 10.  Study completion:  There were no complications.         Transthoracic echocardiography.  M-mode,  complete 2D, spectral Doppler, and color Doppler.  Birthdate:  Patient birthdate: [birth year].  Age:  Patient is 43 yr old.  Sex:  Gender: male.    BMI: 41.5 kg/m^2.  Blood  pressure:     120/82  Patient status:  Outpatient.  Study date:  Study date: 03/05/2016. Study time: 10:13 AM.  Location:  Bedside. ------------------------------------------------------------------- ------------------------------------------------------------------- Left ventricle:  The cavity size was normal. There was mild concentric hypertrophy. Systolic function was normal. The estimated ejection fraction was in the range of 50% to 55%. Wall motion was normal; there were no regional wall motion abnormalities. GLS: -18.8. Left ventricular diastolic function parameters were normal.  ------------------------------------------------------------------- Aortic valve:   Trileaflet; normal thickness leaflets. Mobility was not restricted.  Doppler:  Transvalvular velocity was within the normal range. There was no stenosis. There was no regurgitation.  ------------------------------------------------------------------- Aorta:  Aortic root: The aortic root was normal in size. ------------------------------------------------------------------- Mitral valve:   Structurally normal valve.   Mobility was not restricted.  Doppler:  Transvalvular velocity was within the normal range. There was no evidence for stenosis. There was no regurgitation.    Peak gradient (D): 2 mm Hg. ------------------------------------------------------------------- Left atrium:  The atrium was normal in size. ------------------------------------------------------------------- Right ventricle:  The cavity size was mildly dilated. Wall thickness was normal. Systolic function was normal. ------------------------------------------------------------------- Pulmonic valve:    Structurally normal valve.   Cusp separation was normal.  Doppler:  Transvalvular velocity was within the normal  range. There was no evidence for stenosis. There was trivial regurgitation. ------------------------------------------------------------------- Tricuspid valve:   Structurally normal valve.    Doppler: Transvalvular velocity was within the normal range. There was no regurgitation. ------------------------------------------------------------------- Pulmonary artery:   The main pulmonary artery was normal-sized. Systolic pressure was within the normal range. ------------------------------------------------------------------- Right atrium:  The atrium was normal in size. ------------------------------------------------------------------- Pericardium:  There was no pericardial effusion. ------------------------------------------------------------------- Systemic veins: Inferior vena cava: The vessel was normal in size. ------------------------------------------------------------------- Measurements  Left ventricle                          Value        Reference  LV ID, ED, PLAX chordal                 48.9  mm     43 - 52  LV ID, ES, PLAX chordal                 31.7  mm     23 - 38  LV fx shortening, PLAX chordal          35    %      >=29  LV PW thickness, ED                     11.6  mm     ---------  IVS/LV PW ratio, ED                     1.09         <=1.3  Stroke volume, 2D                       97    ml     ---------  Stroke volume/bsa, 2D                   39    ml/m^2 ---------  LV ejection fraction, 1-p A4C           57    %      ---------  LV end-diastolic volume, 2-p  88    ml     ---------  LV end-systolic volume, 2-p             39    ml     ---------  LV ejection fraction, 2-p               56    %      ---------  Stroke volume, 2-p                      49    ml     ---------  LV end-diastolic volume/bsa, 2-p        35    ml/m^2 ---------  LV end-systolic volume/bsa, 2-p         15    ml/m^2 ---------  Stroke volume/bsa, 2-p                  19.7  ml/m^2 ---------  LV e&', lateral                           10.4  cm/s   ---------  LV E/e&', lateral                        7.46         ---------  LV e&', medial                           8.05  cm/s   ---------  LV E/e&', medial                         9.64         ---------  LV e&', average                          9.23  cm/s   ---------  LV E/e&', average                        8.41         ---------  Longitudinal strain, TDI                19    %      ---------   Ventricular septum                      Value        Reference  IVS thickness, ED                       12.6  mm     ---------   LVOT                                    Value        Reference  LVOT ID, S                              24    mm     ---------  LVOT area  4.52  cm^2   ---------  LVOT peak velocity, S                   104   cm/s   ---------  LVOT mean velocity, S                   72.4  cm/s   ---------  LVOT VTI, S                             21.4  cm     ---------   Aorta                                   Value        Reference  Aortic root ID, ED                      37    mm     ---------   Left atrium                             Value        Reference  LA ID, A-P, ES                          38    mm     ---------  LA ID/bsa, A-P                          1.52  cm/m^2 <=2.2  LA volume, S                            49.6  ml     ---------  LA volume/bsa, S                        19.9  ml/m^2 ---------  LA volume, ES, 1-p A4C                  44.2  ml     ---------  LA volume/bsa, ES, 1-p A4C              17.7  ml/m^2 ---------  LA volume, ES, 1-p A2C                  52.5  ml     ---------  LA volume/bsa, ES, 1-p A2C              21    ml/m^2 ---------   Mitral valve                            Value        Reference  Mitral E-wave peak velocity             77.6  cm/s   ---------  Mitral A-wave peak velocity             53.4  cm/s   ---------  Mitral deceleration time         (H)    261   ms     150 - 230  Mitral peak gradient, D  2     mm Hg   ---------  Mitral E/A ratio, peak                  1.5          ---------   Pulmonary arteries                      Value        Reference  PA pressure, S, DP                      21    mm Hg  <=30   Tricuspid valve                         Value        Reference  Tricuspid regurg peak velocity          214   cm/s   ---------  Tricuspid peak RV-RA gradient           18    mm Hg  ---------   Systemic veins                          Value        Reference  Estimated CVP                           3     mm Hg  ---------   Right ventricle                         Value        Reference  RV pressure, S, DP                      21    mm Hg  <=30  RV s&', lateral, S                       13.9  cm/s   ---------   Pulmonic valve                          Value        Reference  Pulmonic regurg velocity, ED            88.2  cm/s   --------- Legend: (L)  and  (H)  mark values outside specified reference range. ------------------------------------------------------------------- Prepared and Electronically Authenticated by Donato Schultz, M.D. 2017-11-15T11:03:21    Assessment & Plan:  Plan  I have discontinued Mittie Bodo "Bill"'s gabapentin, ciprofloxacin, and tamsulosin. I am also having him maintain his buPROPion and HYDROcodone-acetaminophen.  Meds ordered this encounter  Medications  . HYDROcodone-acetaminophen (NORCO/VICODIN) 5-325 MG tablet    Sig: Take 1 tablet by mouth every 6 (six) hours as needed for moderate pain.    Dispense:  45 tablet    Refill:  0    Problem List Items Addressed This Visit      Unprioritized   Chronic pain of both knees    Refilled pain med uds and contract utd rto 3 months       Relevant Medications   HYDROcodone-acetaminophen (NORCO/VICODIN) 5-325 MG tablet      Follow-up: Return in about 3 months (around 05/12/2020), or if symptoms worsen or fail to improve,  for f/u chronic pain .  Donato Schultz, DO

## 2020-02-10 NOTE — Assessment & Plan Note (Signed)
Refilled pain med uds and contract utd rto 3 months

## 2020-02-10 NOTE — Patient Instructions (Signed)

## 2020-03-20 ENCOUNTER — Other Ambulatory Visit: Payer: Self-pay | Admitting: Family Medicine

## 2020-03-20 DIAGNOSIS — G8929 Other chronic pain: Secondary | ICD-10-CM

## 2020-03-21 MED ORDER — HYDROCODONE-ACETAMINOPHEN 5-325 MG PO TABS: 1.0000 | ORAL_TABLET | Freq: Four times a day (QID) | ORAL | 0 refills | Status: DC | PRN

## 2020-03-21 NOTE — Telephone Encounter (Signed)
Last written: 02/10/20 Last ov: 02/10/20 Next ov:05/14/20 Contract: 11/07/19 UDS: 11/07/19

## 2020-03-21 NOTE — Telephone Encounter (Signed)
PDMP okay, prescription sent 

## 2020-03-22 NOTE — Telephone Encounter (Signed)
Thank you :)

## 2020-03-30 ENCOUNTER — Other Ambulatory Visit (HOSPITAL_BASED_OUTPATIENT_CLINIC_OR_DEPARTMENT_OTHER): Payer: Self-pay | Admitting: Internal Medicine

## 2020-03-30 ENCOUNTER — Ambulatory Visit: Payer: 59 | Attending: Internal Medicine

## 2020-03-30 DIAGNOSIS — Z23 Encounter for immunization: Secondary | ICD-10-CM

## 2020-03-30 NOTE — Progress Notes (Signed)
   Covid-19 Vaccination Clinic  Name:  Trigg Delarocha    MRN: [id number] DOB: Jul 20, 1975  03/30/2020  Mr. Bolander was observed post Covid-19 immunization for 15 minutes without incident. He was provided with Vaccine Information Sheet and instruction to access the V-Safe system.   Mr. Dann was instructed to call 911 with any severe reactions post vaccine: Marland Kitchen Difficulty breathing  . Swelling of face and throat  . A fast heartbeat  . A bad rash all over body  . Dizziness and weakness   Immunizations Administered    Name Date Dose VIS Date Route   Moderna COVID-19 Vaccine 03/30/2020  1:42 PM 0.5 mL 02/08/2020 Intramuscular   Manufacturer: Gala Murdoch   Lot: [id number]   NDC: 09983-382-50

## 2020-04-03 MED FILL — MODERNA COVID-19 VACCINE 10: 100 | 1 days supply | Qty: 0 | Fill #0

## 2020-04-26 ENCOUNTER — Other Ambulatory Visit: Payer: Self-pay | Admitting: Internal Medicine

## 2020-04-26 DIAGNOSIS — G8929 Other chronic pain: Secondary | ICD-10-CM

## 2020-04-26 DIAGNOSIS — M25561 Pain in right knee: Secondary | ICD-10-CM

## 2020-04-26 MED ORDER — HYDROCODONE-ACETAMINOPHEN 5-325 MG PO TABS: 1.0000 | ORAL_TABLET | Freq: Four times a day (QID) | ORAL | 0 refills | Status: DC | PRN

## 2020-04-26 NOTE — Telephone Encounter (Signed)
Requesting: hydrocodone 5-325mg   Contract: 11/07/2019 UDS: 11/07/2019 Last Visit: 02/10/20 Next Visit: 05/14/2020 Last Refill: 03/21/2020 #45 and 0RF Pt sig: 1 tab q6h prn  Please Advise

## 2020-05-14 ENCOUNTER — Encounter: Payer: Self-pay | Admitting: Family Medicine

## 2020-05-14 ENCOUNTER — Other Ambulatory Visit: Payer: Self-pay

## 2020-05-14 ENCOUNTER — Ambulatory Visit: Payer: 59 | Admitting: Family Medicine

## 2020-05-14 VITALS — BP 132/80 | HR 82 | Temp 97.7°F | Resp 12 | Ht 67.5 in | Wt 284.4 lb

## 2020-05-14 DIAGNOSIS — M25561 Pain in right knee: Secondary | ICD-10-CM

## 2020-05-14 DIAGNOSIS — Z79899 Other long term (current) drug therapy: Secondary | ICD-10-CM | POA: Diagnosis not present

## 2020-05-14 DIAGNOSIS — G8929 Other chronic pain: Secondary | ICD-10-CM

## 2020-05-14 DIAGNOSIS — M25562 Pain in left knee: Secondary | ICD-10-CM | POA: Diagnosis not present

## 2020-05-14 MED ORDER — HYDROCODONE-ACETAMINOPHEN 5-325 MG PO TABS: 1.0000 | ORAL_TABLET | Freq: Four times a day (QID) | ORAL | 0 refills | Status: DC | PRN

## 2020-05-14 NOTE — Progress Notes (Signed)
Patient ID: Jonathan Hansen, male    DOB: 10-10-1975  Age: 45 y.o. MRN: [id number]    Subjective:  Subjective  HPI [name] presents for f/u chronic pain meds   No new complaints   Review of Systems  Constitutional: Negative for appetite change, diaphoresis, fatigue and unexpected weight change.  Eyes: Negative for pain, redness and visual disturbance.  Respiratory: Negative for cough, chest tightness, shortness of breath and wheezing.   Cardiovascular: Negative for chest pain, palpitations and leg swelling.  Endocrine: Negative for cold intolerance, heat intolerance, polydipsia, polyphagia and polyuria.  Genitourinary: Negative for difficulty urinating, dysuria and frequency.  Neurological: Negative for dizziness, light-headedness, numbness and headaches.    History Past Medical History:  Diagnosis Date  . Arthritis   . Back pain   . Chest pain   . Chronic knee pain   . Depression   . Dyspnea   . Elevated liver enzymes   . Fatty liver   . HLD (hyperlipidemia)   . HTN (hypertension)   . Joint pain   . Leg edema   . Osteoarthritis   . Pre-diabetes   . Vitamin D deficiency     He has a past surgical history that includes Vasectomy and Knee arthroscopy (Right).   His family history includes Coronary artery disease in his father; Diabetes in his father and paternal grandfather; Stroke in his father.He reports that he has been smoking cigarettes and e-cigarettes. He has a 25.00 pack-year smoking history. He has never used smokeless tobacco. He reports previous alcohol use. No history on file for drug use.  No current outpatient medications on file prior to visit.   No current facility-administered medications on file prior to visit.     Objective:  Objective  Physical Exam Vitals and nursing note reviewed.  Constitutional:      General: He is sleeping. Vital signs are normal.     Appearance: He is well-developed and well-nourished.  HENT:     Head:  Normocephalic and atraumatic.     Mouth/Throat:     Mouth: Oropharynx is clear and moist.  Eyes:     Extraocular Movements: EOM normal.     Pupils: Pupils are equal, round, and reactive to light.  Neck:     Thyroid: No thyromegaly.  Cardiovascular:     Rate and Rhythm: Normal rate and regular rhythm.     Heart sounds: No murmur heard.   Pulmonary:     Effort: Pulmonary effort is normal. No respiratory distress.     Breath sounds: Normal breath sounds. No wheezing or rales.  Chest:     Chest wall: No tenderness.  Musculoskeletal:        General: No tenderness or edema.     Cervical back: Normal range of motion and neck supple.  Skin:    General: Skin is warm and dry.  Neurological:     Mental Status: He is oriented to person, place, and time.  Psychiatric:        Mood and Affect: Mood and affect normal.        Behavior: Behavior normal.        Thought Content: Thought content normal.        Judgment: Judgment normal.    BP 132/80 (BP Location: Right Arm, Cuff Size: Large)   Pulse 82   Temp 97.7 F (36.5 C) (Oral)   Resp 12   Ht 5' 7.5" (1.715 m)   Wt 284 lb 6.4 oz (129 kg)  SpO2 97%   BMI 43.89 kg/m  Wt Readings from Last 3 Encounters:  05/14/20 284 lb 6.4 oz (129 kg)  02/10/20 276 lb 12.8 oz (125.6 kg)  11/07/19 273 lb 3.2 oz (123.9 kg)     Lab Results  Component Value Date   WBC 6.4 05/19/2019   HGB 15.1 05/19/2019   HCT 44.4 05/19/2019   PLT 226.0 05/19/2019   GLUCOSE 98 05/27/2019   CHOL 132 05/19/2019   TRIG 161.0 (H) 05/19/2019   HDL 41.80 05/19/2019   LDLDIRECT 46.0 05/19/2017   LDLCALC 58 05/19/2019   ALT 29 05/27/2019   AST 20 05/27/2019   NA 136 05/27/2019   K 3.8 05/27/2019   CL 101 05/27/2019   CREATININE 0.94 05/27/2019   BUN 16 05/27/2019   CO2 27 05/27/2019   TSH 1.570 08/13/2017   INR 1.2 (H) 05/19/2019   HGBA1C 5.4 05/19/2019    ECHOCARDIOGRAM COMPLETE  Result Date: 03/05/2016                       [facility]*                        [address]                        [place], [place] [address]                            [phone] ------------------------------------------------------------------- Transthoracic Echocardiography Patient:    Jonathan, Hansen MR #:       [id number] Study Date: 03/05/2016 Gender:     M Age:        40 Height:     172.7 cm Weight:     123.8 kg BSA:        2.5 m^2 Pt. Status: Room:  SONOGRAPHER  Nolon Rod, RDCS  PERFORMING   [facility], Outpatient  ATTENDING    Zola Button, Grayling Congress  ORDERING     Zola Button, Myrene Buddy R  REFERRING    [place] R cc: ------------------------------------------------------------------- LV EF: 50% -   55% ------------------------------------------------------------------- Indications:      Chest pain 786.51. ------------------------------------------------------------------- History:   Risk factors:  Current tobacco use. Hypertension. Obese.  ------------------------------------------------------------------- Study Conclusions - Left ventricle: The cavity size was normal. There was mild   concentric hypertrophy. Systolic function was normal. The   estimated ejection fraction was in the range of 50% to 55%. Wall   motion was normal; there were no regional wall motion   abnormalities. Left ventricular diastolic function parameters   were normal. GLS: -18.8. - Right ventricle: The cavity size was mildly dilated. Wall   thickness was normal. ------------------------------------------------------------------- Study data:  No prior study was available for comparison.  Study status:  Routine.  Procedure:  The patient&'s pain level was 2 on a scale of 1 to 10.  Study completion:  There were no complications.         Transthoracic echocardiography.  M-mode, complete 2D, spectral Doppler, and color Doppler.  Birthdate:  Patient birthdate: January 15, 1976.  Age:  Patient is 45 yr old.  Sex:  Gender: male.    BMI: 41.5 kg/m^2.  Blood pressure:     120/82  Patient status:   Outpatient.  Study date:  Study date: 03/05/2016. Study time: 10:13 AM.  Location:  Bedside. ------------------------------------------------------------------- ------------------------------------------------------------------- Left  ventricle:  The cavity size was normal. There was mild concentric hypertrophy. Systolic function was normal. The estimated ejection fraction was in the range of 50% to 55%. Wall motion was normal; there were no regional wall motion abnormalities. GLS: -18.8. Left ventricular diastolic function parameters were normal.  ------------------------------------------------------------------- Aortic valve:   Trileaflet; normal thickness leaflets. Mobility was not restricted.  Doppler:  Transvalvular velocity was within the normal range. There was no stenosis. There was no regurgitation.  ------------------------------------------------------------------- Aorta:  Aortic root: The aortic root was normal in size. ------------------------------------------------------------------- Mitral valve:   Structurally normal valve.   Mobility was not restricted.  Doppler:  Transvalvular velocity was within the normal range. There was no evidence for stenosis. There was no regurgitation.    Peak gradient (D): 2 mm Hg. ------------------------------------------------------------------- Left atrium:  The atrium was normal in size. ------------------------------------------------------------------- Right ventricle:  The cavity size was mildly dilated. Wall thickness was normal. Systolic function was normal. ------------------------------------------------------------------- Pulmonic valve:    Structurally normal valve.   Cusp separation was normal.  Doppler:  Transvalvular velocity was within the normal range. There was no evidence for stenosis. There was trivial regurgitation. ------------------------------------------------------------------- Tricuspid valve:   Structurally normal valve.    Doppler:  Transvalvular velocity was within the normal range. There was no regurgitation. ------------------------------------------------------------------- Pulmonary artery:   The main pulmonary artery was normal-sized. Systolic pressure was within the normal range. ------------------------------------------------------------------- Right atrium:  The atrium was normal in size. ------------------------------------------------------------------- Pericardium:  There was no pericardial effusion. ------------------------------------------------------------------- Systemic veins: Inferior vena cava: The vessel was normal in size. ------------------------------------------------------------------- Measurements  Left ventricle                          Value        Reference  LV ID, ED, PLAX chordal                 48.9  mm     43 - 52  LV ID, ES, PLAX chordal                 31.7  mm     23 - 38  LV fx shortening, PLAX chordal          35    %      >=29  LV PW thickness, ED                     11.6  mm     ---------  IVS/LV PW ratio, ED                     1.09         <=1.3  Stroke volume, 2D                       97    ml     ---------  Stroke volume/bsa, 2D                   39    ml/m^2 ---------  LV ejection fraction, 1-p A4C           57    %      ---------  LV end-diastolic volume, 2-p            88    ml     ---------  LV end-systolic volume, 2-p  39    ml     ---------  LV ejection fraction, 2-p               56    %      ---------  Stroke volume, 2-p                      49    ml     ---------  LV end-diastolic volume/bsa, 2-p        35    ml/m^2 ---------  LV end-systolic volume/bsa, 2-p         15    ml/m^2 ---------  Stroke volume/bsa, 2-p                  19.7  ml/m^2 ---------  LV e&', lateral                          10.4  cm/s   ---------  LV E/e&', lateral                        7.46         ---------  LV e&', medial                           8.05  cm/s   ---------  LV E/e&', medial                          9.64         ---------  LV e&', average                          9.23  cm/s   ---------  LV E/e&', average                        8.41         ---------  Longitudinal strain, TDI                19    %      ---------   Ventricular septum                      Value        Reference  IVS thickness, ED                       12.6  mm     ---------   LVOT                                    Value        Reference  LVOT ID, S                              24    mm     ---------  LVOT area                               4.52  cm^2   ---------  LVOT peak velocity, S  104   cm/s   ---------  LVOT mean velocity, S                   72.4  cm/s   ---------  LVOT VTI, S                             21.4  cm     ---------   Aorta                                   Value        Reference  Aortic root ID, ED                      37    mm     ---------   Left atrium                             Value        Reference  LA ID, A-P, ES                          38    mm     ---------  LA ID/bsa, A-P                          1.52  cm/m^2 <=2.2  LA volume, S                            49.6  ml     ---------  LA volume/bsa, S                        19.9  ml/m^2 ---------  LA volume, ES, 1-p A4C                  44.2  ml     ---------  LA volume/bsa, ES, 1-p A4C              17.7  ml/m^2 ---------  LA volume, ES, 1-p A2C                  52.5  ml     ---------  LA volume/bsa, ES, 1-p A2C              21    ml/m^2 ---------   Mitral valve                            Value        Reference  Mitral E-wave peak velocity             77.6  cm/s   ---------  Mitral A-wave peak velocity             53.4  cm/s   ---------  Mitral deceleration time         (H)    261   ms     150 - 230  Mitral peak gradient, D                 2     mm Hg  ---------  Mitral E/A ratio, peak  1.5          ---------   Pulmonary arteries                      Value        Reference  PA pressure, S, DP                      21    mm Hg  <=30    Tricuspid valve                         Value        Reference  Tricuspid regurg peak velocity          214   cm/s   ---------  Tricuspid peak RV-RA gradient           18    mm Hg  ---------   Systemic veins                          Value        Reference  Estimated CVP                           3     mm Hg  ---------   Right ventricle                         Value        Reference  RV pressure, S, DP                      21    mm Hg  <=30  RV s&', lateral, S                       13.9  cm/s   ---------   Pulmonic valve                          Value        Reference  Pulmonic regurg velocity, ED            88.2  cm/s   --------- Legend: (L)  and  (H)  mark values outside specified reference range. ------------------------------------------------------------------- Prepared and Electronically Authenticated by Donato Schultz, M.D. 2017-11-15T11:03:21    Assessment & Plan:  Plan  I have discontinued Mittie Bodo "Bill"'s buPROPion. I am also having him maintain his HYDROcodone-acetaminophen.  Meds ordered this encounter  Medications  . HYDROcodone-acetaminophen (NORCO/VICODIN) 5-325 MG tablet    Sig: Take 1 tablet by mouth every 6 (six) hours as needed for moderate pain.    Dispense:  45 tablet    Refill:  0    Problem List Items Addressed This Visit      Unprioritized   Chronic pain of both knees - Primary   Relevant Medications   HYDROcodone-acetaminophen (NORCO/VICODIN) 5-325 MG tablet   Other Relevant Orders   DRUG MONITORING, PANEL 8 WITH CONFIRMATION, URINE    Other Visit Diagnoses    High risk medication use       Relevant Orders   DRUG MONITORING, PANEL 8 WITH CONFIRMATION, URINE      Follow-up: Return in about 3 months (around 08/12/2020), or if symptoms worsen or fail to improve.  Donato Schultz, DO

## 2020-05-14 NOTE — Assessment & Plan Note (Signed)
Uds updated  Contract utd  Database reviewed

## 2020-05-14 NOTE — Patient Instructions (Signed)

## 2020-05-16 LAB — DRUG MONITORING, PANEL 8 WITH CONFIRMATION, URINE
6 Acetylmorphine: NEGATIVE ng/mL (ref <10)
Alcohol Metabolites: NEGATIVE ng/mL
Amphetamines: NEGATIVE ng/mL (ref <500)
Benzodiazepines: NEGATIVE ng/mL (ref <100)
Buprenorphine, Urine: NEGATIVE ng/mL (ref <5)
Cocaine Metabolite: NEGATIVE ng/mL (ref <150)
Codeine: NEGATIVE ng/mL (ref <50)
Creatinine: 40.7 mg/dL
Hydrocodone: 51 ng/mL — ABNORMAL HIGH (ref <50)
Hydromorphone: 89 ng/mL — ABNORMAL HIGH (ref <50)
MDMA: NEGATIVE ng/mL (ref <500)
Marijuana Metabolite: NEGATIVE ng/mL (ref <20)
Morphine: NEGATIVE ng/mL (ref <50)
Norhydrocodone: 108 ng/mL — ABNORMAL HIGH (ref <50)
Opiates: POSITIVE ng/mL — AB (ref <100)
Oxidant: NEGATIVE ug/mL
Oxycodone: NEGATIVE ng/mL (ref <100)
pH: 7.4 (ref 4.5–9.0)

## 2020-05-16 LAB — DM TEMPLATE

## 2020-06-21 ENCOUNTER — Telehealth (INDEPENDENT_AMBULATORY_CARE_PROVIDER_SITE_OTHER): Payer: 59 | Admitting: Medical

## 2020-06-21 DIAGNOSIS — F419 Anxiety disorder, unspecified: Secondary | ICD-10-CM

## 2020-06-21 MED ORDER — CLONAZEPAM 0.5 MG PO TABS
0.5000 mg | ORAL_TABLET | Freq: Two times a day (BID) | ORAL | 0 refills | Status: DC | PRN
Start: 2020-06-21 — End: 2020-07-06

## 2020-06-21 NOTE — Progress Notes (Signed)
   Subjective:    Patient ID: Jonathan Hansen, male    DOB: 1975/10/18, 45 y.o.   MRN: [id number]  HPI  Virtual Visit via Video Note  I connected with Mittie Bodo on 06/21/20 at 10:40 AM EST by a video enabled telemedicine application and verified that I am speaking with the correct person using two identifiers.  Location: Patient: home Provider: office.   Pt did not check his vitals. Asked him to check at home and my chart update [place].   I discussed the limitations of evaluation and management by telemedicine and the availability of in person appointments. The patient expressed understanding and agreed to proceed.  History of Present Illness:  Pt states his mom in hospital with copd and covid. Dad is in hospice and may pass at any time.  Pt states also severe stress at work and feeling like not able to cope. He states he having panic attacks.   Pt states not depressed but extreme anxiety/stress/panic attacks.  Similar severe anxiety when he went thru divorce years ago.      Observations/Objective:  General-no acute distress, pleasant, oriented. Lungs- on inspection lungs appear unlabored. Neck- no tracheal deviation or jvd on inspection. Neuro- gross motor function appears intact.  Assessment and Plan: History of 3 of recent history of recent severe anxiety and panic attacks.  This is associated with mom's hospitalization for COPD and Covid and dad who is in hospice.  Patient states his dad might pass away at any moment.  Not reporting depression presently.  Prescribe clonazepam 0.5 mg twice daily as needed anxiety/panic attack. Rx advisement given. Explained potential sedation and not to use with narcotics.  Follow up 2 weeks or as needed.  Follow Up Instructions:    I discussed the assessment and treatment plan with the patient. The patient was provided an opportunity to ask questions and all were answered. The patient agreed with the plan and demonstrated an  understanding of the instructions.   The patient was advised to call back or seek an in-person evaluation if the symptoms worsen or if the condition fails to improve as anticipated.  Time spent with patient today was 30  minutes which consisted of chart revdiew, discussing diagnosis, work up treatment and documentation.   Esperanza Richters, PA-C    Review of Systems  Constitutional: Negative for chills and fatigue.  HENT: Negative for congestion, drooling and ear discharge.   Respiratory: Negative for cough, chest tightness, shortness of breath and wheezing.   Cardiovascular: Negative for chest pain and palpitations.  Gastrointestinal: Negative for abdominal pain.  Genitourinary: Negative for dysuria.  Musculoskeletal: Negative for back pain and neck pain.  Neurological: Negative for dizziness, speech difficulty, weakness, numbness and headaches.  Psychiatric/Behavioral: Negative for agitation, behavioral problems, confusion, decreased concentration, dysphoric mood, sleep disturbance and suicidal ideas. The patient is nervous/anxious.        Objective:   Physical Exam        Assessment & Plan:

## 2020-06-21 NOTE — Patient Instructions (Addendum)
History of 3 of recent history of recent severe anxiety and panic attacks.  This is associated with mom's hospitalization for COPD and Covid and dad who is in hospice.  Patient states his dad might pass away at any moment.  Not reporting depression presently.  Prescribe clonazepam 0.5 mg twice daily as needed anxiety/panic attack. Rx advisement given. Explained potential sedation and not to use with narcotics.  Follow up 2 weeks or as needed.

## 2020-06-25 ENCOUNTER — Other Ambulatory Visit: Payer: Self-pay | Admitting: Family Medicine

## 2020-06-25 DIAGNOSIS — G8929 Other chronic pain: Secondary | ICD-10-CM

## 2020-06-25 DIAGNOSIS — M25561 Pain in right knee: Secondary | ICD-10-CM

## 2020-06-25 MED ORDER — HYDROCODONE-ACETAMINOPHEN 5-325 MG PO TABS: 1.0000 | ORAL_TABLET | Freq: Four times a day (QID) | ORAL | 0 refills | Status: DC | PRN

## 2020-06-25 NOTE — Telephone Encounter (Signed)
Requesting: hydrocodone 5-325mg  Contract:11/07/19 UDS:05/14/20 Last Visit: 05/14/20 Next Visit: 08/23/20 Last Refill: 05/14/2020 #45 and 0RF  Please Advise

## 2020-07-06 ENCOUNTER — Other Ambulatory Visit: Payer: Self-pay

## 2020-07-06 ENCOUNTER — Ambulatory Visit: Payer: 59 | Admitting: Family Medicine

## 2020-07-06 ENCOUNTER — Encounter: Payer: Self-pay | Admitting: Family Medicine

## 2020-07-06 VITALS — BP 112/80 | HR 71 | Temp 98.5°F | Resp 18 | Ht 67.5 in | Wt 275.0 lb

## 2020-07-06 DIAGNOSIS — Z8249 Family history of ischemic heart disease and other diseases of the circulatory system: Secondary | ICD-10-CM | POA: Diagnosis not present

## 2020-07-06 DIAGNOSIS — M170 Bilateral primary osteoarthritis of knee: Secondary | ICD-10-CM

## 2020-07-06 DIAGNOSIS — F419 Anxiety disorder, unspecified: Secondary | ICD-10-CM | POA: Diagnosis not present

## 2020-07-06 MED ORDER — CLONAZEPAM 0.5 MG PO TABS: 0.5000 mg | ORAL_TABLET | Freq: Two times a day (BID) | ORAL | 0 refills | Status: DC | PRN

## 2020-07-06 MED ORDER — SERTRALINE HCL 50 MG PO TABS: 50.0000 mg | ORAL_TABLET | Freq: Every day | ORAL | 3 refills | Status: DC

## 2020-07-06 NOTE — Assessment & Plan Note (Signed)
Improved with klonopin but concerned because pt takes oxycodone too Will start zoloft 50 mg  1/2 tab qd for 1 week then inc to 1 tab daily Ok to con't prn klonopin and f/u 1 month
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
## 2020-07-06 NOTE — Assessment & Plan Note (Signed)
S/p knee replacements con't prn pain med  Uds/ contract utd

## 2020-07-06 NOTE — Patient Instructions (Signed)
[email or web address]">  Generalized Anxiety Disorder, Adult Generalized anxiety disorder (GAD) is a mental health condition. Unlike normal worries, anxiety related to GAD is not triggered by a specific event. These worries do not fade or get better with time. GAD interferes with relationships, work, and school. GAD symptoms can vary from mild to severe. People with severe GAD can have intense waves of anxiety with physical symptoms that are similar to panic attacks. What are the causes? The exact cause of GAD is not known, but the following are believed to have an impact:  Differences in natural brain chemicals.  Genes passed down from parents to children.  Differences in the way threats are perceived.  Development during childhood.  Personality. What increases the risk? The following factors may make you more likely to develop this condition:  Being male.  Having a family history of anxiety disorders.  Being very shy.  Experiencing very stressful life events, such as the death of a loved one.  Having a very stressful family environment. What are the signs or symptoms? People with GAD often worry excessively about many things in their lives, such as their health and family. Symptoms may also include:  Mental and emotional symptoms: ? Worrying excessively about natural disasters. ? Fear of being late. ? Difficulty concentrating. ? Fears that others are judging your performance.  Physical symptoms: ? Fatigue. ? Headaches, muscle tension, muscle twitches, trembling, or feeling shaky. ? Feeling like your heart is pounding or beating very fast. ? Feeling out of breath or like you cannot take a deep breath. ? Having trouble falling asleep or staying asleep, or experiencing restlessness. ? Sweating. ? Nausea, diarrhea, or irritable bowel syndrome (IBS).  Behavioral symptoms: ? Experiencing erratic moods or irritability. ? Avoidance of new situations. ? Avoidance of  people. ? Extreme difficulty making decisions. How is this diagnosed? This condition is diagnosed based on your symptoms and medical history. You will also have a physical exam. Your health care provider may perform tests to rule out other possible causes of your symptoms. To be diagnosed with GAD, a person must have anxiety that:  Is out of his or her control.  Affects several different aspects of his or her life, such as work and relationships.  Causes distress that makes him or her unable to take part in normal activities.  Includes at least three symptoms of GAD, such as restlessness, fatigue, trouble concentrating, irritability, muscle tension, or sleep problems. Before your health care provider can confirm a diagnosis of GAD, these symptoms must be present more days than they are not, and they must last for 6 months or longer. How is this treated? This condition may be treated with:  Medicine. Antidepressant medicine is usually prescribed for long-term daily control. Anti-anxiety medicines may be added in severe cases, especially when panic attacks occur.  Talk therapy (psychotherapy). Certain types of talk therapy can be helpful in treating GAD by providing support, education, and guidance. Options include: ? Cognitive behavioral therapy (CBT). People learn coping skills and self-calming techniques to ease their physical symptoms. They learn to identify unrealistic thoughts and behaviors and to replace them with more appropriate thoughts and behaviors. ? Acceptance and commitment therapy (ACT). This treatment teaches people how to be mindful as a way to cope with unwanted thoughts and feelings. ? Biofeedback. This process trains you to manage your body's response (physiological response) through breathing techniques and relaxation methods. You will work with a therapist while machines are used to monitor your physical   symptoms.  Stress management techniques. These include yoga,  meditation, and exercise. A mental health specialist can help determine which treatment is best for you. Some people see improvement with one type of therapy. However, other people require a combination of therapies.   Follow these instructions at home: Lifestyle  Maintain a consistent routine and schedule.  Anticipate stressful situations. Create a plan, and allow extra time to work with your plan.  Practice stress management or self-calming techniques that you have learned from your therapist or your health care provider. General instructions  Take over-the-counter and prescription medicines only as told by your health care provider.  Understand that you are likely to have setbacks. Accept this and be kind to yourself as you persist to take better care of yourself.  Recognize and accept your accomplishments, even if you judge them as small.  Keep all follow-up visits as told by your health care provider. This is important. Contact a health care provider if:  Your symptoms do not get better.  Your symptoms get worse.  You have signs of depression, such as: ? A persistently sad or irritable mood. ? Loss of enjoyment in activities that used to bring you joy. ? Change in weight or eating. ? Changes in sleeping habits. ? Avoiding friends or family members. ? Loss of energy for normal tasks. ? Feelings of guilt or worthlessness. Get help right away if:  You have serious thoughts about hurting yourself or others. If you ever feel like you may hurt yourself or others, or have thoughts about taking your own life, get help right away. Go to your nearest emergency department or:  Call your local emergency services (911 in the [place]  Call a suicide crisis helpline, such as the National Suicide Prevention Lifeline at 1-800-273-8255. This is open 24 hours a day in the [place]  Text the Crisis Text Line at 741741 (in the [place] Summary  Generalized anxiety disorder (GAD) is a mental  health condition that involves worry that is not triggered by a specific event.  People with GAD often worry excessively about many things in their lives, such as their health and family.  GAD may cause symptoms such as restlessness, trouble concentrating, sleep problems, frequent sweating, nausea, diarrhea, headaches, and trembling or muscle twitching.  A mental health specialist can help determine which treatment is best for you. Some people see improvement with one type of therapy. However, other people require a combination of therapies. This information is not intended to replace advice given to you by your health care provider. Make sure you discuss any questions you have with your health care provider. Document Revised: 01/26/2019 Document Reviewed: 01/26/2019 Elsevier Patient Education  2021 [employer]vier Inc.  

## 2020-07-17 ENCOUNTER — Other Ambulatory Visit: Payer: Self-pay

## 2020-07-17 ENCOUNTER — Ambulatory Visit (INDEPENDENT_AMBULATORY_CARE_PROVIDER_SITE_OTHER): Payer: 59

## 2020-07-17 ENCOUNTER — Ambulatory Visit (HOSPITAL_BASED_OUTPATIENT_CLINIC_OR_DEPARTMENT_OTHER): Payer: 59

## 2020-07-17 DIAGNOSIS — Z8249 Family history of ischemic heart disease and other diseases of the circulatory system: Secondary | ICD-10-CM | POA: Diagnosis not present

## 2020-07-23 ENCOUNTER — Other Ambulatory Visit: Payer: Self-pay | Admitting: Family Medicine

## 2020-07-23 DIAGNOSIS — G8929 Other chronic pain: Secondary | ICD-10-CM

## 2020-07-24 MED ORDER — HYDROCODONE-ACETAMINOPHEN 5-325 MG PO TABS: 1.0000 | ORAL_TABLET | Freq: Four times a day (QID) | ORAL | 0 refills | Status: DC | PRN

## 2020-07-24 NOTE — Telephone Encounter (Signed)
Requesting: hydrocodone 5-325mg  Contract:11/07/19 UDS: 05/14/20 Last Visit: 07/06/20 Next Visit: 08/23/20 Last Refill: 06/25/2020 #45 and 0RF  Please Advise

## 2020-07-29 ENCOUNTER — Other Ambulatory Visit: Payer: Self-pay | Admitting: Family Medicine

## 2020-07-29 DIAGNOSIS — F419 Anxiety disorder, unspecified: Secondary | ICD-10-CM

## 2020-08-23 ENCOUNTER — Other Ambulatory Visit: Payer: Self-pay

## 2020-08-23 ENCOUNTER — Ambulatory Visit: Payer: 59 | Admitting: Family Medicine

## 2020-08-23 ENCOUNTER — Encounter: Payer: Self-pay | Admitting: Family Medicine

## 2020-08-23 VITALS — BP 130/90 | HR 81 | Temp 98.2°F | Resp 18 | Ht 67.5 in | Wt 273.0 lb

## 2020-08-23 DIAGNOSIS — M25561 Pain in right knee: Secondary | ICD-10-CM | POA: Diagnosis not present

## 2020-08-23 DIAGNOSIS — F419 Anxiety disorder, unspecified: Secondary | ICD-10-CM | POA: Diagnosis not present

## 2020-08-23 DIAGNOSIS — G8929 Other chronic pain: Secondary | ICD-10-CM

## 2020-08-23 DIAGNOSIS — M25562 Pain in left knee: Secondary | ICD-10-CM

## 2020-08-23 DIAGNOSIS — M170 Bilateral primary osteoarthritis of knee: Secondary | ICD-10-CM

## 2020-08-23 DIAGNOSIS — E785 Hyperlipidemia, unspecified: Secondary | ICD-10-CM

## 2020-08-23 MED ORDER — SERTRALINE HCL 50 MG PO TABS: 1.0000 | ORAL_TABLET | Freq: Every day | ORAL | 3 refills | Status: DC

## 2020-08-23 MED ORDER — HYDROCODONE-ACETAMINOPHEN 5-325 MG PO TABS
1.0000 | ORAL_TABLET | Freq: Four times a day (QID) | ORAL | 0 refills | Status: DC | PRN
Start: 2020-08-23 — End: 2020-09-19

## 2020-08-23 NOTE — Assessment & Plan Note (Signed)
Improved Cont zoloft

## 2020-08-23 NOTE — Assessment & Plan Note (Signed)
Will check labs next visit-- lab closed today

## 2020-08-23 NOTE — Patient Instructions (Signed)
[email or web address]">  Generalized Anxiety Disorder, Adult Generalized anxiety disorder (GAD) is a mental health condition. Unlike normal worries, anxiety related to GAD is not triggered by a specific event. These worries do not fade or get better with time. GAD interferes with relationships, work, and school. GAD symptoms can vary from mild to severe. People with severe GAD can have intense waves of anxiety with physical symptoms that are similar to panic attacks. What are the causes? The exact cause of GAD is not known, but the following are believed to have an impact:  Differences in natural brain chemicals.  Genes passed down from parents to children.  Differences in the way threats are perceived.  Development during childhood.  Personality. What increases the risk? The following factors may make you more likely to develop this condition:  Being male.  Having a family history of anxiety disorders.  Being very shy.  Experiencing very stressful life events, such as the death of a loved one.  Having a very stressful family environment. What are the signs or symptoms? People with GAD often worry excessively about many things in their lives, such as their health and family. Symptoms may also include:  Mental and emotional symptoms: ? Worrying excessively about natural disasters. ? Fear of being late. ? Difficulty concentrating. ? Fears that others are judging your performance.  Physical symptoms: ? Fatigue. ? Headaches, muscle tension, muscle twitches, trembling, or feeling shaky. ? Feeling like your heart is pounding or beating very fast. ? Feeling out of breath or like you cannot take a deep breath. ? Having trouble falling asleep or staying asleep, or experiencing restlessness. ? Sweating. ? Nausea, diarrhea, or irritable bowel syndrome (IBS).  Behavioral symptoms: ? Experiencing erratic moods or irritability. ? Avoidance of new situations. ? Avoidance of  people. ? Extreme difficulty making decisions. How is this diagnosed? This condition is diagnosed based on your symptoms and medical history. You will also have a physical exam. Your health care provider may perform tests to rule out other possible causes of your symptoms. To be diagnosed with GAD, a person must have anxiety that:  Is out of his or her control.  Affects several different aspects of his or her life, such as work and relationships.  Causes distress that makes him or her unable to take part in normal activities.  Includes at least three symptoms of GAD, such as restlessness, fatigue, trouble concentrating, irritability, muscle tension, or sleep problems. Before your health care provider can confirm a diagnosis of GAD, these symptoms must be present more days than they are not, and they must last for 6 months or longer. How is this treated? This condition may be treated with:  Medicine. Antidepressant medicine is usually prescribed for long-term daily control. Anti-anxiety medicines may be added in severe cases, especially when panic attacks occur.  Talk therapy (psychotherapy). Certain types of talk therapy can be helpful in treating GAD by providing support, education, and guidance. Options include: ? Cognitive behavioral therapy (CBT). People learn coping skills and self-calming techniques to ease their physical symptoms. They learn to identify unrealistic thoughts and behaviors and to replace them with more appropriate thoughts and behaviors. ? Acceptance and commitment therapy (ACT). This treatment teaches people how to be mindful as a way to cope with unwanted thoughts and feelings. ? Biofeedback. This process trains you to manage your body's response (physiological response) through breathing techniques and relaxation methods. You will work with a therapist while machines are used to monitor your physical   symptoms.  Stress management techniques. These include yoga,  meditation, and exercise. A mental health specialist can help determine which treatment is best for you. Some people see improvement with one type of therapy. However, other people require a combination of therapies.   Follow these instructions at home: Lifestyle  Maintain a consistent routine and schedule.  Anticipate stressful situations. Create a plan, and allow extra time to work with your plan.  Practice stress management or self-calming techniques that you have learned from your therapist or your health care provider. General instructions  Take over-the-counter and prescription medicines only as told by your health care provider.  Understand that you are likely to have setbacks. Accept this and be kind to yourself as you persist to take better care of yourself.  Recognize and accept your accomplishments, even if you judge them as small.  Keep all follow-up visits as told by your health care provider. This is important. Contact a health care provider if:  Your symptoms do not get better.  Your symptoms get worse.  You have signs of depression, such as: ? A persistently sad or irritable mood. ? Loss of enjoyment in activities that used to bring you joy. ? Change in weight or eating. ? Changes in sleeping habits. ? Avoiding friends or family members. ? Loss of energy for normal tasks. ? Feelings of guilt or worthlessness. Get help right away if:  You have serious thoughts about hurting yourself or others. If you ever feel like you may hurt yourself or others, or have thoughts about taking your own life, get help right away. Go to your nearest emergency department or:  Call your local emergency services (911 in the [place]  Call a suicide crisis helpline, such as the National Suicide Prevention Lifeline at 1-800-273-8255. This is open 24 hours a day in the [place]  Text the Crisis Text Line at 741741 (in the [place] Summary  Generalized anxiety disorder (GAD) is a mental  health condition that involves worry that is not triggered by a specific event.  People with GAD often worry excessively about many things in their lives, such as their health and family.  GAD may cause symptoms such as restlessness, trouble concentrating, sleep problems, frequent sweating, nausea, diarrhea, headaches, and trembling or muscle twitching.  A mental health specialist can help determine which treatment is best for you. Some people see improvement with one type of therapy. However, other people require a combination of therapies. This information is not intended to replace advice given to you by your health care provider. Make sure you discuss any questions you have with your health care provider. Document Revised: 01/26/2019 Document Reviewed: 01/26/2019 Elsevier Patient Education  2021 [employer]vier Inc.  

## 2020-08-23 NOTE — Assessment & Plan Note (Signed)
S/p surgery con't pain meds

## 2020-08-23 NOTE — Assessment & Plan Note (Signed)
con't pain meds uds / contract utd Data base reviewed

## 2020-08-23 NOTE — Progress Notes (Signed)
Subjective:   By signing my name below, I, Shehryar Baig, attest that this documentation has been prepared under the direction and in the presence of Dr. Seabron Spates, DO. 08/23/2020      Patient ID: Jonathan Hansen, male    DOB: Sep 06, 1975, 45 y.o.   MRN: [id number]  Chief Complaint  Patient presents with  . Anxiety  . Follow-up    HPI Patient is in today for a office visit. He is complaining of pain in his right shoulder because of his arthritis. He also notes it feels like it might dislocate when he reaches down with it. He is taking 5-325 mg hydrocodone 4x daily PO to manage his arthritis pain but finds mild relief. He is managing his anxiety well on 50 mg Zoloft daily PO. He is interested in getting a skin tag at his left axillary removed. He notes getting a cut on his proximal phalanges of his left hand, third digit. He resolved the issues himself but complains of loss of feeling. He is not interested in getting it treated at this time. He denies having any fever, ear pain, congestion, sinus pain, sore throat, eye pain, chest pain, palpations, cough, shortness of breath, wheezing, nausea, vomiting, diarrhea, constipation, blood in stool, dysuria, frequency, hematuria, dizziness, or headaches at this time. His father passed away 2 months ago at 33 which increased his anxiety at that time.   Past Medical History:  Diagnosis Date  . Arthritis   . Back pain   . Chest pain   . Chronic knee pain   . Depression   . Dyspnea   . Elevated liver enzymes   . Fatty liver   . HLD (hyperlipidemia)   . HTN (hypertension)   . Joint pain   . Leg edema   . Osteoarthritis   . Pre-diabetes   . Vitamin D deficiency     Past Surgical History:  Procedure Laterality Date  . KNEE ARTHROSCOPY Right   . VASECTOMY      Family History  Problem Relation Age of Onset  . COPD Mother   . Lung cancer Mother   . Stroke Father   . Coronary artery disease Father        carotid stenosis  .  Diabetes Father   . Diabetes Paternal Grandfather     Social History   Socioeconomic History  . Marital status: Married    Spouse name: April Heidel  . Number of children: 1  . Years of education: Not on file  . Highest education level: Not on file  Occupational History  . 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 Smoking status: Current Every Day Smoker    Packs/day: 1.00    Years: 25.00    Pack years: 25.00    Types: Cigarettes, E-cigarettes  . Smokeless tobacco: Never Used  . Tobacco comment: now vape no cigarettes  Substance and Sexual Activity  . Alcohol use: Not Currently    Comment: nothing in months 06/22/2018  . Drug use: Not on file  . Sexual activity: Not on file  Other Topics Concern  . Not on file  Social History Narrative  . Not on file   Social Determinants of Health   Financial Resource Strain: Not on file  Food Insecurity: Not on file  Transportation Needs: Not on file  Physical Activity: Not on file  Stress: Not on file  Social Connections: Not on file  Intimate Partner Violence: Not on file    Outpatient  Medications Prior to Visit  Medication Sig Dispense Refill  . COVID-19 mRNA vaccine, Moderna, 100 MCG/0.5ML injection INJECT AS DIRECTED .25 mL 0  . HYDROcodone-acetaminophen (NORCO/VICODIN) 5-325 MG tablet Take 1 tablet by mouth every 6 (six) hours as needed for moderate pain. 45 tablet 0  . sertraline (ZOLOFT) 50 MG tablet Take 1 tablet (50 mg total) by mouth daily. 90 tablet 0  . clonazePAM (KLONOPIN) 0.5 MG tablet Take 1 tablet (0.5 mg total) by mouth 2 (two) times daily as needed for anxiety. (Patient not taking: Reported on 08/23/2020) 28 tablet 0   No facility-administered medications prior to visit.    Allergies  Allergen Reactions  . Ibuprofen Other (See Comments)    Bleeding  . Penicillins     Review of Systems  Constitutional: Negative for fever.  HENT: Negative for congestion, ear pain, sinus pain and sore throat.    Eyes: Negative for pain.  Respiratory: Negative for cough, shortness of breath and wheezing.   Cardiovascular: Negative for chest pain and palpitations.  Gastrointestinal: Negative for blood in stool, constipation, diarrhea, nausea and vomiting.  Genitourinary: Negative for dysuria, frequency and hematuria.  Musculoskeletal:       (+)Pain in right shoulder  Neurological: Negative for dizziness and headaches.       Objective:    Physical Exam Constitutional:      Appearance: Normal appearance.  HENT:     Head: Normocephalic and atraumatic.     Right Ear: External ear normal.     Left Ear: External ear normal.  Eyes:     Extraocular Movements: Extraocular movements intact.     Pupils: Pupils are equal, round, and reactive to light.  Cardiovascular:     Rate and Rhythm: Normal rate and regular rhythm.     Pulses: Normal pulses.     Heart sounds: Normal heart sounds.  Pulmonary:     Effort: Pulmonary effort is normal. No respiratory distress.     Breath sounds: Normal breath sounds. No wheezing, rhonchi or rales.  Skin:    General: Skin is warm and dry.  Neurological:     Mental Status: He is alert and oriented to person, place, and time.  Psychiatric:        Behavior: Behavior normal.     BP 130/90 (BP Location: Right Arm, Patient Position: Sitting, Cuff Size: Large)   Pulse 81   Temp 98.2 F (36.8 C) (Oral)   Resp 18   Ht 5' 7.5" (1.715 m)   Wt 273 lb (123.8 kg)   SpO2 97%   BMI 42.13 kg/m  Wt Readings from Last 3 Encounters:  08/23/20 273 lb (123.8 kg)  07/06/20 275 lb (124.7 kg)  05/14/20 284 lb 6.4 oz (129 kg)    Diabetic Foot Exam - Simple   No data filed    Lab Results  Component Value Date   WBC 6.4 05/19/2019   HGB 15.1 05/19/2019   HCT 44.4 05/19/2019   PLT 226.0 05/19/2019   GLUCOSE 98 05/27/2019   CHOL 132 05/19/2019   TRIG 161.0 (H) 05/19/2019   HDL 41.80 05/19/2019   LDLDIRECT 46.0 05/19/2017   LDLCALC 58 05/19/2019   ALT 29  05/27/2019   AST 20 05/27/2019   NA 136 05/27/2019   K 3.8 05/27/2019   CL 101 05/27/2019   CREATININE 0.94 05/27/2019   BUN 16 05/27/2019   CO2 27 05/27/2019   TSH 1.570 08/13/2017   INR 1.2 (H) 05/19/2019   HGBA1C 5.4 05/19/2019  Lab Results  Component Value Date   TSH 1.570 08/13/2017   Lab Results  Component Value Date   WBC 6.4 05/19/2019   HGB 15.1 05/19/2019   HCT 44.4 05/19/2019   MCV 89.5 05/19/2019   PLT 226.0 05/19/2019   Lab Results  Component Value Date   NA 136 05/27/2019   K 3.8 05/27/2019   CO2 27 05/27/2019   GLUCOSE 98 05/27/2019   BUN 16 05/27/2019   CREATININE 0.94 05/27/2019   BILITOT 0.6 05/27/2019   ALKPHOS 42 05/27/2019   AST 20 05/27/2019   ALT 29 05/27/2019   PROT 7.7 05/27/2019   ALBUMIN 4.4 05/27/2019   CALCIUM 9.6 05/27/2019   GFR 86.38 05/27/2019   Lab Results  Component Value Date   CHOL 132 05/19/2019   Lab Results  Component Value Date   HDL 41.80 05/19/2019   Lab Results  Component Value Date   LDLCALC 58 05/19/2019   Lab Results  Component Value Date   TRIG 161.0 (H) 05/19/2019   Lab Results  Component Value Date   CHOLHDL 3 05/19/2019   Lab Results  Component Value Date   HGBA1C 5.4 05/19/2019       Assessment & Plan:   Problem List Items Addressed This Visit      Unprioritized   Anxiety    Improved Cont zoloft       Relevant Medications   sertraline (ZOLOFT) 50 MG tablet   Chronic pain of both knees    S/p surgery con't pain meds       Relevant Medications   HYDROcodone-acetaminophen (NORCO/VICODIN) 5-325 MG tablet   sertraline (ZOLOFT) 50 MG tablet   HLD (hyperlipidemia) - Primary    Will check labs next visit-- lab closed today      Relevant Orders   Comprehensive metabolic panel   Lipid panel   Primary osteoarthritis of both knees    con't pain meds uds / contract utd Data base reviewed       Relevant Medications   HYDROcodone-acetaminophen (NORCO/VICODIN) 5-325 MG tablet        Meds ordered this encounter  Medications  . HYDROcodone-acetaminophen (NORCO/VICODIN) 5-325 MG tablet    Sig: Take 1 tablet by mouth every 6 (six) hours as needed for moderate pain.    Dispense:  45 tablet    Refill:  0  . sertraline (ZOLOFT) 50 MG tablet    Sig: Take 1 tablet (50 mg total) by mouth daily.    Dispense:  90 tablet    Refill:  3    I, Donato Schultz, DO, personally preformed the services described in this documentation.  All medical record entries made by the scribe were at my direction and in my presence.  I have reviewed the chart and discharge instructions (if applicable) and agree that the record reflects my personal performance and is accurate and complete. 08/23/2020   I,Shehryar Baig,acting as a scribe for Donato Schultz, DO.,have documented all relevant documentation on the behalf of Donato Schultz, DO,as directed by  Donato Schultz, DO while in the presence of Donato Schultz, DO.   Donato Schultz, DO

## 2020-09-19 ENCOUNTER — Other Ambulatory Visit: Payer: Self-pay | Admitting: Family Medicine

## 2020-09-19 DIAGNOSIS — M25562 Pain in left knee: Secondary | ICD-10-CM

## 2020-09-19 DIAGNOSIS — G8929 Other chronic pain: Secondary | ICD-10-CM

## 2020-09-19 MED ORDER — HYDROCODONE-ACETAMINOPHEN 5-325 MG PO TABS: 1.0000 | ORAL_TABLET | Freq: Four times a day (QID) | ORAL | 0 refills | Status: DC | PRN

## 2020-09-19 NOTE — Telephone Encounter (Signed)
Requesting: Hydrocodone Contract:05/14/2020 UDS:05/14/2020 low risk Last Visit:08/23/2020 Next Visit:11/23/2020 Last Refill:08/23/2020  Please Advise

## 2020-10-15 ENCOUNTER — Other Ambulatory Visit: Payer: Self-pay | Admitting: Family Medicine

## 2020-10-15 DIAGNOSIS — G8929 Other chronic pain: Secondary | ICD-10-CM

## 2020-10-16 MED ORDER — HYDROCODONE-ACETAMINOPHEN 5-325 MG PO TABS: 1.0000 | ORAL_TABLET | Freq: Four times a day (QID) | ORAL | 0 refills | Status: DC | PRN

## 2020-10-16 NOTE — Telephone Encounter (Signed)
Requesting: hydrocodone Contract:  05/14/20 UDS: 05/14/20 Last Visit: 08/23/20 Next Visit: 11/23/20 Last Refill: 09/19/20  Please Advise

## 2020-11-14 ENCOUNTER — Other Ambulatory Visit: Payer: Self-pay | Admitting: Family Medicine

## 2020-11-14 DIAGNOSIS — M25562 Pain in left knee: Secondary | ICD-10-CM

## 2020-11-14 DIAGNOSIS — G8929 Other chronic pain: Secondary | ICD-10-CM

## 2020-11-14 MED ORDER — HYDROCODONE-ACETAMINOPHEN 5-325 MG PO TABS: 1.0000 | ORAL_TABLET | Freq: Four times a day (QID) | ORAL | 0 refills | Status: DC | PRN

## 2020-11-14 NOTE — Telephone Encounter (Signed)
Requesting: hydrocodone 5-325mg  Contract: 11/07/2019 UDS: 05/14/2020 Last Visit: 08/23/2020  Next Visit: 11/23/2020 Last Refill: 10/16/2020 #45 and 0RF  Please Advise

## 2020-11-23 ENCOUNTER — Ambulatory Visit: Payer: 59 | Admitting: Family Medicine

## 2020-11-23 ENCOUNTER — Other Ambulatory Visit: Payer: Self-pay

## 2020-11-23 ENCOUNTER — Encounter: Payer: Self-pay | Admitting: Family Medicine

## 2020-11-23 VITALS — BP 152/88 | Temp 81.0°F | Ht 67.5 in | Wt 281.0 lb

## 2020-11-23 DIAGNOSIS — M25561 Pain in right knee: Secondary | ICD-10-CM | POA: Diagnosis not present

## 2020-11-23 DIAGNOSIS — G8929 Other chronic pain: Secondary | ICD-10-CM

## 2020-11-23 DIAGNOSIS — M25562 Pain in left knee: Secondary | ICD-10-CM | POA: Diagnosis not present

## 2020-11-23 NOTE — Patient Instructions (Signed)
Pain Medicine Instructions You may need pain medicine after an injury or illness. There are many types of pain medicine. Two common types of pain medicine are: Non-opioid pain medicines. These include: Over-the-counter (OTC) medicines such as acetaminophen or non-steroidal anti-inflammatory medicines (NSAIDS). Prescription medicines such as gabapentin or pregabalin. Opioid prescription pain medicines. These include: Hydrocodone. Oxycodone. Tramadol. It is important to follow instructions from your health care provider when you are taking prescription pain medicines. It is also important to take steps to keep others safe while you are taking pain medicine, such as avoiding certainactivities and storing your medicines safely. How can pain medicine affect me? Pain medicine may be prescribed to relieve most or all of your pain. It may not be possible to make all of your pain go away, but you should be comfortableenough to move, breathe, and do normal activities. Opioid pain medicines can cause side effects, such as: Constipation. Nausea. Vomiting. Drowsiness. Confusion. Taking opioids for nonmedical reasons even though taking them hurts your health and well-being (opioid use disorder). Difficulty breathing (respiratory depression). Using opioid pain medicines for longer than 3 days increases your risk of theseside effects. Taking opioid pain medicine for a long period of time can affect your ability to do daily tasks. It also puts you at risk for: Motor vehicle accidents. Depression. Suicide. Heart attack. Taking too much of the medicine (overdose), which can sometimes lead to death. What actions can I take to lower my risk of problems? Take your medicine as directed  Take your pain medicine exactly as told by your health care provider. Take it only when you need it. If your pain gets less severe, you may take less than your prescribed dose if your health care provider approves. If you  are not having pain, do nottake pain medicine unless your health care provider tells you to take it. If your pain medicine contains acetaminophen, do not take any other acetaminophen while taking this medicine. Acetaminophen is found in many over-the-counter and prescription medicines. An overdose of acetaminophen can result in severe liver damage. If your pain is severe, do nottry to treat it yourself by taking more pills than instructed on your prescription. Contact your health care provider for help. Write down the times when you take your pain medicine. It is easy to become confused while on pain medicine. Writing the time down can help you avoid overdose. Take other over-the-counter or prescription medicines only as told by your health care provider. To prevent or treat constipation while you are taking prescription pain medicine: Drink enough water to keep your urine pale yellow. Eat more fruits and vegetables. Use a stool softener as recommended or prescribed by your health care provider.  Restrict your activity as directed While you are taking prescription pain medicine, and for 8 hours after your last dose, follow these instructions: Do not drive. Do not use machinery or power tools. Do not sign legal documents. Do not drink alcohol. Do not take sleeping pills. Do not supervise children by yourself. Do not participate in activities that require climbing or being in high places. Do not enter a body of water--such as a lake, river, ocean, spa, or swimming pool--unless an adult is nearby who can monitor and help you.  Keep others safe while you are taking prescription pain medicine Keep prescription pain medicine in a locked cabinet, or in a secure area where children and pets cannot reach it. Never share your prescription pain medicine with anyone. Do not save any leftover   pills. If you have leftover medicine, you can: Bring the medicine to a prescription take-back program. This is  usually offered by the county or law enforcement. Bring it to a pharmacy that has a drug disposal container. Throw it out in the trash. Check the label or package insert of your medicine to see whether this is safe to do. If it is safe to throw it out, remove the medicine from the container and mix it with material that makes it unusable (such as pet waste) before putting it in the trash. Know your pain treatment plan To manage your pain successfully, you and your health care provider need to understand each other and work together. To do this: Discuss the goals of your treatment, including how much pain you might expect to have and how you will manage the pain. Ask your health care provider to refer you to one or more specialists who can help you manage pain in other ways. Some other ways of managing pain include physical therapy, exercise, massage, or biofeedback. Review the risks and benefits of taking pain medicines for your condition. Be honest about the amount of medicines you take, and about any drug or alcohol use. Get pain medicine prescriptions from only one health care provider. Keep all follow-up visits as told by your health care provider. This is important. Contact a health care provider if: Your medicine is not relieving your pain. You have a rash. You feel depressed. Get help right away if: Seek medical care right away if you are taking pain medicines and you (or people close to you) notice any of the following: Difficulty breathing. Breathing that is slower or more shallow than normal. Confusion. Sleepiness or difficulty staying awake. Nausea and vomiting. Your skin or lips turning pale or bluish in color. Tongue swelling. If you ever feel like you may hurt yourself or others, or have thoughts about taking your own life, get help right away. You can go to your nearest emergency department or call: Your local emergency services (911 in the [place] A suicide crisis helpline,  such as the National Suicide Prevention Lifeline at 1-800-273-8255. This is open 24 hours a day. Summary It is important to follow instructions from your health care provider when you are taking prescription pain medicines. Pain medicine can help reduce pain but may also cause side effects. Make sure you understand the risks and benefits of taking pain medicine for your condition. Take steps to lower your risk of problems by taking medicine exactly as told, restricting activity, keeping others safe, preventing constipation, and having a pain treatment plan. This information is not intended to replace advice given to you by your health care provider. Make sure you discuss any questions you have with your healthcare provider. Document Revised: 01/19/2020 Document Reviewed: 01/19/2020 Elsevier Patient Education  2022 [employer]vier Inc[employer]  

## 2020-11-23 NOTE — Progress Notes (Signed)
Subjective:   By signing my name below, I, Zite Okoli, attest that this documentation has been prepared under the direction and in the presence of Donato Schultz, DO 11/23/2020   Patient ID: Jonathan Hansen, male    DOB: 06-01-1975, 45 y.o.   MRN: [id number]  Chief Complaint  Patient presents with   Follow-up    HPI Patient is in today for an office visit and a 3 month f/u for chronic pain.  He mentions he has been experiencing right shoulder pain and is hesitant on getting shoulder surgery because he has heard bad things about the recovery.--  he wants to wait to see someone about his shoulder  He also mentions he is getting ready to go on a new diet.  Past Medical History:  Diagnosis Date   Arthritis    Back pain    Chest pain    Chronic knee pain    Depression    Dyspnea    Elevated liver enzymes    Fatty liver    HLD (hyperlipidemia)    HTN (hypertension)    Joint pain    Leg edema    Osteoarthritis    Pre-diabetes    Vitamin D deficiency     Past Surgical History:  Procedure Laterality Date   KNEE ARTHROSCOPY Right    VASECTOMY      Family History  Problem Relation Age of Onset   COPD Mother    Lung cancer Mother    Stroke Father    Coronary artery disease Father        carotid stenosis   Diabetes Father    Diabetes Paternal Grandfather     Social History   Socioeconomic History   Marital status: Married    Spouse name: April Messing   Number of children: 1   Years of education: Not on file   Highest education level: Not on file  Occupational History   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  Tobacco Use   Smoking status: Every Day    Packs/day: 1.00    Years: 25.00    Pack years: 25.00    Types: Cigarettes, E-cigarettes   Smokeless tobacco: Never   Tobacco comments:    now vape no cigarettes  Substance and Sexual Activity   Alcohol use: Not Currently    Comment: nothing in months 06/22/2018   Drug use: Not on file   Sexual activity:  Not on file  Other Topics Concern   Not on file  Social History Narrative   Not on file   Social Determinants of Health   Financial Resource Strain: Not on file  Food Insecurity: Not on file  Transportation Needs: Not on file  Physical Activity: Not on file  Stress: Not on file  Social Connections: Not on file  Intimate Partner Violence: Not on file    Outpatient Medications Prior to Visit  Medication Sig Dispense Refill   COVID-19 mRNA vaccine, Moderna, 100 MCG/0.5ML injection INJECT AS DIRECTED .25 mL 0   HYDROcodone-acetaminophen (NORCO/VICODIN) 5-325 MG tablet Take 1 tablet by mouth every 6 (six) hours as needed for moderate pain. 45 tablet 0   sertraline (ZOLOFT) 50 MG tablet Take 1 tablet (50 mg total) by mouth daily. 90 tablet 3   No facility-administered medications prior to visit.    Allergies  Allergen Reactions   Ibuprofen Other (See Comments)    Bleeding   Penicillins     Review of Systems  Constitutional:  Negative for chills, fever  and malaise/fatigue.  HENT:  Negative for congestion and hearing loss.   Eyes:  Negative for discharge.  Respiratory:  Negative for cough, sputum production and shortness of breath.   Cardiovascular:  Negative for chest pain, palpitations and leg swelling.  Gastrointestinal:  Negative for abdominal pain, blood in stool, constipation, diarrhea, heartburn, nausea and vomiting.  Genitourinary:  Negative for dysuria, frequency, hematuria and urgency.  Musculoskeletal:  Positive for joint pain (right shoulder). Negative for back pain, falls and myalgias.  Skin:  Negative for rash.  Neurological:  Negative for dizziness, sensory change, loss of consciousness, weakness and headaches.  Endo/Heme/Allergies:  Negative for environmental allergies. Does not bruise/bleed easily.  Psychiatric/Behavioral:  Negative for depression and suicidal ideas. The patient is not nervous/anxious and does not have insomnia.       Objective:    Physical  Exam Vitals and nursing note reviewed.  Constitutional:      General: He is not in acute distress.    Appearance: Normal appearance. He is not ill-appearing.  HENT:     Head: Normocephalic and atraumatic.     Right Ear: Tympanic membrane, ear canal and external ear normal.     Left Ear: Tympanic membrane, ear canal and external ear normal.  Eyes:     Pupils: Pupils are equal, round, and reactive to light.  Cardiovascular:     Rate and Rhythm: Normal rate and regular rhythm.     Pulses: Normal pulses.     Heart sounds: No murmur heard.   No gallop.  Pulmonary:     Effort: Pulmonary effort is normal. No respiratory distress.     Breath sounds: Normal breath sounds. No wheezing or rhonchi.  Abdominal:     General: Bowel sounds are normal. There is no distension.     Palpations: Abdomen is soft.     Tenderness: There is no abdominal tenderness. There is no guarding.     Hernia: No hernia is present.  Musculoskeletal:     Cervical back: Neck supple.  Lymphadenopathy:     Cervical: No cervical adenopathy.  Skin:    General: Skin is warm and dry.  Neurological:     Mental Status: He is alert and oriented to person, place, and time.  Psychiatric:        Mood and Affect: Mood normal.        Behavior: Behavior normal.        Thought Content: Thought content normal.    BP (!) 152/88 (BP Location: Left Arm, Patient Position: Sitting, Cuff Size: Large)   Temp (!) 81 F (27.2 C)   Ht 5' 7.5" (1.715 m)   Wt 281 lb (127.5 kg)   SpO2 97%   BMI 43.36 kg/m  Wt Readings from Last 3 Encounters:  11/23/20 281 lb (127.5 kg)  08/23/20 273 lb (123.8 kg)  07/06/20 275 lb (124.7 kg)    Diabetic Foot Exam - Simple   No data filed    Lab Results  Component Value Date   WBC 6.4 05/19/2019   HGB 15.1 05/19/2019   HCT 44.4 05/19/2019   PLT 226.0 05/19/2019   GLUCOSE 98 05/27/2019   CHOL 132 05/19/2019   TRIG 161.0 (H) 05/19/2019   HDL 41.80 05/19/2019   LDLDIRECT 46.0 05/19/2017    LDLCALC 58 05/19/2019   ALT 29 05/27/2019   AST 20 05/27/2019   NA 136 05/27/2019   K 3.8 05/27/2019   CL 101 05/27/2019   CREATININE 0.94 05/27/2019   BUN  16 05/27/2019   CO2 27 05/27/2019   TSH 1.570 08/13/2017   INR 1.2 (H) 05/19/2019   HGBA1C 5.4 05/19/2019    Lab Results  Component Value Date   TSH 1.570 08/13/2017   Lab Results  Component Value Date   WBC 6.4 05/19/2019   HGB 15.1 05/19/2019   HCT 44.4 05/19/2019   MCV 89.5 05/19/2019   PLT 226.0 05/19/2019   Lab Results  Component Value Date   NA 136 05/27/2019   K 3.8 05/27/2019   CO2 27 05/27/2019   GLUCOSE 98 05/27/2019   BUN 16 05/27/2019   CREATININE 0.94 05/27/2019   BILITOT 0.6 05/27/2019   ALKPHOS 42 05/27/2019   AST 20 05/27/2019   ALT 29 05/27/2019   PROT 7.7 05/27/2019   ALBUMIN 4.4 05/27/2019   CALCIUM 9.6 05/27/2019   GFR 86.38 05/27/2019   Lab Results  Component Value Date   CHOL 132 05/19/2019   Lab Results  Component Value Date   HDL 41.80 05/19/2019   Lab Results  Component Value Date   LDLCALC 58 05/19/2019   Lab Results  Component Value Date   TRIG 161.0 (H) 05/19/2019   Lab Results  Component Value Date   CHOLHDL 3 05/19/2019   Lab Results  Component Value Date   HGBA1C 5.4 05/19/2019       Assessment & Plan:   Problem List Items Addressed This Visit       Unprioritized   Chronic pain of both knees - Primary   Relevant Orders   DRUG MONITORING, PANEL 8 WITH CONFIRMATION, URINE           Pt does not need refill right now  Bp slightly elevated but pt is in a lot of pain today  No orders of the defined types were placed in this encounter.   I,Zite Okoli,acting as a Neurosurgeon for [employer], DO.,have documented all relevant documentation on the behalf of Donato Schultz, DO,as directed by  Donato Schultz, DO while in the presence of Donato Schultz, DO.   I, Donato Schultz, DO , personally preformed the services described in this  documentation.  All medical record entries made by the scribe were at my direction and in my presence.  I have reviewed the chart and discharge instructions (if applicable) and agree that the record reflects my personal performance and is accurate and complete. 11/23/2020

## 2020-11-27 LAB — DM TEMPLATE

## 2020-11-27 LAB — DRUG MONITORING, PANEL 8 WITH CONFIRMATION, URINE
6 Acetylmorphine: NEGATIVE ng/mL (ref <10)
Alcohol Metabolites: NEGATIVE ng/mL (ref <500)
Amphetamines: NEGATIVE ng/mL (ref <500)
Benzodiazepines: NEGATIVE ng/mL (ref <100)
Buprenorphine, Urine: NEGATIVE ng/mL (ref <5)
Cocaine Metabolite: NEGATIVE ng/mL (ref <150)
Codeine: NEGATIVE ng/mL (ref <50)
Creatinine: 38.3 mg/dL (ref >=20.0)
Hydrocodone: 108 ng/mL — ABNORMAL HIGH (ref <50)
Hydromorphone: NEGATIVE ng/mL (ref <50)
MDMA: NEGATIVE ng/mL (ref <500)
Marijuana Metabolite: NEGATIVE ng/mL (ref <20)
Morphine: NEGATIVE ng/mL (ref <50)
Norhydrocodone: 92 ng/mL — ABNORMAL HIGH (ref <50)
Opiates: POSITIVE ng/mL — AB (ref <100)
Oxidant: NEGATIVE ug/mL (ref <200)
Oxycodone: NEGATIVE ng/mL (ref <100)
pH: 5.7 (ref 4.5–9.0)

## 2020-12-12 ENCOUNTER — Other Ambulatory Visit: Payer: Self-pay | Admitting: Family Medicine

## 2020-12-12 DIAGNOSIS — M25561 Pain in right knee: Secondary | ICD-10-CM

## 2020-12-12 DIAGNOSIS — G8929 Other chronic pain: Secondary | ICD-10-CM

## 2020-12-12 NOTE — Telephone Encounter (Signed)
Requesting: hydrocodone 5-325mg  Contract: 11/23/2020 UDS: 11/23/2020 Last Visit: 11/23/2020 Next Visit: 01/10/2021 Last Refill: 11/14/2020 #45 and 0RF  Please Advise

## 2020-12-13 MED ORDER — HYDROCODONE-ACETAMINOPHEN 5-325 MG PO TABS: 1.0000 | ORAL_TABLET | Freq: Four times a day (QID) | ORAL | 0 refills | Status: DC | PRN

## 2021-01-10 ENCOUNTER — Other Ambulatory Visit: Payer: Self-pay

## 2021-01-10 ENCOUNTER — Telehealth (INDEPENDENT_AMBULATORY_CARE_PROVIDER_SITE_OTHER): Payer: 59 | Admitting: Family Medicine

## 2021-01-10 ENCOUNTER — Encounter: Payer: Self-pay | Admitting: Family Medicine

## 2021-01-10 DIAGNOSIS — M25561 Pain in right knee: Secondary | ICD-10-CM | POA: Diagnosis not present

## 2021-01-10 DIAGNOSIS — G8929 Other chronic pain: Secondary | ICD-10-CM

## 2021-01-10 DIAGNOSIS — M25562 Pain in left knee: Secondary | ICD-10-CM | POA: Diagnosis not present

## 2021-01-10 MED ORDER — HYDROCODONE-ACETAMINOPHEN 5-325 MG PO TABS: 1.0000 | ORAL_TABLET | Freq: Four times a day (QID) | ORAL | 0 refills | Status: DC | PRN

## 2021-01-10 NOTE — Progress Notes (Addendum)
MyChart Video Visit    Virtual Visit via Video Note   This visit type was conducted due to national recommendations for restrictions regarding the COVID-19 Pandemic (e.g. social distancing) in an effort to limit this patient's exposure and mitigate transmission in our community. This patient is at least at moderate risk for complications without adequate follow up. This format is felt to be most appropriate for this patient at this time. Physical exam was limited by quality of the video and audio technology used for the visit. Jonathan Hansen  was able to get the patient set up on a video visit.  Patient location: Home Patient and provider in visit Provider location: Office  I discussed the limitations of evaluation and management by telemedicine and the availability of in person appointments. The patient expressed understanding and agreed to proceed.  Visit Date: 01/10/2021  Today's healthcare provider: Donato Schultz, DO     Subjective:    Patient ID: Jonathan Hansen, male    DOB: 1975/12/08, 44 y.o.   MRN: [id number]  No chief complaint on file.   HPI Patient is in today for a video visit.   He reports that his anxiety is controlled since last visit. He stopped taking Zoloft 50 mg. He is asking for a refill today for his 5-325 mg hydrocodone-acetaminophen PRN PO. He has no acute complain today. He is willing to get Covid-19 new booster vaccine at his next office visit.    Past Medical History:  Diagnosis Date   Arthritis    Back pain    Chest pain    Chronic knee pain    Depression    Dyspnea    Elevated liver enzymes    Fatty liver    HLD (hyperlipidemia)    HTN (hypertension)    Joint pain    Leg edema    Osteoarthritis    Pre-diabetes    Vitamin D deficiency     Past Surgical History:  Procedure Laterality Date   KNEE ARTHROSCOPY Right    VASECTOMY      Family History  Problem Relation Age of Onset   COPD Mother    Lung cancer Mother     Stroke Father    Coronary artery disease Father        carotid stenosis   Diabetes Father    Diabetes Paternal Grandfather     Social History   Socioeconomic History   Marital status: Married    Spouse name: Jonathan Hansen   Number of children: 1   Years of education: Not on file   Highest education level: Not on file  Occupational History   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  Tobacco Use   Smoking status: Every Day    Packs/day: 1.00    Years: 25.00    Pack years: 25.00    Types: Cigarettes, E-cigarettes   Smokeless tobacco: Never   Tobacco comments:    now vape no cigarettes  Substance and Sexual Activity   Alcohol use: Not Currently    Comment: nothing in months 06/22/2018   Drug use: Not on file   Sexual activity: Not on file  Other Topics Concern   Not on file  Social History Narrative   Not on file   Social Determinants of Health   Financial Resource Strain: Not on file  Food Insecurity: Not on file  Transportation Needs: Not on file  Physical Activity: Not on file  Stress: Not on file  Social Connections: Not on file  Intimate Partner Violence: Not on file    Outpatient Medications Prior to Visit  Medication Sig Dispense Refill   COVID-19 mRNA vaccine, Moderna, 100 MCG/0.5ML injection INJECT AS DIRECTED .25 mL 0   HYDROcodone-acetaminophen (NORCO/VICODIN) 5-325 MG tablet Take 1 tablet by mouth every 6 (six) hours as needed for moderate pain. 45 tablet 0   sertraline (ZOLOFT) 50 MG tablet Take 1 tablet (50 mg total) by mouth daily. 90 tablet 3   No facility-administered medications prior to visit.    Allergies  Allergen Reactions   Ibuprofen Other (See Comments)    Bleeding   Penicillins     Review of Systems  Constitutional:  Negative for fever and malaise/fatigue.  HENT:  Negative for congestion.   Eyes:  Negative for blurred vision.  Respiratory:  Negative for cough and shortness of breath.   Cardiovascular:  Negative for chest pain,  palpitations and leg swelling.  Gastrointestinal:  Negative for abdominal pain, blood in stool, nausea and vomiting.  Genitourinary:  Negative for dysuria and frequency.  Musculoskeletal:  Negative for back pain and falls.  Skin:  Negative for rash.  Neurological:  Negative for dizziness, loss of consciousness and headaches.  Endo/Heme/Allergies:  Negative for environmental allergies.  Psychiatric/Behavioral:  Negative for depression. The patient is not nervous/anxious.       Objective:    Physical Exam Vitals and nursing note reviewed.  Constitutional:      Appearance: Normal appearance.  HENT:     Head: Normocephalic and atraumatic.  Neurological:     Mental Status: He is alert.  Psychiatric:        Behavior: Behavior normal.        Judgment: Judgment normal.    There were no vitals taken for this visit. Wt Readings from Last 3 Encounters:  11/23/20 281 lb (127.5 kg)  08/23/20 273 lb (123.8 kg)  07/06/20 275 lb (124.7 kg)    Diabetic Foot Exam - Simple   No data filed    Lab Results  Component Value Date   WBC 6.4 05/19/2019   HGB 15.1 05/19/2019   HCT 44.4 05/19/2019   PLT 226.0 05/19/2019   GLUCOSE 98 05/27/2019   CHOL 132 05/19/2019   TRIG 161.0 (H) 05/19/2019   HDL 41.80 05/19/2019   LDLDIRECT 46.0 05/19/2017   LDLCALC 58 05/19/2019   ALT 29 05/27/2019   AST 20 05/27/2019   NA 136 05/27/2019   K 3.8 05/27/2019   CL 101 05/27/2019   CREATININE 0.94 05/27/2019   BUN 16 05/27/2019   CO2 27 05/27/2019   TSH 1.570 08/13/2017   INR 1.2 (H) 05/19/2019   HGBA1C 5.4 05/19/2019    Lab Results  Component Value Date   TSH 1.570 08/13/2017   Lab Results  Component Value Date   WBC 6.4 05/19/2019   HGB 15.1 05/19/2019   HCT 44.4 05/19/2019   MCV 89.5 05/19/2019   PLT 226.0 05/19/2019   Lab Results  Component Value Date   NA 136 05/27/2019   K 3.8 05/27/2019   CO2 27 05/27/2019   GLUCOSE 98 05/27/2019   BUN 16 05/27/2019   CREATININE 0.94  05/27/2019   BILITOT 0.6 05/27/2019   ALKPHOS 42 05/27/2019   AST 20 05/27/2019   ALT 29 05/27/2019   PROT 7.7 05/27/2019   ALBUMIN 4.4 05/27/2019   CALCIUM 9.6 05/27/2019   GFR 86.38 05/27/2019   Lab Results  Component Value Date   CHOL 132 05/19/2019   Lab Results  Component Value Date   HDL 41.80 05/19/2019   Lab Results  Component Value Date   LDLCALC 58 05/19/2019   Lab Results  Component Value Date   TRIG 161.0 (H) 05/19/2019   Lab Results  Component Value Date   CHOLHDL 3 05/19/2019   Lab Results  Component Value Date   HGBA1C 5.4 05/19/2019       Assessment & Plan:   Problem List Items Addressed This Visit       Unprioritized   Chronic pain of both knees   Relevant Medications   HYDROcodone-acetaminophen (NORCO/VICODIN) 5-325 MG tablet  Uds / contract utd Database reviewed     Meds ordered this encounter  Medications   HYDROcodone-acetaminophen (NORCO/VICODIN) 5-325 MG tablet    Sig: Take 1 tablet by mouth every 6 (six) hours as needed for moderate pain.    Dispense:  45 tablet    Refill:  0    I discussed the assessment and treatment plan with the patient. The patient was provided an opportunity to ask questions and all were answered. The patient agreed with the plan and demonstrated an understanding of the instructions.   The patient was advised to call back or seek an in-person evaluation if the symptoms worsen or if the condition fails to improve as anticipated.  I provided 20 minutes of face-to-face time during this encounter.   I,Savera Zaman,acting as a Neurosurgeon for [employer], DO.,have documented all relevant documentation on the behalf of Donato Schultz, DO,as directed by  Donato Schultz, DO while in the presence of Donato Schultz, DO.   Donato Schultz, DO [facility] [phone] (phone) [phone] (fax)  [facility]

## 2021-02-05 ENCOUNTER — Other Ambulatory Visit: Payer: Self-pay | Admitting: Family Medicine

## 2021-02-05 DIAGNOSIS — M25562 Pain in left knee: Secondary | ICD-10-CM

## 2021-02-05 DIAGNOSIS — G8929 Other chronic pain: Secondary | ICD-10-CM

## 2021-02-05 MED ORDER — HYDROCODONE-ACETAMINOPHEN 5-325 MG PO TABS: 1.0000 | ORAL_TABLET | Freq: Four times a day (QID) | ORAL | 0 refills | Status: DC | PRN

## 2021-02-05 NOTE — Telephone Encounter (Signed)
Requesting: hydrocodone 5-325mg  Contract: 11/23/2020 UDS: 11/23/2020 Last Visit: 01/10/2021  Next Visit: 03/01/2021 Last Refill: 01/10/2021 #45 and 0RF   Please Advise

## 2021-03-01 ENCOUNTER — Ambulatory Visit: Payer: 59 | Admitting: Family Medicine

## 2021-03-01 ENCOUNTER — Other Ambulatory Visit: Payer: Self-pay

## 2021-03-01 ENCOUNTER — Encounter: Payer: Self-pay | Admitting: Family Medicine

## 2021-03-01 VITALS — BP 148/98 | HR 79 | Temp 97.8°F | Resp 18 | Ht 67.5 in | Wt 286.4 lb

## 2021-03-01 DIAGNOSIS — M170 Bilateral primary osteoarthritis of knee: Secondary | ICD-10-CM

## 2021-03-01 DIAGNOSIS — G8929 Other chronic pain: Secondary | ICD-10-CM

## 2021-03-01 DIAGNOSIS — M25561 Pain in right knee: Secondary | ICD-10-CM

## 2021-03-01 DIAGNOSIS — M25562 Pain in left knee: Secondary | ICD-10-CM

## 2021-03-01 DIAGNOSIS — M25511 Pain in right shoulder: Secondary | ICD-10-CM | POA: Diagnosis not present

## 2021-03-01 DIAGNOSIS — M25512 Pain in left shoulder: Secondary | ICD-10-CM

## 2021-03-01 MED ORDER — HYDROCODONE-ACETAMINOPHEN 5-325 MG PO TABS: 1.0000 | ORAL_TABLET | Freq: Four times a day (QID) | ORAL | 0 refills | Status: DC | PRN

## 2021-03-01 NOTE — Progress Notes (Addendum)
Subjective:   By signing my name below, I, Zite Okoli, attest that this documentation has been prepared under the direction and in the presence of  [name] DO. 03/01/2021      Patient ID: Jonathan Hansen, male    DOB: Sep 15, 1975, 44 y.o.   MRN: [id number]  Chief Complaint  Patient presents with   Pain   Follow-up    HPI Patient is in today for an office visit.  He reports that when he raises his right arm, his shoulder and the rest of his arm goes numb for 10 minutes. He also mentions that the pain in his shoulders is becoming worse with the right shoulder being worse than the left shoulder.  He is requesting for a refill on hydrocodone-acetaminophen.  He is not interested in the flu vaccine.  Past Medical History:  Diagnosis Date   Arthritis    Back pain    Chest pain    Chronic knee pain    Depression    Dyspnea    Elevated liver enzymes    Fatty liver    HLD (hyperlipidemia)    HTN (hypertension)    Joint pain    Leg edema    Osteoarthritis    Pre-diabetes    Vitamin D deficiency     Past Surgical History:  Procedure Laterality Date   KNEE ARTHROSCOPY Right    VASECTOMY      Family History  Problem Relation Age of Onset   COPD Mother    Lung cancer Mother    Stroke Father    Coronary artery disease Father        carotid stenosis   Diabetes Father    Diabetes Paternal Grandfather     Social History   Socioeconomic History   Marital status: Married    Spouse name: April Hogan   Number of children: 1   Years of education: Not on file   Highest education level: Not on file  Occupational History   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  Tobacco Use   Smoking status: Every Day    Packs/day: 1.00    Years: 25.00    Pack years: 25.00    Types: Cigarettes, E-cigarettes   Smokeless tobacco: Never   Tobacco comments:    now vape no cigarettes  Substance and Sexual Activity   Alcohol use: Not Currently    Comment: nothing in months  06/22/2018   Drug use: Not on file   Sexual activity: Not on file  Other Topics Concern   Not on file  Social History Narrative   Not on file   Social Determinants of Health   Financial Resource Strain: Not on file  Food Insecurity: Not on file  Transportation Needs: Not on file  Physical Activity: Not on file  Stress: Not on file  Social Connections: Not on file  Intimate Partner Violence: Not on file    Outpatient Medications Prior to Visit  Medication Sig Dispense Refill   HYDROcodone-acetaminophen (NORCO/VICODIN) 5-325 MG tablet Take 1 tablet by mouth every 6 (six) hours as needed for moderate pain. 45 tablet 0   COVID-19 mRNA vaccine, Moderna, 100 MCG/0.5ML injection INJECT AS DIRECTED (Patient not taking: Reported on 03/01/2021) .25 mL 0   No facility-administered medications prior to visit.    Allergies  Allergen Reactions   Ibuprofen Other (See Comments)    Bleeding   Penicillins     Review of Systems  Constitutional:  Negative for fever.  HENT:  Negative for congestion, ear pain, hearing loss, sinus pain and sore throat.   Eyes:  Negative for blurred vision and pain.  Respiratory:  Negative for cough, sputum production, shortness of breath and wheezing.   Cardiovascular:  Negative for chest pain and palpitations.  Gastrointestinal:  Negative for blood in stool, constipation, diarrhea, nausea and vomiting.  Genitourinary:  Negative for dysuria, frequency, hematuria and urgency.  Musculoskeletal:  Positive for joint pain (shoulders bilateral). Negative for back pain, falls and myalgias.  Neurological:  Negative for dizziness, sensory change, loss of consciousness, weakness and headaches.  Endo/Heme/Allergies:  Negative for environmental allergies. Does not bruise/bleed easily.  Psychiatric/Behavioral:  Negative for depression and suicidal ideas. The patient is not nervous/anxious and does not have insomnia.       Objective:    Physical Exam Constitutional:       General: He is not in acute distress.    Appearance: Normal appearance. He is not ill-appearing.  HENT:     Head: Normocephalic and atraumatic.     Right Ear: Tympanic membrane, ear canal and external ear normal.     Left Ear: Tympanic membrane, ear canal and external ear normal.  Eyes:     Pupils: Pupils are equal, round, and reactive to light.  Cardiovascular:     Rate and Rhythm: Normal rate and regular rhythm.     Pulses: Normal pulses.     Heart sounds: No murmur heard.   No gallop.  Pulmonary:     Effort: Pulmonary effort is normal. No respiratory distress.     Breath sounds: Normal breath sounds. No wheezing or rhonchi.  Abdominal:     General: Bowel sounds are normal. There is no distension.     Palpations: Abdomen is soft.     Tenderness: There is no abdominal tenderness. There is no guarding.     Hernia: No hernia is present.  Musculoskeletal:     Cervical back: Neck supple.  Lymphadenopathy:     Cervical: No cervical adenopathy.  Skin:    General: Skin is warm and dry.  Neurological:     Mental Status: He is alert and oriented to person, place, and time.    BP (!) 148/98 (BP Location: Left Arm, Patient Position: Sitting, Cuff Size: Normal)   Pulse 79   Temp 97.8 F (36.6 C) (Oral)   Resp 18   Ht 5' 7.5" (1.715 m)   Wt 286 lb 6.4 oz (129.9 kg)   SpO2 97%   BMI 44.19 kg/m  Wt Readings from Last 3 Encounters:  03/01/21 286 lb 6.4 oz (129.9 kg)  11/23/20 281 lb (127.5 kg)  08/23/20 273 lb (123.8 kg)    Diabetic Foot Exam - Simple   No data filed    Lab Results  Component Value Date   WBC 6.4 05/19/2019   HGB 15.1 05/19/2019   HCT 44.4 05/19/2019   PLT 226.0 05/19/2019   GLUCOSE 98 05/27/2019   CHOL 132 05/19/2019   TRIG 161.0 (H) 05/19/2019   HDL 41.80 05/19/2019   LDLDIRECT 46.0 05/19/2017   LDLCALC 58 05/19/2019   ALT 29 05/27/2019   AST 20 05/27/2019   NA 136 05/27/2019   K 3.8 05/27/2019   CL 101 05/27/2019   CREATININE 0.94 05/27/2019    BUN 16 05/27/2019   CO2 27 05/27/2019   TSH 1.570 08/13/2017   INR 1.2 (H) 05/19/2019   HGBA1C 5.4 05/19/2019    Lab Results  Component Value Date   TSH 1.570  08/13/2017   Lab Results  Component Value Date   WBC 6.4 05/19/2019   HGB 15.1 05/19/2019   HCT 44.4 05/19/2019   MCV 89.5 05/19/2019   PLT 226.0 05/19/2019   Lab Results  Component Value Date   NA 136 05/27/2019   K 3.8 05/27/2019   CO2 27 05/27/2019   GLUCOSE 98 05/27/2019   BUN 16 05/27/2019   CREATININE 0.94 05/27/2019   BILITOT 0.6 05/27/2019   ALKPHOS 42 05/27/2019   AST 20 05/27/2019   ALT 29 05/27/2019   PROT 7.7 05/27/2019   ALBUMIN 4.4 05/27/2019   CALCIUM 9.6 05/27/2019   GFR 86.38 05/27/2019   Lab Results  Component Value Date   CHOL 132 05/19/2019   Lab Results  Component Value Date   HDL 41.80 05/19/2019   Lab Results  Component Value Date   LDLCALC 58 05/19/2019   Lab Results  Component Value Date   TRIG 161.0 (H) 05/19/2019   Lab Results  Component Value Date   CHOLHDL 3 05/19/2019   Lab Results  Component Value Date   HGBA1C 5.4 05/19/2019       Assessment & Plan:   Problem List Items Addressed This Visit       Unprioritized   Chronic pain of both knees   Relevant Medications   HYDROcodone-acetaminophen (NORCO/VICODIN) 5-325 MG tablet   Chronic pain of both shoulders - Primary    Refer to ortho Refill pain meds      Relevant Medications   HYDROcodone-acetaminophen (NORCO/VICODIN) 5-325 MG tablet   Other Relevant Orders   Ambulatory referral to Orthopedic Surgery   Primary osteoarthritis of both knees    S/p surgery b/l       Relevant Medications   HYDROcodone-acetaminophen (NORCO/VICODIN) 5-325 MG tablet    Meds ordered this encounter  Medications   HYDROcodone-acetaminophen (NORCO/VICODIN) 5-325 MG tablet    Sig: Take 1 tablet by mouth every 6 (six) hours as needed for moderate pain.    Dispense:  45 tablet    Refill:  0    I,Zite Okoli,acting  as a scribe for [employer], DO.,have documented all relevant documentation on the behalf of Ann Held, DO,as directed by  Ann Held, DO while in the presence of Ann Held, DO.   I,  Ann Held DO., personally preformed the services described in this documentation.  All medical record entries made by the scribe were at my direction and in my presence.  I have reviewed the chart and discharge instructions (if applicable) and agree that the record reflects my personal performance and is accurate and complete. 03/01/2021

## 2021-03-01 NOTE — Assessment & Plan Note (Signed)
S/p surgery b/l

## 2021-03-01 NOTE — Assessment & Plan Note (Addendum)
Refer to ortho Refill pain meds

## 2021-03-01 NOTE — Patient Instructions (Signed)

## 2021-03-21 ENCOUNTER — Other Ambulatory Visit: Payer: Self-pay | Admitting: Family Medicine

## 2021-03-21 DIAGNOSIS — G8929 Other chronic pain: Secondary | ICD-10-CM

## 2021-03-21 DIAGNOSIS — M25561 Pain in right knee: Secondary | ICD-10-CM

## 2021-03-21 MED ORDER — HYDROCODONE-ACETAMINOPHEN 5-325 MG PO TABS: 1.0000 | ORAL_TABLET | Freq: Four times a day (QID) | ORAL | 0 refills | Status: DC | PRN

## 2021-03-21 NOTE — Telephone Encounter (Signed)
Requesting: hydrocodone Contract: 05/14/20 UDS: 05/14/20 Last Visit: 03/01/21 Next Visit: 06/07/21 Last Refill: 03/01/21  Please Advise

## 2021-04-16 ENCOUNTER — Other Ambulatory Visit: Payer: Self-pay | Admitting: Family Medicine

## 2021-04-16 DIAGNOSIS — G8929 Other chronic pain: Secondary | ICD-10-CM

## 2021-04-17 NOTE — Telephone Encounter (Signed)
Requesting: NORCO Contract: 11/23/2020 UDS: 11/23/2020 Last OV:  03/01/2021 Next OV: 06/07/2021 Last Refill: 03/21/21, #45--0 RF Database:   Please advise

## 2021-04-17 NOTE — Telephone Encounter (Signed)
pt called to check on med refill. please advise.

## 2021-04-18 NOTE — Telephone Encounter (Signed)
pt called again regarding med refill, pt stated he needs meds before this weekend. please advise.

## 2021-04-19 ENCOUNTER — Other Ambulatory Visit: Payer: Self-pay | Admitting: Family Medicine

## 2021-04-19 DIAGNOSIS — M25562 Pain in left knee: Secondary | ICD-10-CM

## 2021-04-19 MED ORDER — HYDROCODONE-ACETAMINOPHEN 5-325 MG PO TABS: 1.0000 | ORAL_TABLET | Freq: Four times a day (QID) | ORAL | 0 refills | Status: DC | PRN

## 2021-04-19 NOTE — Telephone Encounter (Signed)
Medication: HYDROcodone-acetaminophen (NORCO/VICODIN) 5-325 MG tablet  Has the patient contacted their pharmacy? No.   Preferred Pharmacy: [id number], [place] - [address]  [address] [place] [address]  Phone:  [phone]  Fax:  [phone]

## 2021-04-19 NOTE — Telephone Encounter (Signed)
Requesting: NORCO Contract: 05/14/2020 UDS: 05/14/2020 Last OV: 03/01/2021 Next OV: 06/07/2021 Last Refill: 03/21/2021, #45-0 RF Database:   Please advise

## 2021-05-14 ENCOUNTER — Other Ambulatory Visit: Payer: Self-pay | Admitting: Family Medicine

## 2021-05-14 DIAGNOSIS — G8929 Other chronic pain: Secondary | ICD-10-CM

## 2021-05-14 NOTE — Telephone Encounter (Signed)
Requesting: Norco/Vicodin 5-325mg  Contract: 05/14/2020 UDS: 03/01/2021 Last Visit: 03/01/2021 Next Visit: 06/07/2021 Last Refill: 04/19/2021, #45 X 0RF  Please Advise

## 2021-05-15 ENCOUNTER — Other Ambulatory Visit: Payer: Self-pay | Admitting: Family Medicine

## 2021-05-15 DIAGNOSIS — M25562 Pain in left knee: Secondary | ICD-10-CM

## 2021-05-15 MED ORDER — HYDROCODONE-ACETAMINOPHEN 5-325 MG PO TABS: 1.0000 | ORAL_TABLET | Freq: Four times a day (QID) | ORAL | 0 refills | Status: DC | PRN

## 2021-06-07 ENCOUNTER — Other Ambulatory Visit: Payer: Self-pay | Admitting: Family Medicine

## 2021-06-07 ENCOUNTER — Ambulatory Visit: Payer: 59 | Admitting: Family Medicine

## 2021-06-07 ENCOUNTER — Telehealth: Payer: Self-pay

## 2021-06-07 ENCOUNTER — Encounter: Payer: Self-pay | Admitting: Family Medicine

## 2021-06-07 VITALS — BP 134/78 | HR 91 | Temp 97.8°F | Resp 18 | Ht 67.5 in | Wt 284.4 lb

## 2021-06-07 DIAGNOSIS — M25512 Pain in left shoulder: Secondary | ICD-10-CM

## 2021-06-07 DIAGNOSIS — G8929 Other chronic pain: Secondary | ICD-10-CM

## 2021-06-07 DIAGNOSIS — M25561 Pain in right knee: Secondary | ICD-10-CM | POA: Diagnosis not present

## 2021-06-07 DIAGNOSIS — Z79899 Other long term (current) drug therapy: Secondary | ICD-10-CM

## 2021-06-07 DIAGNOSIS — F419 Anxiety disorder, unspecified: Secondary | ICD-10-CM | POA: Diagnosis not present

## 2021-06-07 DIAGNOSIS — M25562 Pain in left knee: Secondary | ICD-10-CM

## 2021-06-07 DIAGNOSIS — M25511 Pain in right shoulder: Secondary | ICD-10-CM | POA: Diagnosis not present

## 2021-06-07 MED ORDER — HYDROCODONE-ACETAMINOPHEN 5-325 MG PO TABS: 1.0000 | ORAL_TABLET | Freq: Four times a day (QID) | ORAL | 0 refills | Status: DC | PRN

## 2021-06-07 MED ORDER — SERTRALINE HCL 50 MG PO TABS: 50.0000 mg | ORAL_TABLET | Freq: Every day | ORAL | 3 refills | Status: DC

## 2021-06-07 NOTE — Assessment & Plan Note (Signed)
Restart zoloft F/u 3 months

## 2021-06-07 NOTE — Assessment & Plan Note (Signed)
uds updated  °Refill meds °F/u ortho °

## 2021-06-07 NOTE — Progress Notes (Signed)
Subjective:   By signing my name below, I, Zite Okoli, attest that this documentation has been prepared under the direction and in the presence of Ann Held, DO. 06/07/2021     Patient ID: Jonathan Hansen, male    DOB: 06/23/1975, 46 y.o.   MRN: [id number]  Chief Complaint  Patient presents with   Pain   Follow-up    HPI Patient is in today for an office visit and 3 month f/u.  He reports his anxiety is returning. He had an episode about a month ago,his blood pressure was high and he had to restart 50 mg Zoloft. He has been using it consistently since then. He needs a refill on Zoloft.  He reports his right knee and shoulders have been bothering him and he just wants to monitor it before going to see an orthopedic surgeon.   Past Medical History:  Diagnosis Date   Arthritis    Back pain    Chest pain    Chronic knee pain    Depression    Dyspnea    Elevated liver enzymes    Fatty liver    HLD (hyperlipidemia)    HTN (hypertension)    Joint pain    Leg edema    Osteoarthritis    Pre-diabetes    Vitamin D deficiency     Past Surgical History:  Procedure Laterality Date   KNEE ARTHROSCOPY Right    VASECTOMY      Family History  Problem Relation Age of Onset   COPD Mother    Lung cancer Mother    Stroke Father    Coronary artery disease Father        carotid stenosis   Diabetes Father    Diabetes Paternal Grandfather     Social History   Socioeconomic History   Marital status: Married    Spouse name: April Copenhaver   Number of children: 1   Years of education: Not on file   Highest education level: Not on file  Occupational History   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  Tobacco Use   Smoking status: Every Day    Packs/day: 1.00    Years: 25.00    Pack years: 25.00    Types: Cigarettes, E-cigarettes   Smokeless tobacco: Never   Tobacco comments:    now vape no cigarettes  Substance and Sexual Activity   Alcohol use: Not Currently     Comment: nothing in months 06/22/2018   Drug use: Not on file   Sexual activity: Not on file  Other Topics Concern   Not on file  Social History Narrative   Not on file   Social Determinants of Health   Financial Resource Strain: Not on file  Food Insecurity: Not on file  Transportation Needs: Not on file  Physical Activity: Not on file  Stress: Not on file  Social Connections: Not on file  Intimate Partner Violence: Not on file    Outpatient Medications Prior to Visit  Medication Sig Dispense Refill   HYDROcodone-acetaminophen (NORCO/VICODIN) 5-325 MG tablet Take 1 tablet by mouth every 6 (six) hours as needed for moderate pain. 45 tablet 0   No facility-administered medications prior to visit.    Allergies  Allergen Reactions   Ibuprofen Other (See Comments)    Bleeding   Penicillins     Review of Systems  Constitutional:  Negative for fever.  HENT:  Negative for congestion, ear pain, hearing loss, sinus pain and sore throat.  Eyes:  Negative for blurred vision and pain.  Respiratory:  Negative for cough, sputum production, shortness of breath and wheezing.   Cardiovascular:  Negative for chest pain and palpitations.  Gastrointestinal:  Negative for blood in stool, constipation, diarrhea, nausea and vomiting.  Genitourinary:  Negative for dysuria, frequency, hematuria and urgency.  Musculoskeletal:  Positive for joint pain (knees,shoulders). Negative for back pain, falls and myalgias.  Neurological:  Negative for dizziness, sensory change, loss of consciousness, weakness and headaches.  Endo/Heme/Allergies:  Negative for environmental allergies. Does not bruise/bleed easily.  Psychiatric/Behavioral:  Negative for depression and suicidal ideas. The patient is nervous/anxious. The patient does not have insomnia.       Objective:    Physical Exam Constitutional:      General: He is not in acute distress.    Appearance: Normal appearance. He is not ill-appearing.   HENT:     Head: Normocephalic and atraumatic.     Right Ear: Tympanic membrane, ear canal and external ear normal.     Left Ear: Tympanic membrane, ear canal and external ear normal.  Eyes:     Pupils: Pupils are equal, round, and reactive to light.  Cardiovascular:     Rate and Rhythm: Normal rate and regular rhythm.     Pulses: Normal pulses.     Heart sounds: No murmur heard.   No gallop.  Pulmonary:     Effort: Pulmonary effort is normal. No respiratory distress.     Breath sounds: Normal breath sounds. No wheezing or rhonchi.  Abdominal:     General: Bowel sounds are normal. There is no distension.     Palpations: Abdomen is soft.     Tenderness: There is no abdominal tenderness. There is no guarding.     Hernia: No hernia is present.  Musculoskeletal:     Cervical back: Neck supple.  Lymphadenopathy:     Cervical: No cervical adenopathy.  Skin:    General: Skin is warm and dry.  Neurological:     Mental Status: He is alert and oriented to person, place, and time.    BP 134/78 (BP Location: Left Arm, Patient Position: Sitting, Cuff Size: Large)    Pulse 91    Temp 97.8 F (36.6 C) (Oral)    Resp 18    Ht 5' 7.5" (1.715 m)    Wt 284 lb 6.4 oz (129 kg)    SpO2 97%    BMI 43.89 kg/m  Wt Readings from Last 3 Encounters:  06/07/21 284 lb 6.4 oz (129 kg)  03/01/21 286 lb 6.4 oz (129.9 kg)  11/23/20 281 lb (127.5 kg)    Diabetic Foot Exam - Simple   No data filed    Lab Results  Component Value Date   WBC 6.4 05/19/2019   HGB 15.1 05/19/2019   HCT 44.4 05/19/2019   PLT 226.0 05/19/2019   GLUCOSE 98 05/27/2019   CHOL 132 05/19/2019   TRIG 161.0 (H) 05/19/2019   HDL 41.80 05/19/2019   LDLDIRECT 46.0 05/19/2017   LDLCALC 58 05/19/2019   ALT 29 05/27/2019   AST 20 05/27/2019   NA 136 05/27/2019   K 3.8 05/27/2019   CL 101 05/27/2019   CREATININE 0.94 05/27/2019   BUN 16 05/27/2019   CO2 27 05/27/2019   TSH 1.570 08/13/2017   INR 1.2 (H) 05/19/2019   HGBA1C  5.4 05/19/2019    Lab Results  Component Value Date   TSH 1.570 08/13/2017   Lab Results  Component  Value Date   WBC 6.4 05/19/2019   HGB 15.1 05/19/2019   HCT 44.4 05/19/2019   MCV 89.5 05/19/2019   PLT 226.0 05/19/2019   Lab Results  Component Value Date   NA 136 05/27/2019   K 3.8 05/27/2019   CO2 27 05/27/2019   GLUCOSE 98 05/27/2019   BUN 16 05/27/2019   CREATININE 0.94 05/27/2019   BILITOT 0.6 05/27/2019   ALKPHOS 42 05/27/2019   AST 20 05/27/2019   ALT 29 05/27/2019   PROT 7.7 05/27/2019   ALBUMIN 4.4 05/27/2019   CALCIUM 9.6 05/27/2019   GFR 86.38 05/27/2019   Lab Results  Component Value Date   CHOL 132 05/19/2019   Lab Results  Component Value Date   HDL 41.80 05/19/2019   Lab Results  Component Value Date   LDLCALC 58 05/19/2019   Lab Results  Component Value Date   TRIG 161.0 (H) 05/19/2019   Lab Results  Component Value Date   CHOLHDL 3 05/19/2019   Lab Results  Component Value Date   HGBA1C 5.4 05/19/2019       Assessment & Plan:   Problem List Items Addressed This Visit       Unprioritized   Anxiety    Restart zoloft F/u 3 months       Relevant Medications   sertraline (ZOLOFT) 50 MG tablet   Chronic pain of both knees    uds updated  Refill meds F/u ortho      Relevant Medications   sertraline (ZOLOFT) 50 MG tablet   Chronic pain of both shoulders    uds updated  Refill meds F/u ortho      Relevant Medications   sertraline (ZOLOFT) 50 MG tablet   Other Visit Diagnoses     High risk medication use    -  Primary   Relevant Orders   Drug Monitoring Panel [phone] , Urine       Meds ordered this encounter  Medications   sertraline (ZOLOFT) 50 MG tablet    Sig: Take 1 tablet (50 mg total) by mouth daily.    Dispense:  90 tablet    Refill:  3    I,Zite Okoli,acting as a Education administrator for [employer], DO.,have documented all relevant documentation on the behalf of Ann Held, DO,as directed by   Ann Held, DO while in the presence of Ann Held, DO.   I, Ann Held, DO. , personally preformed the services described in this documentation.  All medical record entries made by the scribe were at my direction and in my presence.  I have reviewed the chart and discharge instructions (if applicable) and agree that the record reflects my personal performance and is accurate and complete. 06/07/2021

## 2021-06-07 NOTE — Assessment & Plan Note (Signed)
uds updated  Refill meds F/u ortho

## 2021-06-07 NOTE — Telephone Encounter (Signed)
Requesting: Norco 5-325 MG Contract: 05/14/20 UDS: 11/23/20 Last Visit:06/07/20 Next Visit:09/06/21 Last Refill:05/15/21, #45, 0 refills  Please Advise

## 2021-06-07 NOTE — Telephone Encounter (Signed)
Pt states he went to the pharmacy after his appointment and his [facility] wasn't at the pharmacy , was unsure if he was supposed to get a new script today

## 2021-06-07 NOTE — Patient Instructions (Signed)

## 2021-06-09 LAB — DM TEMPLATE

## 2021-06-10 LAB — DRUG MONITORING PANEL 376104, URINE
Amphetamines: NEGATIVE ng/mL (ref <500)
Barbiturates: NEGATIVE ng/mL (ref <300)
Benzodiazepines: NEGATIVE ng/mL (ref <100)
Cocaine Metabolite: NEGATIVE ng/mL (ref <150)
Codeine: NEGATIVE ng/mL (ref <50)
Desmethyltramadol: NEGATIVE ng/mL (ref <100)
Hydrocodone: 68 ng/mL — ABNORMAL HIGH (ref <50)
Hydromorphone: NEGATIVE ng/mL (ref <50)
Morphine: NEGATIVE ng/mL (ref <50)
Norhydrocodone: 57 ng/mL — ABNORMAL HIGH (ref <50)
Opiates: POSITIVE ng/mL — AB (ref <100)
Oxycodone: NEGATIVE ng/mL (ref <100)
Tramadol: NEGATIVE ng/mL (ref <100)

## 2021-06-10 LAB — DM TEMPLATE

## 2021-06-14 ENCOUNTER — Telehealth: Payer: Self-pay

## 2021-06-14 ENCOUNTER — Other Ambulatory Visit: Payer: Self-pay | Admitting: Family Medicine

## 2021-06-14 ENCOUNTER — Other Ambulatory Visit (HOSPITAL_BASED_OUTPATIENT_CLINIC_OR_DEPARTMENT_OTHER): Payer: Self-pay

## 2021-06-14 ENCOUNTER — Encounter: Payer: Self-pay | Admitting: Family Medicine

## 2021-06-14 DIAGNOSIS — G8929 Other chronic pain: Secondary | ICD-10-CM

## 2021-06-14 MED ORDER — HYDROCODONE-ACETAMINOPHEN 5-325 MG PO TABS
1.0000 | ORAL_TABLET | Freq: Four times a day (QID) | ORAL | 0 refills | Status: DC | PRN
  Filled 2021-06-14: qty 28, 7d supply, fill #0
  Filled 2021-07-02: qty 17, 5d supply, fill #1

## 2021-06-14 NOTE — Telephone Encounter (Signed)
Pt chart message already sent to [facility]

## 2021-06-14 NOTE — Telephone Encounter (Signed)
See telephone note.

## 2021-06-14 NOTE — Telephone Encounter (Signed)
Who Is Calling Patient / Member / Family / Caregiver Call Type Triage / Clinical Relationship To Patient Self Return Phone Number [phone] (Primary) Chief Complaint Pain - Generalized Reason for Call Medication Question / Request Initial Comment Caller states he went into see the doctor last week and they refiled his pain medicine. [facility] will not have it until a week from tomorrow, 06/14/2021, He wants to know if it can be called in somewhere else. Caller states he is in pain from not having his medication. Translation No Nurse Assessment Nurse: Files, RN, Dustin Date/Time (Eastern Time): 06/13/2021 6:07:36 PM Confirm and document reason for call. If symptomatic, describe symptoms. ---[facility], said they have the 10mg , no new or worsening, arthritis pain. Does the patient have any new or worsening symptoms? ---No Please document clinical information provided and list any resource used. ---Let caller know to call the clinic in the am to get his medication sent in. Disp. Time Time) Disposition Final User 06/13/2021 6:10:32 PM Clinical Call Yes Files, RN, 06/15/2021

## 2021-06-14 NOTE — Telephone Encounter (Signed)
Patient is calling to get an update on his medication. He states that [facility] [place] [facility], he could come pick it up from here instead. He would like a call back with an update. Please advise.

## 2021-07-02 ENCOUNTER — Other Ambulatory Visit (HOSPITAL_BASED_OUTPATIENT_CLINIC_OR_DEPARTMENT_OTHER): Payer: Self-pay

## 2021-07-16 ENCOUNTER — Other Ambulatory Visit: Payer: Self-pay | Admitting: Family Medicine

## 2021-07-16 DIAGNOSIS — G8929 Other chronic pain: Secondary | ICD-10-CM

## 2021-07-17 ENCOUNTER — Other Ambulatory Visit (HOSPITAL_BASED_OUTPATIENT_CLINIC_OR_DEPARTMENT_OTHER): Payer: Self-pay

## 2021-07-17 MED ORDER — HYDROCODONE-ACETAMINOPHEN 5-325 MG PO TABS
1.0000 | ORAL_TABLET | Freq: Four times a day (QID) | ORAL | 0 refills | Status: DC | PRN
  Filled 2021-07-17: qty 45, 12d supply, fill #0

## 2021-07-17 NOTE — Telephone Encounter (Signed)
Requesting: NORCO ?Contract: 06/07/2021 ?UDS: 06/07/2021 ?Last OV: 06/07/2021 ?Next OV: 09/06/2021 ?Last Refill: 06/14/2021, #[id number] RF ?Database: ? ? ?Please advise  ? ?

## 2021-08-12 ENCOUNTER — Other Ambulatory Visit: Payer: Self-pay | Admitting: Family Medicine

## 2021-08-12 DIAGNOSIS — G8929 Other chronic pain: Secondary | ICD-10-CM

## 2021-08-13 ENCOUNTER — Other Ambulatory Visit (HOSPITAL_BASED_OUTPATIENT_CLINIC_OR_DEPARTMENT_OTHER): Payer: Self-pay

## 2021-08-13 MED ORDER — HYDROCODONE-ACETAMINOPHEN 5-325 MG PO TABS
1.0000 | ORAL_TABLET | Freq: Four times a day (QID) | ORAL | 0 refills | Status: DC | PRN
  Filled 2021-08-13: qty 45, 12d supply, fill #0

## 2021-08-13 NOTE — Telephone Encounter (Signed)
Requesting:Norco/vicodin 5-325mg  ?Contract:06/07/21 ?UDS:06/07/21 ?Last Visit:06/07/21 ?Next Visit:09/06/21 ?Last Refill:07/17/21 ? ?Please Advise  ?

## 2021-08-19 ENCOUNTER — Telehealth: Payer: 59 | Admitting: Family Medicine

## 2021-09-06 ENCOUNTER — Encounter: Payer: Self-pay | Admitting: Family Medicine

## 2021-09-06 ENCOUNTER — Other Ambulatory Visit (HOSPITAL_BASED_OUTPATIENT_CLINIC_OR_DEPARTMENT_OTHER): Payer: Self-pay

## 2021-09-06 ENCOUNTER — Ambulatory Visit: Payer: 59 | Admitting: Family Medicine

## 2021-09-06 DIAGNOSIS — M25562 Pain in left knee: Secondary | ICD-10-CM | POA: Diagnosis not present

## 2021-09-06 DIAGNOSIS — M25561 Pain in right knee: Secondary | ICD-10-CM

## 2021-09-06 DIAGNOSIS — G8929 Other chronic pain: Secondary | ICD-10-CM | POA: Diagnosis not present

## 2021-09-06 MED ORDER — HYDROCODONE-ACETAMINOPHEN 5-325 MG PO TABS
1.0000 | ORAL_TABLET | Freq: Four times a day (QID) | ORAL | 0 refills | Status: DC | PRN
  Filled 2021-09-06: qty 45, 12d supply, fill #0

## 2021-09-06 NOTE — Progress Notes (Signed)
Established Patient Office Visit  Subjective   Patient ID: Jonathan Hansen, male    DOB: 09-19-1975  Age: 46 y.o. MRN: [id number]  Chief Complaint  Patient presents with   Pain   Follow-up    HPI Pt here to f/u pain meds .   His knees are still giving him trouble --- s/p TKR b/l  No other complaints  Patient Active Problem List   Diagnosis Date Noted   Chronic pain of both shoulders 03/01/2021   Anxiety 07/06/2020   Dysuria 11/08/2019   Primary osteoarthritis of both knees 07/06/2019   HLD (hyperlipidemia) 06/01/2018   Morbid obesity ([place]) 05/19/2017   Essential hypertension 02/17/2016   Chronic pain of both knees 02/17/2016   Chest pain 02/17/2016   Past Medical History:  Diagnosis Date   Arthritis    Back pain    Chest pain    Chronic knee pain    Depression    Dyspnea    Elevated liver enzymes    Fatty liver    HLD (hyperlipidemia)    HTN (hypertension)    Joint pain    Leg edema    Osteoarthritis    Pre-diabetes    Vitamin D deficiency    Past Surgical History:  Procedure Laterality Date   KNEE ARTHROSCOPY Right    VASECTOMY     Social History   Tobacco Use   Smoking status: Every Day    Packs/day: 1.00    Years: 25.00    Pack years: 25.00    Types: Cigarettes, E-cigarettes   Smokeless tobacco: Never   Tobacco comments:    now vape no cigarettes  Substance Use Topics   Alcohol use: Not Currently    Comment: nothing in months 06/22/2018   Social History   Socioeconomic History   Marital status: Married    Spouse name: April Friedly   Number of children: 1   Years of education: Not on file   Highest education level: Not on file  Occupational History   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  Tobacco Use   Smoking status: Every Day    Packs/day: 1.00    Years: 25.00    Pack years: 25.00    Types: Cigarettes, E-cigarettes   Smokeless tobacco: Never   Tobacco comments:    now vape no cigarettes  Substance and Sexual Activity   Alcohol use:  Not Currently    Comment: nothing in months 06/22/2018   Drug use: Not on file   Sexual activity: Not on file  Other Topics Concern   Not on file  Social History Narrative   Not on file   Social Determinants of Health   Financial Resource Strain: Not on file  Food Insecurity: Not on file  Transportation Needs: Not on file  Physical Activity: Not on file  Stress: Not on file  Social Connections: Not on file  Intimate Partner Violence: Not on file   Family Status  Relation Name Status   Mother  Alive   Father  Deceased at age 81   PGF  Deceased at age 59       MI,  bone cancer   Mat Uncle  Deceased at age 41       MI   Mat Uncle  Deceased at age 71       MI   Family History  Problem Relation Age of Onset   COPD Mother    Lung cancer Mother    Stroke Father    Coronary  artery disease Father        carotid stenosis   Diabetes Father    Diabetes Paternal Grandfather    Allergies  Allergen Reactions   Ibuprofen Other (See Comments)    Bleeding   Penicillins       Review of Systems  Constitutional:  Negative for fever and malaise/fatigue.  HENT:  Negative for congestion.   Eyes:  Negative for blurred vision.  Respiratory:  Negative for cough and shortness of breath.   Cardiovascular:  Negative for chest pain, palpitations and leg swelling.  Gastrointestinal:  Negative for vomiting.  Musculoskeletal:  Positive for joint pain and myalgias. Negative for back pain.  Skin:  Negative for rash.  Neurological:  Negative for loss of consciousness and headaches.     Objective:     BP 120/78 (BP Location: Left Arm, Patient Position: Sitting, Cuff Size: Large)   Pulse 78   Temp 98.6 F (37 C) (Oral)   Resp 18   Ht 5' 7.5" (1.715 m)   Wt 273 lb 9.6 oz (124.1 kg)   SpO2 96%   BMI 42.22 kg/m  BP Readings from Last 3 Encounters:  09/06/21 120/78  06/07/21 134/78  03/01/21 (!) 148/98   Wt Readings from Last 3 Encounters:  09/06/21 273 lb 9.6 oz (124.1 kg)   06/07/21 284 lb 6.4 oz (129 kg)  03/01/21 286 lb 6.4 oz (129.9 kg)   SpO2 Readings from Last 3 Encounters:  09/06/21 96%  06/07/21 97%  03/01/21 97%      Physical Exam Vitals and nursing note reviewed.  Constitutional:      Appearance: He is well-developed.  HENT:     Head: Normocephalic and atraumatic.  Eyes:     Pupils: Pupils are equal, round, and reactive to light.  Neck:     Thyroid: No thyromegaly.  Cardiovascular:     Rate and Rhythm: Normal rate and regular rhythm.     Heart sounds: No murmur heard. Pulmonary:     Effort: Pulmonary effort is normal. No respiratory distress.     Breath sounds: Normal breath sounds. No wheezing or rales.  Chest:     Chest wall: No tenderness.  Musculoskeletal:        General: No tenderness.     Cervical back: Normal range of motion and neck supple.  Skin:    General: Skin is warm and dry.  Neurological:     Mental Status: He is alert and oriented to person, place, and time.  Psychiatric:        Behavior: Behavior normal.        Thought Content: Thought content normal.        Judgment: Judgment normal.     No results found for any visits on 09/06/21.    The 10-year ASCVD risk score (Arnett DK, et al., 2019) is: 3.9%    Assessment & Plan:   Problem List Items Addressed This Visit       Unprioritized   Chronic pain of both knees    datatbase reviewed  uds and contract utd meds renewed  rto 3 months        Relevant Medications   HYDROcodone-acetaminophen (NORCO/VICODIN) 5-325 MG tablet    Return in about 3 months (around 12/07/2021), or if symptoms worsen or fail to improve, for annual exam, fasting.    Ann Held, DO

## 2021-09-06 NOTE — Assessment & Plan Note (Signed)
datatbase reviewed  uds and contract utd meds renewed  rto 3 months

## 2021-09-06 NOTE — Patient Instructions (Signed)
Pain Medicine Instructions You may need pain medicine after an injury or illness. There are many types of pain medicine. Two common types of pain medicine are: Non-opioid pain medicines. These include: Over-the-counter (OTC) medicines such as acetaminophen or non-steroidal anti-inflammatory medicines (NSAIDS). Prescription medicines such as gabapentin or pregabalin. Opioid prescription pain medicines. These include: Hydrocodone. Oxycodone. Tramadol. Pain medicine may be prescribed to relieve most or all of your pain. It may not be possible to make all of your pain go away, but you should be comfortable enough to move, breathe, and do normal activities. How can pain medicines affect me? Pain medicines can cause side effects such as: Nausea. Vomiting. Abdominal pain. Opioid pain medicines can cause additional side effects, such as: Constipation. Drowsiness. Confusion. Difficulty breathing (respiratory depression). Dependence and addiction (opioid use disorder). Using opioid pain medicines for longer than 3 days increases your risk of these side effects. Taking opioid pain medicine for a long period of time can affect your ability to do daily tasks. It also puts you at risk for: Motor vehicle accidents. Depression. Suicide. Heart attack. If they are not taken correctly, non-opioid and opioid medicines may also put you at risk for: Liver problems. Kidney problems. Overdose. This is taking too much of the medicine. It can sometimes lead to death. What actions can I take to lower my risk of problems? Know your pain treatment plan To manage your pain successfully, you and your health care provider need to understand each other and work together. To do this: Discuss the goals of your treatment, including how much pain you might expect to have and how you will manage the pain. Ask your health care provider to refer you to one or more specialists who can help you manage pain in other ways.  Some other ways of managing pain include physical therapy, exercise, massage, or biofeedback. Review the risks and benefits of taking pain medicines for your condition. Be honest about the amount of medicines you take, and about any drugs or alcohol you use. Get pain medicine prescriptions from only one health care provider. Keep all follow-up visits. This is important. Take your medicine as directed  Take your pain medicine exactly as told by your health care provider. Take it only when you need it. If your pain gets less severe, you may take less than your prescribed dose if your health care provider approves. If you are not having pain, do nottake pain medicine unless your health care provider tells you to take it. If your pain medicine contains acetaminophen, do not take any other acetaminophen while taking this medicine. Acetaminophen is found in many over-the-counter and prescription medicines. An overdose of acetaminophen can result in severe liver damage. If your pain is severe, do nottry to treat it yourself by taking more pills than instructed on your prescription. Contact your health care provider for help. Write down the times when you take your pain medicine. It is easy to become confused while on pain medicine. Writing the time can help you avoid overdose. Take other over-the-counter or prescription medicines only as told by your health care provider. Restrict your activity as directed While you are taking prescription pain medicine, and for 8 hours after your last dose, follow these instructions: Do not drive. Do not use machinery or power tools. Do not sign legal documents. Do not drink alcohol. Do not take sleeping pills. Do not supervise children by yourself. Do not do activities that require climbing or being in high places. Do   not go to a lake, river, ocean, spa, or swimming pool unless an adult is nearby who can monitor and help you.  Keep pets and people safe Keep pain  medicine in a locked cabinet, or in a secure area where children and pets cannot reach it. Never share your prescription pain medicine with anyone. Do not save any leftover pills. If you have leftover medicine, you can: Bring the medicine to a prescription take-back program. This is usually offered by the county or law enforcement. Bring it to a pharmacy that has a drug disposal container. Throw it out in the trash. Check the label or package insert of your medicine to see whether this is safe to do. If it is safe to throw it out, remove the medicine from the container and mix it with material that makes it unusable, such as pet waste, before putting it in the trash. Flush it down the toilet only if this is safe to do. To find out, check the label or package insert of your medicine. Information is also available at the [place] Food and Drug Administration website: fda.gov. Treat or prevent constipation Taking pain medicines increases your risk for constipation. To prevent or treat this problem: Drink enough water to keep your urine pale yellow. Take over-the-counter or prescription medicines. You may need to take stool softeners. Eat foods that are high in fiber, such as beans, whole grains, and fresh fruits and vegetables. Limit foods that are high in fat and processed sugars, such as fried or sweet foods. Contact a health care provider if: Your medicine is not relieving your pain. You have a rash. You have nausea and vomiting. You feel depressed. Get help right away if: You have difficulty breathing. Your breathing is slower or more shallow than normal. You feel confused. You are too sleepy or you have difficulty staying awake. Your skin or lips turn pale or bluish in color. Your tongue swells. You have thoughts of harming yourself or harming others. These symptoms may represent a serious problem that is an emergency. Do not wait to see if the symptoms will go away. Get medical help right  away. Call your local emergency services (911 in the [place] Do not drive yourself to the hospital. If you ever feel like you may hurt yourself or others, or have thoughts about taking your own life, get help right away. Go to your nearest emergency department or: Call your local emergency services (911 in the [place] Call the National Poison Control Center (1-800-222-1222 in the [place] Call a suicide crisis helpline, such as the National Suicide Prevention Lifeline at 1-800-273-8255 or 988 in the [place] This is open 24 hours a day in the [place] Text the Crisis Text Line at 741741 (in the [place] Summary It is important to follow instructions from your health care provider when you are taking prescription pain medicines. Pain medicine can help reduce pain but may also cause side effects. Make sure you understand the risks and benefits of taking pain medicine for your condition. Take steps to lower your risk of problems by taking medicine exactly as told, restricting activity, keeping others safe, preventing constipation, and having a pain treatment plan. This information is not intended to replace advice given to you by your health care provider. Make sure you discuss any questions you have with your health care provider. Document Revised: 10/31/2020 Document Reviewed: 08/15/2020 Elsevier Patient Education  2023 [employer] Inc.  

## 2021-09-30 ENCOUNTER — Other Ambulatory Visit: Payer: Self-pay | Admitting: Family Medicine

## 2021-09-30 DIAGNOSIS — G8929 Other chronic pain: Secondary | ICD-10-CM

## 2021-09-30 NOTE — Telephone Encounter (Signed)
Requesting: hydrocodone 5-325mg   Contract: 06/07/21 UDS: 06/07/21 Last Visit: 09/06/21  Next Visit: 12/10/21 Last Refill: 09/06/21 #45 and 0RF  Please Advise

## 2021-10-01 ENCOUNTER — Other Ambulatory Visit (HOSPITAL_BASED_OUTPATIENT_CLINIC_OR_DEPARTMENT_OTHER): Payer: Self-pay

## 2021-10-01 MED ORDER — HYDROCODONE-ACETAMINOPHEN 5-325 MG PO TABS
1.0000 | ORAL_TABLET | Freq: Four times a day (QID) | ORAL | 0 refills | Status: DC | PRN
  Filled 2021-10-01: qty 45, 12d supply, fill #0

## 2021-10-21 ENCOUNTER — Other Ambulatory Visit: Payer: Self-pay | Admitting: Family Medicine

## 2021-10-21 ENCOUNTER — Other Ambulatory Visit (HOSPITAL_BASED_OUTPATIENT_CLINIC_OR_DEPARTMENT_OTHER): Payer: Self-pay

## 2021-10-21 DIAGNOSIS — G8929 Other chronic pain: Secondary | ICD-10-CM

## 2021-10-21 MED ORDER — HYDROCODONE-ACETAMINOPHEN 5-325 MG PO TABS
1.0000 | ORAL_TABLET | Freq: Four times a day (QID) | ORAL | 0 refills | Status: DC | PRN
  Filled 2021-10-21: qty 45, 12d supply, fill #0

## 2021-10-21 NOTE — Telephone Encounter (Signed)
Requesting: hydrocodone 5-325mg   Contract: 06/07/21 UDS: 06/07/21 Last Visit: 09/06/21 Next Visit: 12/10/21 Last Refill: 10/01/21 #45 and 0RF  Please Advise

## 2021-10-29 ENCOUNTER — Encounter: Payer: Self-pay | Admitting: Medical

## 2021-10-29 ENCOUNTER — Ambulatory Visit: Payer: 59 | Admitting: Medical

## 2021-10-29 VITALS — BP 147/78 | HR 83 | Resp 18 | Ht 67.5 in | Wt 279.0 lb

## 2021-10-29 DIAGNOSIS — M25511 Pain in right shoulder: Secondary | ICD-10-CM | POA: Diagnosis not present

## 2021-10-29 DIAGNOSIS — G8929 Other chronic pain: Secondary | ICD-10-CM | POA: Diagnosis not present

## 2021-10-29 DIAGNOSIS — M25512 Pain in left shoulder: Secondary | ICD-10-CM | POA: Diagnosis not present

## 2021-10-29 NOTE — Progress Notes (Signed)
Subjective:    Patient ID: Jonathan Hansen, male    DOB: 10/14/1975, 46 y.o.   MRN: [id number]  HPI   Pt in for recent shoulder pain. Pt states he has had shoulder pain for several months. Pt states few weeks ago he had sharp excruciating pain working on refrigerator. Pt states he has severe pain to rt shoulder with any range of motion except if forearm close to side or near chest. He states low level pain for about 6 months but extreme sharp pain when he worked on refrigerator. He just reached and stretched felt rip sensation.  Also the other day getting down on floor felt sharp shoulder pain.  Pt has been on norco for pain. Pt had 45 tab rx.   Pt on contract and uds.  Pt states can't take nsaids.       Review of Systems  Constitutional:  Negative for chills, fatigue and fever.  Respiratory:  Negative for cough, choking, shortness of breath and wheezing.   Cardiovascular:  Negative for chest pain and palpitations.  Gastrointestinal:  Negative for abdominal pain, nausea and rectal pain.  Genitourinary:  Negative for dysuria.  Musculoskeletal:  Negative for back pain.  Neurological:  Negative for dizziness, speech difficulty, weakness, light-headedness and headaches.  Hematological:  Negative for adenopathy. Does not bruise/bleed easily.  Psychiatric/Behavioral:  Negative for behavioral problems and confusion.     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   Tobacco Use   Smoking status: Every Day    Packs/day: 1.00    Years: 25.00    Total pack years: 25.00    Types: Cigarettes, E-cigarettes   Smokeless tobacco: Never   Tobacco comments:    now vape no cigarettes  Substance and Sexual Activity   Alcohol use: Not Currently    Comment: nothing in months 06/22/2018   Drug use: Not on file   Sexual activity: Not on file  Other Topics Concern   Not on file  Social History Narrative   Not on file   Social Determinants of Health   Financial Resource Strain: Not on file  Food Insecurity: Not on file  Transportation Needs: Not on file  Physical Activity: Not on file  Stress: Not on file  Social Connections: Not on file  Intimate Partner Violence: Not on file    Past Surgical History:  Procedure Laterality Date   KNEE ARTHROSCOPY Right    VASECTOMY      Family History  Problem Relation Age of Onset   COPD Mother    Lung cancer Mother    Stroke Father    Coronary artery disease Father        carotid stenosis   Diabetes Father    Diabetes Paternal Actor  Allergies  Allergen Reactions   Ibuprofen Other (See Comments)    Bleeding   Penicillins     Current Outpatient Medications on File Prior to Visit  Medication Sig Dispense Refill   HYDROcodone-acetaminophen (NORCO/VICODIN) 5-325 MG tablet Take 1 tablet by mouth every 6 (six) hours as needed for moderate pain. 45 tablet 0   sertraline (ZOLOFT) 50 MG tablet Take 1 tablet (50 mg total) by mouth daily. 90 tablet 3   No current facility-administered medications on file prior to visit.    BP (!) 150/90   Pulse 83   Resp 18   Ht 5' 7.5" (1.715 m)   Wt 279 lb (126.6 kg)   SpO2 99%   BMI 43.05 kg/m        Objective:   Physical Exam  General- No acute distress. Pleasant patient. Neck- Full range of motion, no jvd Lungs- Clear, even and unlabored. Heart- regular rate and rhythm. Neurologic- CNII- XII grossly intact.  Rt shoulder- pain on palpation and  restricted rom due to pain. Left shoulder- mild pain on range of motion. But not restricted.        Assessment & Plan:   Patient Instructions  For bilateral shoulder pain will refer to Dr. Rennis Chris at [facility]. During the interim use norco if needed. If pain worsens or changes let us know pending ortho appt.  Htn-  mild high but this may be from pain. Continue to follow. Low salt diet and advise weight loss. Mayb try [employer]. In past report with weight loss bp better.  Follow up as regularly scheduled with pcp or sooner if needed/    Esperanza Richters, PA-C

## 2021-10-29 NOTE — Patient Instructions (Addendum)
For bilateral shoulder pain will refer to Dr. Rennis Chris at [facility]. During the interim use norco if needed. If pain worsens or changes let us know pending ortho appt.  Htn-  mild high but this may be from pain. Continue to follow. Low salt diet and advise weight loss. Mayb try [employer]. In past report with weight loss bp better.  Follow up as regularly scheduled with pcp or sooner if needed/

## 2021-11-04 ENCOUNTER — Other Ambulatory Visit (HOSPITAL_BASED_OUTPATIENT_CLINIC_OR_DEPARTMENT_OTHER): Payer: Self-pay

## 2021-11-04 ENCOUNTER — Other Ambulatory Visit: Payer: Self-pay | Admitting: Family Medicine

## 2021-11-04 DIAGNOSIS — G8929 Other chronic pain: Secondary | ICD-10-CM

## 2021-11-04 MED ORDER — HYDROCODONE-ACETAMINOPHEN 5-325 MG PO TABS
1.0000 | ORAL_TABLET | Freq: Four times a day (QID) | ORAL | 0 refills | Status: DC | PRN
  Filled 2021-11-04: qty 45, 12d supply, fill #0

## 2021-11-04 NOTE — Telephone Encounter (Signed)
Requesting:norco/vicodin 5-325 mg Contract:06/07/21 UDS:06/07/21 Last Visit:10/29/21 Next Visit:12/10/21 Last Refill:10/21/21  Please Advise

## 2021-11-05 ENCOUNTER — Other Ambulatory Visit (HOSPITAL_BASED_OUTPATIENT_CLINIC_OR_DEPARTMENT_OTHER): Payer: Self-pay

## 2021-11-19 ENCOUNTER — Other Ambulatory Visit: Payer: Self-pay | Admitting: Family Medicine

## 2021-11-19 DIAGNOSIS — G8929 Other chronic pain: Secondary | ICD-10-CM

## 2021-11-19 MED ORDER — HYDROCODONE-ACETAMINOPHEN 5-325 MG PO TABS
1.0000 | ORAL_TABLET | Freq: Four times a day (QID) | ORAL | 0 refills | Status: DC | PRN
  Filled 2021-11-19: qty 45, 12d supply, fill #0

## 2021-11-19 NOTE — Telephone Encounter (Signed)
Requesting:norco/vicodin 5-325 mg Contract:06/07/21 UDS:06/07/21 Last Visit:10/29/21 Next Visit:12/10/21 Last Refill:11/04/21  Please Advise

## 2021-11-20 ENCOUNTER — Other Ambulatory Visit (HOSPITAL_BASED_OUTPATIENT_CLINIC_OR_DEPARTMENT_OTHER): Payer: Self-pay

## 2021-12-03 ENCOUNTER — Other Ambulatory Visit: Payer: Self-pay | Admitting: Family Medicine

## 2021-12-03 ENCOUNTER — Other Ambulatory Visit (HOSPITAL_BASED_OUTPATIENT_CLINIC_OR_DEPARTMENT_OTHER): Payer: Self-pay

## 2021-12-03 DIAGNOSIS — G8929 Other chronic pain: Secondary | ICD-10-CM

## 2021-12-03 MED ORDER — HYDROCODONE-ACETAMINOPHEN 5-325 MG PO TABS
1.0000 | ORAL_TABLET | Freq: Four times a day (QID) | ORAL | 0 refills | Status: DC | PRN
  Filled 2021-12-03: qty 45, 12d supply, fill #0

## 2021-12-03 NOTE — Telephone Encounter (Signed)
Requesting:norco/vicodin 5-325 mg Contract:06/07/21 UDS:06/07/21 Last Visit:10/29/21 Next Visit:12/10/21 Last Refill:11/19/21  Please Advise

## 2021-12-10 ENCOUNTER — Encounter: Payer: Self-pay | Admitting: Family Medicine

## 2021-12-10 ENCOUNTER — Ambulatory Visit (INDEPENDENT_AMBULATORY_CARE_PROVIDER_SITE_OTHER): Payer: 59 | Admitting: Family Medicine

## 2021-12-10 VITALS — BP 138/88 | HR 81 | Temp 98.6°F | Resp 18 | Ht 67.5 in | Wt 285.8 lb

## 2021-12-10 DIAGNOSIS — M25561 Pain in right knee: Secondary | ICD-10-CM

## 2021-12-10 DIAGNOSIS — E785 Hyperlipidemia, unspecified: Secondary | ICD-10-CM | POA: Diagnosis not present

## 2021-12-10 DIAGNOSIS — Z125 Encounter for screening for malignant neoplasm of prostate: Secondary | ICD-10-CM

## 2021-12-10 DIAGNOSIS — Z1159 Encounter for screening for other viral diseases: Secondary | ICD-10-CM

## 2021-12-10 DIAGNOSIS — M25512 Pain in left shoulder: Secondary | ICD-10-CM

## 2021-12-10 DIAGNOSIS — I10 Essential (primary) hypertension: Secondary | ICD-10-CM

## 2021-12-10 DIAGNOSIS — Z1211 Encounter for screening for malignant neoplasm of colon: Secondary | ICD-10-CM | POA: Diagnosis not present

## 2021-12-10 DIAGNOSIS — M25562 Pain in left knee: Secondary | ICD-10-CM

## 2021-12-10 DIAGNOSIS — G8929 Other chronic pain: Secondary | ICD-10-CM

## 2021-12-10 DIAGNOSIS — M25511 Pain in right shoulder: Secondary | ICD-10-CM

## 2021-12-10 DIAGNOSIS — Z0000 Encounter for general adult medical examination without abnormal findings: Secondary | ICD-10-CM

## 2021-12-10 NOTE — Patient Instructions (Signed)

## 2021-12-10 NOTE — Progress Notes (Unsigned)
Established Patient Office Visit  Subjective   Patient ID: Jonathan Hansen, male    DOB: 01-May-1975  Age: 46 y.o. MRN: [id number]  Chief Complaint  Patient presents with  . Annual Exam    HPI Pt here for cpe.  No complaints   Patient Active Problem List   Diagnosis Date Noted  . Preventative health care 12/11/2021  . Chronic pain of both shoulders 03/01/2021  . Anxiety 07/06/2020  . Dysuria 11/08/2019  . Primary osteoarthritis of both knees 07/06/2019  . HLD (hyperlipidemia) 06/01/2018  . Morbid obesity (HCC) 05/19/2017  . Essential hypertension 02/17/2016  . Chronic pain of both knees 02/17/2016  . Chest pain 02/17/2016   Past Medical History:  Diagnosis Date  . Arthritis   . Back pain   . Chest pain   . Chronic knee pain   . Depression   . Dyspnea   . Elevated liver enzymes   . Fatty liver   . HLD (hyperlipidemia)   . HTN (hypertension)   . Joint pain   . Leg edema   . Osteoarthritis   . Pre-diabetes   . Vitamin D deficiency    Past Surgical History:  Procedure Laterality Date  . KNEE ARTHROSCOPY Right   . VASECTOMY     Social History   Tobacco Use  . Smoking status: Former    Years: 25.00    Types: Cigarettes  . Smokeless tobacco: Never  . Tobacco comments:    now vape no cigarettes  Vaping Use  . Vaping Use: Every day  . Substances: Nicotine  Substance Use Topics  . Alcohol use: Not Currently    Comment: nothing in months 06/22/2018  . Drug use: Never   Social History   Socioeconomic History  . Marital status: Married    Spouse name: April Boutin  . Number of children: 1  . Years of education: Not on file  . Highest education level: Not on file  Occupational History  . 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 Smoking status: Former    Years: 25.00    Types: Cigarettes  . Smokeless tobacco: Never  . Tobacco comments:    now vape no cigarettes  Vaping Use  . Vaping Use: Every day  . Substances: Nicotine  Substance and  Sexual Activity  . Alcohol use: Not Currently    Comment: nothing in months 06/22/2018  . Drug use: Never  . Sexual activity: Yes    Partners: Female  Other Topics Concern  . Not on file  Social History Narrative  . Not on file   Social Determinants of Health   Financial Resource Strain: Not on file  Food Insecurity: Not on file  Transportation Needs: Not on file  Physical Activity: Not on file  Stress: Not on file  Social Connections: Not on file  Intimate Partner Violence: Not on file   Family Status  Relation Name Status  . Mother  Alive  . Father  Deceased at age 68  . PGF  Deceased at age 29       MI,  bone cancer  . Mat Uncle  Deceased at age 12       MI  . Mat Uncle  Deceased at age 69       MI   Family History  Problem Relation Age of Onset  . COPD Mother   . Lung cancer Mother   . Stroke Father   . Coronary artery disease Father  carotid stenosis  . Diabetes Father   . Diabetes Paternal Grandfather    Allergies  Allergen Reactions  . Ibuprofen Other (See Comments)    Bleeding  . Penicillins       Review of Systems  Constitutional:  Negative for fever and malaise/fatigue.  HENT:  Negative for congestion.   Eyes:  Negative for blurred vision.  Respiratory:  Negative for cough and shortness of breath.   Cardiovascular:  Negative for chest pain, palpitations and leg swelling.  Gastrointestinal:  Negative for abdominal pain, blood in stool, nausea and vomiting.  Genitourinary:  Negative for dysuria and frequency.  Musculoskeletal:  Negative for back pain and falls.  Skin:  Negative for rash.  Neurological:  Negative for dizziness, loss of consciousness and headaches.  Endo/Heme/Allergies:  Negative for environmental allergies.  Psychiatric/Behavioral:  Negative for depression. The patient is not nervous/anxious.      Objective:     BP 138/88 (BP Location: Left Arm, Patient Position: Sitting, Cuff Size: Large)   Pulse 81   Temp 98.6 F (37  C) (Oral)   Resp 18   Ht 5' 7.5" (1.715 m)   Wt 285 lb 12.8 oz (129.6 kg)   SpO2 95%   BMI 44.10 kg/m  BP Readings from Last 3 Encounters:  12/10/21 138/88  10/29/21 (!) 147/78  09/06/21 120/78   Wt Readings from Last 3 Encounters:  12/10/21 285 lb 12.8 oz (129.6 kg)  10/29/21 279 lb (126.6 kg)  09/06/21 273 lb 9.6 oz (124.1 kg)   SpO2 Readings from Last 3 Encounters:  12/10/21 95%  10/29/21 99%  09/06/21 96%      Physical Exam Vitals and nursing note reviewed.  Constitutional:      General: He is not in acute distress.    Appearance: He is well-developed. He is not diaphoretic.  HENT:     Head: Normocephalic and atraumatic.     Right Ear: External ear normal.     Left Ear: External ear normal.     Nose: Nose normal.     Mouth/Throat:     Pharynx: No oropharyngeal exudate.  Eyes:     General:        Right eye: No discharge.        Left eye: No discharge.     Conjunctiva/sclera: Conjunctivae normal.     Pupils: Pupils are equal, round, and reactive to light.  Neck:     Thyroid: No thyromegaly.     Vascular: No JVD.  Cardiovascular:     Rate and Rhythm: Normal rate and regular rhythm.     Heart sounds: No murmur heard. Pulmonary:     Effort: Pulmonary effort is normal. No respiratory distress.     Breath sounds: Normal breath sounds. No wheezing or rales.  Chest:     Chest wall: No tenderness.  Abdominal:     General: Bowel sounds are normal. There is no distension.     Palpations: Abdomen is soft. There is no mass.     Tenderness: There is no abdominal tenderness. There is no guarding or rebound.  Musculoskeletal:        General: Normal range of motion.     Cervical back: Normal range of motion and neck supple.     Right hip: Tenderness present. Normal range of motion. Normal strength.     Left hip: Tenderness present. Normal range of motion. Normal strength.     Right foot: Bony tenderness present. No swelling.  Left foot: Bony tenderness present. No  swelling.  Lymphadenopathy:     Cervical: No cervical adenopathy.  Skin:    General: Skin is warm and dry.     Findings: No erythema or rash.  Neurological:     Mental Status: He is alert and oriented to person, place, and time.     Cranial Nerves: No cranial nerve deficit.     Motor: No abnormal muscle tone.     Deep Tendon Reflexes: Reflexes are normal and symmetric. Reflexes normal.  Psychiatric:        Behavior: Behavior normal.        Thought Content: Thought content normal.        Judgment: Judgment normal.    No results found for any visits on 12/10/21.  Last CBC Lab Results  Component Value Date   WBC 6.4 05/19/2019   HGB 15.1 05/19/2019   HCT 44.4 05/19/2019   MCV 89.5 05/19/2019   MCH 31.5 08/13/2017   RDW 12.8 05/19/2019   PLT 226.0 05/19/2019   Last metabolic panel Lab Results  Component Value Date   GLUCOSE 98 05/27/2019   NA 136 05/27/2019   K 3.8 05/27/2019   CL 101 05/27/2019   CO2 27 05/27/2019   BUN 16 05/27/2019   CREATININE 0.94 05/27/2019   GFRNONAA 106 01/12/2018   CALCIUM 9.6 05/27/2019   PROT 7.7 05/27/2019   ALBUMIN 4.4 05/27/2019   LABGLOB 2.6 01/12/2018   AGRATIO 1.7 01/12/2018   BILITOT 0.6 05/27/2019   ALKPHOS 42 05/27/2019   AST 20 05/27/2019   ALT 29 05/27/2019   Last lipids Lab Results  Component Value Date   CHOL 132 05/19/2019   HDL 41.80 05/19/2019   LDLCALC 58 05/19/2019   LDLDIRECT 46.0 05/19/2017   TRIG 161.0 (H) 05/19/2019   CHOLHDL 3 05/19/2019   Last hemoglobin A1c Lab Results  Component Value Date   HGBA1C 5.4 05/19/2019   Last thyroid functions Lab Results  Component Value Date   TSH 1.570 08/13/2017   T3TOTAL 105 08/13/2017   Last vitamin D Lab Results  Component Value Date   VD25OH 16.6 (L) 01/12/2018   Last vitamin B12 and Folate Lab Results  Component Value Date   VITAMINB12 525 08/13/2017   FOLATE >20.0 08/13/2017      The 10-year ASCVD risk score (Arnett DK, et al., 2019) is: 5%     Assessment & Plan:   Problem List Items Addressed This Visit       Unprioritized   HLD (hyperlipidemia)   Relevant Orders   Comprehensive metabolic panel   Lipid panel   Other Visit Diagnoses     Preventative health care    -  Primary   Relevant Orders   CBC with Differential/Platelet   Comprehensive metabolic panel   Lipid panel   TSH   PSA   Need for hepatitis C screening test       Relevant Orders   Hepatitis C antibody   Colon cancer screening       Relevant Orders   Ambulatory referral to Gastroenterology       No follow-ups on file.    Donato Schultz, DO

## 2021-12-11 DIAGNOSIS — Z0000 Encounter for general adult medical examination without abnormal findings: Secondary | ICD-10-CM | POA: Insufficient documentation

## 2021-12-11 LAB — LIPID PANEL
Cholesterol: 189 mg/dL (ref 0–200)
HDL: 47.8 mg/dL (ref >39.00)
NonHDL: 140.81
Total CHOL/HDL Ratio: 4
Triglycerides: 266 mg/dL — ABNORMAL HIGH (ref 0.0–149.0)
VLDL: 53.2 mg/dL — ABNORMAL HIGH (ref 0.0–40.0)

## 2021-12-11 LAB — COMPREHENSIVE METABOLIC PANEL
ALT: 37 U/L (ref 0–53)
AST: 24 U/L (ref 0–37)
Albumin: 4.4 g/dL (ref 3.5–5.2)
Alkaline Phosphatase: 49 U/L (ref 39–117)
BUN: 12 mg/dL (ref 6–23)
CO2: 27 mEq/L (ref 19–32)
Calcium: 9.4 mg/dL (ref 8.4–10.5)
Chloride: 100 mEq/L (ref 96–112)
Creatinine, Ser: 0.88 mg/dL (ref 0.40–1.50)
GFR: 101.62 mL/min (ref >60.00)
Glucose, Bld: 90 mg/dL (ref 70–99)
Potassium: 4 mEq/L (ref 3.5–5.1)
Sodium: 137 mEq/L (ref 135–145)
Total Bilirubin: 0.7 mg/dL (ref 0.2–1.2)
Total Protein: 7.6 g/dL (ref 6.0–8.3)

## 2021-12-11 LAB — CBC WITH DIFFERENTIAL/PLATELET
Basophils Absolute: 0.1 10*3/uL (ref 0.0–0.1)
Basophils Relative: 1.1 % (ref 0.0–3.0)
Eosinophils Absolute: 0.1 10*3/uL (ref 0.0–0.7)
Eosinophils Relative: 1.1 % (ref 0.0–5.0)
HCT: 45.1 % (ref 39.0–52.0)
Hemoglobin: 15.6 g/dL (ref 13.0–17.0)
Lymphocytes Relative: 33 % (ref 12.0–46.0)
Lymphs Abs: 2.1 10*3/uL (ref 0.7–4.0)
MCHC: 34.6 g/dL (ref 30.0–36.0)
MCV: 89.1 fl (ref 78.0–100.0)
Monocytes Absolute: 0.7 10*3/uL (ref 0.1–1.0)
Monocytes Relative: 11.4 % (ref 3.0–12.0)
Neutro Abs: 3.5 10*3/uL (ref 1.4–7.7)
Neutrophils Relative %: 53.4 % (ref 43.0–77.0)
Platelets: 217 10*3/uL (ref 150.0–400.0)
RBC: 5.06 Mil/uL (ref 4.22–5.81)
RDW: 14 % (ref 11.5–15.5)
WBC: 6.5 10*3/uL (ref 4.0–10.5)

## 2021-12-11 LAB — TSH: TSH: 1.41 u[IU]/mL (ref 0.35–5.50)

## 2021-12-11 LAB — LDL CHOLESTEROL, DIRECT: Direct LDL: 64 mg/dL

## 2021-12-11 LAB — HEPATITIS C ANTIBODY: Hepatitis C Ab: NONREACTIVE

## 2021-12-11 LAB — PSA: PSA: 0.89 ng/mL (ref 0.10–4.00)

## 2021-12-11 NOTE — Assessment & Plan Note (Signed)
uds and contract utd   

## 2021-12-11 NOTE — Assessment & Plan Note (Signed)
ghm utd Check labs  See avs  

## 2021-12-11 NOTE — Assessment & Plan Note (Signed)
Encourage heart healthy diet such as MIND or DASH diet, increase exercise, avoid trans fats, simple carbohydrates and processed foods, consider a krill or fish or flaxseed oil cap daily.  °

## 2021-12-11 NOTE — Assessment & Plan Note (Signed)
uds and contract utd Database reviewed  F/u 3 months

## 2021-12-11 NOTE — Assessment & Plan Note (Signed)
Well controlled, no changes to meds. Encouraged heart healthy diet such as the DASH diet and exercise as tolerated.  °

## 2021-12-15 ENCOUNTER — Other Ambulatory Visit: Payer: Self-pay | Admitting: Family Medicine

## 2021-12-15 DIAGNOSIS — G8929 Other chronic pain: Secondary | ICD-10-CM

## 2021-12-16 ENCOUNTER — Other Ambulatory Visit: Payer: Self-pay | Admitting: Family Medicine

## 2021-12-16 ENCOUNTER — Encounter: Payer: Self-pay | Admitting: Family Medicine

## 2021-12-16 ENCOUNTER — Other Ambulatory Visit (HOSPITAL_BASED_OUTPATIENT_CLINIC_OR_DEPARTMENT_OTHER): Payer: Self-pay

## 2021-12-16 DIAGNOSIS — G8929 Other chronic pain: Secondary | ICD-10-CM

## 2021-12-16 MED ORDER — HYDROCODONE-ACETAMINOPHEN 5-325 MG PO TABS: 1.0000 | ORAL_TABLET | Freq: Four times a day (QID) | ORAL | 0 refills | Status: DC | PRN

## 2022-01-08 ENCOUNTER — Telehealth: Payer: Self-pay | Admitting: Family Medicine

## 2022-01-08 DIAGNOSIS — G8929 Other chronic pain: Secondary | ICD-10-CM

## 2022-01-08 MED ORDER — HYDROCODONE-ACETAMINOPHEN 5-325 MG PO TABS: 1.0000 | ORAL_TABLET | Freq: Four times a day (QID) | ORAL | 0 refills | Status: DC | PRN

## 2022-01-08 NOTE — Telephone Encounter (Signed)
PDMP review, prescription sent 

## 2022-01-08 NOTE — Telephone Encounter (Signed)
Requesting:hydrocodone 5-325mg   Contract: 06/07/21 UDS:06/07/21 Last Visit: 12/10/21 Next Visit: 03/17/22 Last Refill: 12/16/21 #45 and 0RF   Please Advise

## 2022-01-29 ENCOUNTER — Other Ambulatory Visit: Payer: Self-pay | Admitting: Internal Medicine

## 2022-01-29 DIAGNOSIS — G8929 Other chronic pain: Secondary | ICD-10-CM

## 2022-01-30 ENCOUNTER — Other Ambulatory Visit: Payer: Self-pay | Admitting: Internal Medicine

## 2022-01-30 DIAGNOSIS — G8929 Other chronic pain: Secondary | ICD-10-CM

## 2022-01-30 MED ORDER — HYDROCODONE-ACETAMINOPHEN 5-325 MG PO TABS: 1.0000 | ORAL_TABLET | Freq: Four times a day (QID) | ORAL | 0 refills | Status: DC | PRN

## 2022-01-30 NOTE — Telephone Encounter (Signed)
Requesting: hydrocodone 5-325mg   Contract: 06/07/21 UDS: 06/07/21 Last Visit: 12/10/21 Next Visit: 03/17/22 Last Refill: 01/08/22 #45 and 0RF   Please Advise

## 2022-02-19 ENCOUNTER — Other Ambulatory Visit: Payer: Self-pay | Admitting: Family Medicine

## 2022-02-19 DIAGNOSIS — G8929 Other chronic pain: Secondary | ICD-10-CM

## 2022-02-19 NOTE — Telephone Encounter (Signed)
Requesting: hydrocodone 5-325mg   Contract: 06/07/21 UDS: 06/07/21 Last Visit: 12/10/21 Next Visit: 03/17/22 Last Refill: 01/30/2022 #45 and 0RF (Only an 11 day supply)  Please Advise

## 2022-02-20 MED ORDER — HYDROCODONE-ACETAMINOPHEN 5-325 MG PO TABS: 1.0000 | ORAL_TABLET | Freq: Four times a day (QID) | ORAL | 0 refills | Status: DC | PRN

## 2022-03-10 ENCOUNTER — Encounter: Payer: Self-pay | Admitting: Family Medicine

## 2022-03-10 ENCOUNTER — Encounter: Payer: Self-pay | Admitting: Medical

## 2022-03-10 ENCOUNTER — Other Ambulatory Visit: Payer: Self-pay | Admitting: Family Medicine

## 2022-03-10 DIAGNOSIS — G8929 Other chronic pain: Secondary | ICD-10-CM

## 2022-03-10 MED ORDER — HYDROCODONE-ACETAMINOPHEN 5-325 MG PO TABS: 1.0000 | ORAL_TABLET | Freq: Four times a day (QID) | ORAL | 0 refills | Status: DC | PRN

## 2022-03-10 NOTE — Telephone Encounter (Signed)
Requesting: hydrocodone/apap 5/325mg  Contract: 06/07/21 UDS: 06/07/21 Last Visit: 12/10/21 Next Visit: 03/21/22 Last Refill: 02/20/22  Please Advise

## 2022-03-17 ENCOUNTER — Ambulatory Visit: Payer: 59 | Admitting: Family Medicine

## 2022-03-21 ENCOUNTER — Ambulatory Visit: Payer: 59 | Admitting: Family Medicine

## 2022-03-21 ENCOUNTER — Encounter: Payer: Self-pay | Admitting: Family Medicine

## 2022-03-21 VITALS — BP 140/88 | HR 85 | Temp 97.7°F | Resp 18 | Ht 67.5 in | Wt 280.6 lb

## 2022-03-21 DIAGNOSIS — F419 Anxiety disorder, unspecified: Secondary | ICD-10-CM | POA: Diagnosis not present

## 2022-03-21 DIAGNOSIS — Z79899 Other long term (current) drug therapy: Secondary | ICD-10-CM | POA: Diagnosis not present

## 2022-03-21 DIAGNOSIS — I10 Essential (primary) hypertension: Secondary | ICD-10-CM

## 2022-03-21 DIAGNOSIS — G8929 Other chronic pain: Secondary | ICD-10-CM

## 2022-03-21 DIAGNOSIS — M25561 Pain in right knee: Secondary | ICD-10-CM

## 2022-03-21 DIAGNOSIS — M25512 Pain in left shoulder: Secondary | ICD-10-CM

## 2022-03-21 DIAGNOSIS — M25562 Pain in left knee: Secondary | ICD-10-CM

## 2022-03-21 DIAGNOSIS — M25511 Pain in right shoulder: Secondary | ICD-10-CM

## 2022-03-21 MED ORDER — HYDROCODONE-ACETAMINOPHEN 5-325 MG PO TABS: 1.0000 | ORAL_TABLET | Freq: Four times a day (QID) | ORAL | 0 refills | Status: DC | PRN

## 2022-03-21 MED ORDER — SERTRALINE HCL 50 MG PO TABS
50.0000 mg | ORAL_TABLET | Freq: Every day | ORAL | 3 refills | Status: DC
Start: 2022-03-21 — End: 2022-06-20

## 2022-03-21 NOTE — Assessment & Plan Note (Signed)
Per ortho.  

## 2022-03-21 NOTE — Assessment & Plan Note (Signed)
Pt is here for f/u bp and pain He will have shoulder surgery soon with Dr Rennis Chris

## 2022-03-21 NOTE — Patient Instructions (Signed)
Pain Relief Before and After Surgery  Pain relief is an important part of your overall care before, during, and after surgery. You and your health care provider will work together to make a plan to manage any pain that you have before surgery (preoperative) and after surgery (postoperative). Addressing pain before surgery lessens the pain that you will have after surgery. Make sure that you fully understand and agree with your pain relief plan. If you have questions or concerns, it is important to discuss them with your health care provider. If you have pain that is not controlled by medicine, tell your health care provider. Severe pain after surgery may: Prevent sleep. Decrease your ability to breathe deeply and to cough. This can result in pneumonia or upper airway infections. Cause your heart to beat more quickly. Cause your blood pressure to be higher. Increase your risk for stomach and digestive problems. Slow down wound healing. Lead to depression, anxiety, and feelings of helplessness. Your health care provider may use more than one method at a time to help relieve your pain. Using this approach may allow you to eat, move around, and possibly leave the hospital sooner. What are options for managing pain before and after surgery? Oral pain medicines Pain medicines taken by mouth (orally) include: Non-narcotic medicines: Acetaminophen. NSAIDs, such as ibuprofen and naproxen. Muscle relaxants. These may relieve pain caused by muscle spasms. Anticonvulsants. These medicines are usually used to treat seizures. They may help to lessen nerve pain. Opioids. These medicines relieve pain by binding to pain receptors in the brain and spinal cord (narcotic pain medicines). Opioids may help relieve short-term (acute) postoperative pain that is moderate to moderately severe. Opioids are often combined with non-narcotic medicines to improve pain relief, lower the risk of side effects, and lower the  chance of addiction. Some common side effects of opioids include constipation, nausea, and excessive sleepiness. To help prevent addiction, opioids are given for short periods of time in careful doses. If you follow instructions from your health care provider and you do not have a history of substance abuse, your risk of becoming addicted to opioids is low. Some of these medicines may be available in injectable form. They may be given through an IV if you are unable to eat or drink. As-needed pain control You can receive pain medicine when you need it, through an IV or as a pill or liquid. When you tell your health care provider that you are having pain, he or she will give you the proper pain medicine. Medicine that numbs an area You may be given pain medicine that numbs an area. This is called a local anesthetic. It may be given: As an injection near your painful area (local infiltration). As an injection near the nerve that provides feeling to a specific part of your body (peripheral nerve block). As an injection in your spine (spinal block). Through a local anesthetic reservoir pump. For this method, one or more small, thin tubes (catheters) are inserted into your incision at the end of your procedure. These catheters are connected to a device that is filled with a non-narcotic pain medicine. Medicine gradually empties into your incision area over the next several days. Continuous epidural pain control With this method, you receive pain medicine through a catheter that is inserted into your back, near your spinal cord. Medicine flows through the catheter to lessen pain in areas of your body that are below the catheter. The catheter is usually put into the back   shortly before surgery. It may be left in until you can eat, take medicine by mouth, pass urine, and have a bowel movement. This method may be recommended if you are having surgery on your abdomen, hip area, or legs. This method of pain  relief may help you heal faster because you may be able to do these things sooner: Regain normal bowel and bladder function. Return to eating. Get up and walk. IV patient-controlled analgesia (PCA) pump With this method, you receive pain medicine through an IV that is connected to a PCA pump. The PCA pump gives you a specific amount of medicine when you push a button. This lets you control how much medicine you receive. You are the only person who should push this button. The pump is set up so that you cannot accidentally give yourself too much medicine. You will be able to start using your PCA pump in the recovery room after your procedure. Tell your health care provider: If you are having too much pain. If you cannot push the button. If you are feeling too sleepy or nauseous. Other pain control methods Other methods of pain relief after surgery include: Heat and cold therapy. Massage. Topical analgesics. These are patches, creams, and gels that can be applied on the skin. Steroid medicines. These medicines may be given to lessen swelling. Physical therapy. A physical therapist will work with you to meet goals, such as feeling and functioning better. Physical therapy usually includes specific exercises that are tailored to your needs. Transcutaneous electrical nerve stimulation (TENS). This method sends electrical signals through the skin to interrupt pain signals. Cognitive behavioral therapy (CBT). This therapy helps you learn coping skills for dealing with pain. What are some questions to ask my health care provider? What pain relief options would be best for me? What are the risks of each option? What are the benefits of each option? How long will I need pain relief after surgery? Summary A plan to manage pain that you may have before surgery (preoperative) and after surgery (postoperative) is an important part of your overall care. Pain management options include medicines and  nonmedical therapies, such as physical therapy, massage, and heat or cold therapy. Pain management medicines include opioids and non-narcotic medicines such as NSAIDs, steroids, or local anesthetics. Pain medicines can have side effects. Side effects of opioids include constipation, nausea, excessive sleepiness, and risk of addiction. Your health care provider will work with you to prevent or manage these side effects and risks. This information is not intended to replace advice given to you by your health care provider. Make sure you discuss any questions you have with your health care provider. Document Revised: 07/05/2020 Document Reviewed: 07/05/2020 [employer]vier Patient Education  2023 [employer]  

## 2022-03-21 NOTE — Progress Notes (Signed)
Subjective:   By signing my name below, I, Carylon Perches, attest that this documentation has been prepared under the direction and in the presence of Ann Held DO 03/21/2022     Patient ID: Jonathan Hansen, male    DOB: 1976/01/01, 46 y.o.   MRN: [id number]  Chief Complaint  Patient presents with   Pain   Follow-up    HPI Patient is in today for an office visit  He reports that he does not need any refills at this current moment.   He reports that he has an arthroscopic surgery by Dr.Supple scheduled for 06/14/2021 for chronic right shoulder pain. He states that he previously received cortisone injections for the area but did not respond well to the injections. He states that he plans to taper off the 5-325 mcg of Norco/Vicodin prior to undergoing surgery.   He is requesting a handicap sticker.   Past Medical History:  Diagnosis Date   Arthritis    Back pain    Chest pain    Chronic knee pain    Depression    Dyspnea    Elevated liver enzymes    Fatty liver    HLD (hyperlipidemia)    HTN (hypertension)    Joint pain    Leg edema    Osteoarthritis    Pre-diabetes    Vitamin D deficiency     Past Surgical History:  Procedure Laterality Date   KNEE ARTHROSCOPY Right    VASECTOMY      Family History  Problem Relation Age of Onset   COPD Mother    Lung cancer Mother    Stroke Father    Coronary artery disease Father        carotid stenosis   Diabetes Father    Diabetes Paternal Grandfather     Social History   Socioeconomic History   Marital status: Married    Spouse name: April Drozdowski   Number of children: 1   Years of education: Not on file   Highest education level: Not on file  Occupational History   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  Tobacco Use   Smoking status: Former    Years: 25.00    Types: Cigarettes   Smokeless tobacco: Never   Tobacco comments:    now vape no cigarettes  Vaping Use   Vaping Use: Every day    Substances: Nicotine  Substance and Sexual Activity   Alcohol use: Not Currently    Comment: nothing in months 06/22/2018   Drug use: Never   Sexual activity: Yes    Partners: Female  Other Topics Concern   Not on file  Social History Narrative   Not on file   Social Determinants of Health   Financial Resource Strain: Not on file  Food Insecurity: Not on file  Transportation Needs: Not on file  Physical Activity: Not on file  Stress: Not on file  Social Connections: Not on file  Intimate Partner Violence: Not on file    Outpatient Medications Prior to Visit  Medication Sig Dispense Refill   HYDROcodone-acetaminophen (NORCO/VICODIN) 5-325 MG tablet Take 1 tablet by mouth every 6 (six) hours as needed for moderate pain. 45 tablet 0   sertraline (ZOLOFT) 50 MG tablet Take 1 tablet (50 mg total) by mouth daily. 90 tablet 3   No facility-administered medications prior to visit.    Allergies  Allergen Reactions   Ibuprofen Other (See Comments)    Bleeding   Penicillins  Review of Systems  Constitutional:  Negative for fever and malaise/fatigue.  HENT:  Negative for congestion.   Eyes:  Negative for blurred vision.  Respiratory:  Negative for cough and shortness of breath.   Cardiovascular:  Negative for chest pain, palpitations and leg swelling.  Gastrointestinal:  Negative for vomiting.  Musculoskeletal:  Negative for back pain.  Skin:  Negative for rash.  Neurological:  Negative for loss of consciousness and headaches.       Objective:    Physical Exam Vitals and nursing note reviewed.  Constitutional:      General: He is not in acute distress.    Appearance: Normal appearance. He is well-developed. He is not ill-appearing.  HENT:     Head: Normocephalic and atraumatic.     Right Ear: External ear normal.     Left Ear: External ear normal.  Eyes:     Extraocular Movements: Extraocular movements intact.     Pupils: Pupils are equal, round, and reactive to  light.  Neck:     Thyroid: No thyromegaly.  Cardiovascular:     Rate and Rhythm: Normal rate and regular rhythm.     Heart sounds: Normal heart sounds. No murmur heard.    No gallop.  Pulmonary:     Effort: Pulmonary effort is normal. No respiratory distress.     Breath sounds: Normal breath sounds. No wheezing or rales.  Chest:     Chest wall: No tenderness.  Musculoskeletal:        General: Tenderness present.     Cervical back: Normal range of motion and neck supple.     Right hip: Tenderness present. Normal range of motion. Normal strength.     Left hip: Tenderness present. Normal range of motion. Normal strength.     Right foot: Bony tenderness present. No swelling.     Left foot: Bony tenderness present. No swelling.  Skin:    General: Skin is warm and dry.  Neurological:     Mental Status: He is alert and oriented to person, place, and time.  Psychiatric:        Behavior: Behavior normal.        Thought Content: Thought content normal.        Judgment: Judgment normal.     BP (!) 140/88 (BP Location: Left Arm, Patient Position: Sitting, Cuff Size: Large)   Pulse 85   Temp 97.7 F (36.5 C) (Oral)   Resp 18   Ht 5' 7.5" (1.715 m)   Wt 280 lb 9.6 oz (127.3 kg)   SpO2 97%   BMI 43.30 kg/m  Wt Readings from Last 3 Encounters:  03/21/22 280 lb 9.6 oz (127.3 kg)  12/10/21 285 lb 12.8 oz (129.6 kg)  10/29/21 279 lb (126.6 kg)    Diabetic Foot Exam - Simple   No data filed    Lab Results  Component Value Date   WBC 6.5 12/10/2021   HGB 15.6 12/10/2021   HCT 45.1 12/10/2021   PLT 217.0 12/10/2021   GLUCOSE 90 12/10/2021   CHOL 189 12/10/2021   TRIG 266.0 (H) 12/10/2021   HDL 47.80 12/10/2021   LDLDIRECT 64.0 12/10/2021   LDLCALC 58 05/19/2019   ALT 37 12/10/2021   AST 24 12/10/2021   NA 137 12/10/2021   K 4.0 12/10/2021   CL 100 12/10/2021   CREATININE 0.88 12/10/2021   BUN 12 12/10/2021   CO2 27 12/10/2021   TSH 1.41 12/10/2021   PSA 0.89  12/10/2021  INR 1.2 (H) 05/19/2019   HGBA1C 5.4 05/19/2019    Lab Results  Component Value Date   TSH 1.41 12/10/2021   Lab Results  Component Value Date   WBC 6.5 12/10/2021   HGB 15.6 12/10/2021   HCT 45.1 12/10/2021   MCV 89.1 12/10/2021   PLT 217.0 12/10/2021   Lab Results  Component Value Date   NA 137 12/10/2021   K 4.0 12/10/2021   CO2 27 12/10/2021   GLUCOSE 90 12/10/2021   BUN 12 12/10/2021   CREATININE 0.88 12/10/2021   BILITOT 0.7 12/10/2021   ALKPHOS 49 12/10/2021   AST 24 12/10/2021   ALT 37 12/10/2021   PROT 7.6 12/10/2021   ALBUMIN 4.4 12/10/2021   CALCIUM 9.4 12/10/2021   GFR 101.62 12/10/2021   Lab Results  Component Value Date   CHOL 189 12/10/2021   Lab Results  Component Value Date   HDL 47.80 12/10/2021   Lab Results  Component Value Date   LDLCALC 58 05/19/2019   Lab Results  Component Value Date   TRIG 266.0 (H) 12/10/2021   Lab Results  Component Value Date   CHOLHDL 4 12/10/2021   Lab Results  Component Value Date   HGBA1C 5.4 05/19/2019       Assessment & Plan:   Problem List Items Addressed This Visit       Unprioritized   Essential hypertension    Pt is here for f/u bp and pain He will have shoulder surgery soon with Dr Onnie Graham        Chronic pain of both shoulders    Per ortho      Relevant Medications   sertraline (ZOLOFT) 50 MG tablet   HYDROcodone-acetaminophen (NORCO/VICODIN) 5-325 MG tablet   Chronic pain of both knees   Relevant Medications   sertraline (ZOLOFT) 50 MG tablet   HYDROcodone-acetaminophen (NORCO/VICODIN) 5-325 MG tablet   Anxiety   Relevant Medications   sertraline (ZOLOFT) 50 MG tablet   Other Visit Diagnoses     High risk medication use    -  Primary   Relevant Orders   Drug Monitoring Panel [phone] , Urine      Meds ordered this encounter  Medications   sertraline (ZOLOFT) 50 MG tablet    Sig: Take 1 tablet (50 mg total) by mouth daily.    Dispense:  90 tablet     Refill:  3   HYDROcodone-acetaminophen (NORCO/VICODIN) 5-325 MG tablet    Sig: Take 1 tablet by mouth every 6 (six) hours as needed for moderate pain.    Dispense:  45 tablet    Refill:  0    I, Ann Held, DO, personally preformed the services described in this documentation.  All medical record entries made by the scribe were at my direction and in my presence.  I have reviewed the chart and discharge instructions (if applicable) and agree that the record reflects my personal performance and is accurate and complete. 03/21/2022  I,Amber Collins,acting as a scribe for Ann Held, DO.,have documented all relevant documentation on the behalf of Ann Held, DO,as directed by  Ann Held, DO while in the presence of Ann Held, DO.  Ann Held, DO

## 2022-03-27 LAB — DRUG MONITORING PANEL 376104, URINE
Amphetamines: NEGATIVE ng/mL (ref <500)
Barbiturates: NEGATIVE ng/mL (ref <300)
Benzodiazepines: NEGATIVE ng/mL (ref <100)
Cocaine Metabolite: NEGATIVE ng/mL (ref <150)
Desmethyltramadol: NEGATIVE ng/mL (ref <100)
Opiates: NEGATIVE ng/mL (ref <100)
Oxycodone: NEGATIVE ng/mL (ref <100)
Tramadol: NEGATIVE ng/mL (ref <100)

## 2022-03-27 LAB — DM TEMPLATE

## 2022-04-08 ENCOUNTER — Other Ambulatory Visit: Payer: Self-pay | Admitting: Family Medicine

## 2022-04-08 DIAGNOSIS — G8929 Other chronic pain: Secondary | ICD-10-CM

## 2022-04-11 ENCOUNTER — Other Ambulatory Visit: Payer: Self-pay | Admitting: Family Medicine

## 2022-04-11 DIAGNOSIS — G8929 Other chronic pain: Secondary | ICD-10-CM

## 2022-04-16 ENCOUNTER — Other Ambulatory Visit: Payer: Self-pay | Admitting: Family Medicine

## 2022-04-16 DIAGNOSIS — G8929 Other chronic pain: Secondary | ICD-10-CM

## 2022-04-16 DIAGNOSIS — F419 Anxiety disorder, unspecified: Secondary | ICD-10-CM

## 2022-04-16 MED ORDER — HYDROCODONE-ACETAMINOPHEN 5-325 MG PO TABS: 1.0000 | ORAL_TABLET | Freq: Four times a day (QID) | ORAL | 0 refills | Status: DC | PRN

## 2022-04-16 NOTE — Telephone Encounter (Signed)
[facility] Pt    Requesting: hydrocodone 5-325mg   Contract: 03/21/22 UDS: 03/25/22 Last Visit: 12/10/21 Next Visit: 06/20/22 Last Refill: 03/21/22 #45 and 0RF   Please Advise

## 2022-05-05 ENCOUNTER — Other Ambulatory Visit: Payer: Self-pay | Admitting: Family Medicine

## 2022-05-05 DIAGNOSIS — G8929 Other chronic pain: Secondary | ICD-10-CM

## 2022-05-05 MED ORDER — HYDROCODONE-ACETAMINOPHEN 5-325 MG PO TABS: 1.0000 | ORAL_TABLET | Freq: Four times a day (QID) | ORAL | 0 refills | Status: DC | PRN

## 2022-05-05 NOTE — Telephone Encounter (Signed)
Requesting: Norco/Vicodin  Contract: 03/21/2022 UDS:03/25/2022 Last Visit: 03/21/2022 Next Visit: 06/20/2022 Last Refill: 04/16/2022  Please Advise

## 2022-05-26 ENCOUNTER — Other Ambulatory Visit: Payer: Self-pay | Admitting: Family Medicine

## 2022-05-26 DIAGNOSIS — G8929 Other chronic pain: Secondary | ICD-10-CM

## 2022-05-26 NOTE — Telephone Encounter (Signed)
Requesting: hydrocodone 5-325 Contract: Yes UDS: 03/21/22 Last Visit: 03/21/2022 Next Visit: 06/20/2022 Last Refill: 05/05/22  Please Advise

## 2022-05-27 MED ORDER — HYDROCODONE-ACETAMINOPHEN 5-325 MG PO TABS: 1.0000 | ORAL_TABLET | Freq: Four times a day (QID) | ORAL | 0 refills | Status: DC | PRN

## 2022-06-16 ENCOUNTER — Encounter: Payer: Self-pay | Admitting: Family Medicine

## 2022-06-16 DIAGNOSIS — G8929 Other chronic pain: Secondary | ICD-10-CM

## 2022-06-16 NOTE — Telephone Encounter (Signed)
Requesting: hydrocodone 5-'325mg'$   Contract: 03/21/22 UDS: 03/25/22 Last Visit: 03/21/22 Next Visit: 06/20/22 Last Refill: 05/27/22 #45 and 0RF   Please Advise

## 2022-06-17 ENCOUNTER — Other Ambulatory Visit: Payer: Self-pay | Admitting: Family Medicine

## 2022-06-17 DIAGNOSIS — G8929 Other chronic pain: Secondary | ICD-10-CM

## 2022-06-17 MED ORDER — HYDROCODONE-ACETAMINOPHEN 5-325 MG PO TABS: 1.0000 | ORAL_TABLET | Freq: Four times a day (QID) | ORAL | 0 refills | Status: DC | PRN

## 2022-06-20 ENCOUNTER — Ambulatory Visit (INDEPENDENT_AMBULATORY_CARE_PROVIDER_SITE_OTHER): Payer: 59 | Admitting: Family Medicine

## 2022-06-20 ENCOUNTER — Encounter: Payer: Self-pay | Admitting: Family Medicine

## 2022-06-20 VITALS — BP 160/98 | HR 78 | Temp 98.1°F | Resp 18 | Ht 67.5 in | Wt 292.4 lb

## 2022-06-20 DIAGNOSIS — F321 Major depressive disorder, single episode, moderate: Secondary | ICD-10-CM

## 2022-06-20 DIAGNOSIS — M25562 Pain in left knee: Secondary | ICD-10-CM

## 2022-06-20 DIAGNOSIS — M25511 Pain in right shoulder: Secondary | ICD-10-CM | POA: Diagnosis not present

## 2022-06-20 DIAGNOSIS — I10 Essential (primary) hypertension: Secondary | ICD-10-CM

## 2022-06-20 DIAGNOSIS — G8929 Other chronic pain: Secondary | ICD-10-CM

## 2022-06-20 DIAGNOSIS — M25561 Pain in right knee: Secondary | ICD-10-CM

## 2022-06-20 DIAGNOSIS — M25512 Pain in left shoulder: Secondary | ICD-10-CM

## 2022-06-20 MED ORDER — LOSARTAN POTASSIUM 50 MG PO TABS: 50.0000 mg | ORAL_TABLET | Freq: Every day | ORAL | 1 refills | Status: DC

## 2022-06-20 MED ORDER — SERTRALINE HCL 100 MG PO TABS: 100.0000 mg | ORAL_TABLET | Freq: Every day | ORAL | 3 refills | Status: DC

## 2022-06-20 NOTE — Assessment & Plan Note (Signed)
Improved but still has pain  Uds, contract utd Pain meds refilled earlier this week

## 2022-06-20 NOTE — Patient Instructions (Signed)

## 2022-06-20 NOTE — Assessment & Plan Note (Signed)
Poorly controlled will alter medications, encouraged DASH diet, minimize caffeine and obtain adequate sleep. Report concerning symptoms and follow up as directed and as needed  Losartan started  F/u 3 months

## 2022-06-20 NOTE — Progress Notes (Signed)
Subjective:   By signing my name below, I, Marlana Latus, attest that this documentation has been prepared under the direction and in the presence of Ann Held, DO 06/20/22   Patient ID: Jonathan Hansen, male    DOB: 1975/05/12, 47 y.o.   MRN: [id number]  Chief Complaint  Patient presents with   Pain   Follow-up    HPI Patient is in today for a 3 month follow up.   He reports that he was planning to have right shoulder surgery with Dr. Onnie Graham but has been delayed due to changes at work. He also is planning to sell his house and move. He hopes to reschedule his shoulder surgery for sometime later this year.   He endorses having heartburn lasting a couple of days. He reports that he has been taking Tums for the last 2-3 nights. He attributes the heartburn to his eating habits. He and his wife have been eating out a lot recently due to them frequently showing their house.   He reports a bit of bilateral swelling in his knees. He also endorses occasional sharp pain and pressure on his left knee. She described the left knee pressure as "1000 mattresses stacked" on him.    He believes his high blood pressure is attributed to his diet. He is planning to try to eat better to manage his blood pressure.  Past Medical History:  Diagnosis Date   Arthritis    Back pain    Chest pain    Chronic knee pain    Depression    Dyspnea    Elevated liver enzymes    Fatty liver    HLD (hyperlipidemia)    HTN (hypertension)    Joint pain    Leg edema    Osteoarthritis    Pre-diabetes    Vitamin D deficiency     Past Surgical History:  Procedure Laterality Date   KNEE ARTHROSCOPY Right    VASECTOMY      Family History  Problem Relation Age of Onset   COPD Mother    Lung cancer Mother    Stroke Father    Coronary artery disease Father        carotid stenosis   Diabetes Father    Diabetes Paternal Grandfather     Social History   Socioeconomic History   Marital  status: Married    Spouse name: April Remedios   Number of children: 1   Years of education: Not on file   Highest education level: Not on file  Occupational History   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  Tobacco Use   Smoking status: Former    Years: 25.00    Types: Cigarettes   Smokeless tobacco: Never   Tobacco comments:    now vape no cigarettes  Vaping Use   Vaping Use: Every day   Substances: Nicotine  Substance and Sexual Activity   Alcohol use: Not Currently    Comment: nothing in months 06/22/2018   Drug use: Never   Sexual activity: Yes    Partners: Female  Other Topics Concern   Not on file  Social History Narrative   Not on file   Social Determinants of Health   Financial Resource Strain: Not on file  Food Insecurity: Not on file  Transportation Needs: Not on file  Physical Activity: Not on file  Stress: Not on file  Social Connections: Not on file  Intimate Partner Violence: Not on file    Outpatient Medications Prior  to Visit  Medication Sig Dispense Refill   HYDROcodone-acetaminophen (NORCO/VICODIN) 5-325 MG tablet Take 1 tablet by mouth every 6 (six) hours as needed for moderate pain. 45 tablet 0   sertraline (ZOLOFT) 50 MG tablet Take 1 tablet (50 mg total) by mouth daily. 90 tablet 3   No facility-administered medications prior to visit.    Allergies  Allergen Reactions   Ibuprofen Other (See Comments)    Bleeding   Penicillins     Review of Systems  Constitutional:  Negative for fever and malaise/fatigue.  HENT:  Negative for congestion.   Eyes:  Negative for blurred vision.  Respiratory:  Negative for shortness of breath.   Cardiovascular:  Negative for chest pain, palpitations and leg swelling.  Gastrointestinal:  Positive for heartburn (2-3 days). Negative for abdominal pain, blood in stool and nausea.  Genitourinary:  Negative for dysuria and frequency.  Musculoskeletal:  Positive for joint pain ((+) right shoulder pain, (+) bilateral  sharp knee pain and pressure). Negative for falls.  Skin:  Negative for rash.  Neurological:  Negative for dizziness, loss of consciousness and headaches.  Endo/Heme/Allergies:  Negative for environmental allergies.  Psychiatric/Behavioral:  Negative for depression. The patient is not nervous/anxious.        Objective:    Physical Exam Vitals and nursing note reviewed.  Constitutional:      Appearance: He is well-developed.  HENT:     Head: Normocephalic and atraumatic.  Eyes:     Pupils: Pupils are equal, round, and reactive to light.  Neck:     Thyroid: No thyromegaly.  Cardiovascular:     Rate and Rhythm: Normal rate and regular rhythm.     Heart sounds: No murmur heard. Pulmonary:     Effort: Pulmonary effort is normal. No respiratory distress.     Breath sounds: Normal breath sounds. No wheezing or rales.  Chest:     Chest wall: No tenderness.  Musculoskeletal:     Cervical back: Normal range of motion and neck supple.     Right hip: Tenderness present. Normal range of motion. Normal strength.     Left hip: Tenderness present. Normal range of motion. Normal strength.     Right foot: Bony tenderness present. No swelling.     Left foot: Bony tenderness present. No swelling.  Skin:    General: Skin is warm and dry.  Neurological:     Mental Status: He is alert and oriented to person, place, and time.  Psychiatric:        Behavior: Behavior normal.        Thought Content: Thought content normal.        Judgment: Judgment normal.     BP (!) 160/98   Pulse 78   Temp 98.1 F (36.7 C) (Oral)   Resp 18   Ht 5' 7.5" (1.715 m)   Wt 292 lb 6.4 oz (132.6 kg)   SpO2 96%   BMI 45.12 kg/m  Wt Readings from Last 3 Encounters:  06/20/22 292 lb 6.4 oz (132.6 kg)  03/21/22 280 lb 9.6 oz (127.3 kg)  12/10/21 285 lb 12.8 oz (129.6 kg)       Assessment & Plan:  Chronic pain of both knees Assessment & Plan: Improved but still has pain  Uds, contract utd Pain meds  refilled earlier this week    Chronic pain of both shoulders Assessment & Plan: Uds / contract utd Pt will have surgery in the fall   Depression, major, single episode,  moderate (HCC) -     Sertraline HCl; Take 1 tablet (100 mg total) by mouth daily.  Dispense: 90 tablet; Refill: 3  Primary hypertension -     Losartan Potassium; Take 1 tablet (50 mg total) by mouth daily.  Dispense: 90 tablet; Refill: 1     I,Rachel Rivera,acting as a scribe for [employer], DO.,have documented all relevant documentation on the behalf of Ann Held, DO,as directed by  Ann Held, DO while in the presence of Ann Held, DO.   I, Ann Held, DO, personally preformed the services described in this documentation.  All medical record entries made by the scribe were at my direction and in my presence.  I have reviewed the chart and discharge instructions (if applicable) and agree that the record reflects my personal performance and is accurate and complete. 06/20/22   Ann Held, DO

## 2022-06-20 NOTE — Assessment & Plan Note (Signed)
Uds / contract utd Pt will have surgery in the fall

## 2022-07-07 ENCOUNTER — Other Ambulatory Visit: Payer: Self-pay | Admitting: Family Medicine

## 2022-07-07 DIAGNOSIS — G8929 Other chronic pain: Secondary | ICD-10-CM

## 2022-07-07 MED ORDER — HYDROCODONE-ACETAMINOPHEN 5-325 MG PO TABS: 1.0000 | ORAL_TABLET | Freq: Four times a day (QID) | ORAL | 0 refills | Status: DC | PRN

## 2022-07-07 NOTE — Telephone Encounter (Signed)
Requesting: hydrocodone 5-325mg   Contract: 03/21/22  UDS: 03/25/22  Last Visit: 06/20/22 Next Visit: 09/26/22 Last Refill: 06/17/22 #45 and 0RF   Please Advise

## 2022-07-24 ENCOUNTER — Other Ambulatory Visit: Payer: Self-pay | Admitting: Family Medicine

## 2022-07-24 DIAGNOSIS — G8929 Other chronic pain: Secondary | ICD-10-CM

## 2022-07-28 ENCOUNTER — Other Ambulatory Visit: Payer: Self-pay | Admitting: Family Medicine

## 2022-07-28 DIAGNOSIS — G8929 Other chronic pain: Secondary | ICD-10-CM

## 2022-07-28 MED ORDER — HYDROCODONE-ACETAMINOPHEN 5-325 MG PO TABS: 1.0000 | ORAL_TABLET | Freq: Four times a day (QID) | ORAL | 0 refills | Status: DC | PRN

## 2022-07-28 NOTE — Telephone Encounter (Signed)
Requesting: hydrocodone 5-325mg  Contract: 04/17/22 UDS: 03/25/22 Last Visit: 06/20/22 Next Visit: 09/26/22 Last Refill: 07/07/22 #45 and 0RF  Please Advise

## 2022-08-18 ENCOUNTER — Other Ambulatory Visit: Payer: Self-pay | Admitting: Family Medicine

## 2022-08-18 DIAGNOSIS — G8929 Other chronic pain: Secondary | ICD-10-CM

## 2022-08-18 MED ORDER — HYDROCODONE-ACETAMINOPHEN 5-325 MG PO TABS
1.0000 | ORAL_TABLET | Freq: Four times a day (QID) | ORAL | 0 refills | Status: DC | PRN
Start: 2022-08-18 — End: 2022-09-08

## 2022-08-18 NOTE — Telephone Encounter (Signed)
Requesting: hydrocodone 5-325mg   Contract: 03/21/22 UDS: 03/25/22 Last Visit: 06/20/22 Next Visit: 09/26/22 Last Refill: 07/28/22 #45 and 0RF   Please Advise

## 2022-09-08 ENCOUNTER — Other Ambulatory Visit: Payer: Self-pay | Admitting: Family Medicine

## 2022-09-08 DIAGNOSIS — G8929 Other chronic pain: Secondary | ICD-10-CM

## 2022-09-08 MED ORDER — HYDROCODONE-ACETAMINOPHEN 5-325 MG PO TABS
1.0000 | ORAL_TABLET | Freq: Four times a day (QID) | ORAL | 0 refills | Status: DC | PRN
Start: 2022-09-08 — End: 2022-09-26

## 2022-09-08 NOTE — Telephone Encounter (Signed)
Requesting: hydrocodone 5-325mg   Contract: 03/21/22 UDS: 03/25/22 Last Visit: 06/20/22 Next Visit: 09/26/22 Last Refill: 08/18/22 #45 and 0RF   Please Advise

## 2022-09-26 ENCOUNTER — Ambulatory Visit: Payer: 59 | Admitting: Family Medicine

## 2022-09-26 ENCOUNTER — Encounter: Payer: Self-pay | Admitting: Family Medicine

## 2022-09-26 ENCOUNTER — Ambulatory Visit (HOSPITAL_BASED_OUTPATIENT_CLINIC_OR_DEPARTMENT_OTHER)
Admission: RE | Admit: 2022-09-26 | Discharge: 2022-09-26 | Disposition: A | Discharge status: Home / Self Care | Payer: 59 | Source: Ambulatory Visit | Attending: Family Medicine | Admitting: Family Medicine

## 2022-09-26 VITALS — BP 153/91 | HR 78 | Temp 97.9°F | Resp 12 | Ht 67.5 in | Wt 295.6 lb

## 2022-09-26 DIAGNOSIS — I10 Essential (primary) hypertension: Secondary | ICD-10-CM | POA: Diagnosis not present

## 2022-09-26 DIAGNOSIS — M170 Bilateral primary osteoarthritis of knee: Secondary | ICD-10-CM

## 2022-09-26 DIAGNOSIS — F321 Major depressive disorder, single episode, moderate: Secondary | ICD-10-CM

## 2022-09-26 DIAGNOSIS — E785 Hyperlipidemia, unspecified: Secondary | ICD-10-CM

## 2022-09-26 DIAGNOSIS — Z1211 Encounter for screening for malignant neoplasm of colon: Secondary | ICD-10-CM

## 2022-09-26 DIAGNOSIS — R635 Abnormal weight gain: Secondary | ICD-10-CM

## 2022-09-26 DIAGNOSIS — F17200 Nicotine dependence, unspecified, uncomplicated: Secondary | ICD-10-CM | POA: Insufficient documentation

## 2022-09-26 DIAGNOSIS — M25561 Pain in right knee: Secondary | ICD-10-CM | POA: Diagnosis not present

## 2022-09-26 DIAGNOSIS — M25512 Pain in left shoulder: Secondary | ICD-10-CM

## 2022-09-26 DIAGNOSIS — Z77098 Contact with and (suspected) exposure to other hazardous, chiefly nonmedicinal, chemicals: Secondary | ICD-10-CM

## 2022-09-26 DIAGNOSIS — G8929 Other chronic pain: Secondary | ICD-10-CM

## 2022-09-26 DIAGNOSIS — M25562 Pain in left knee: Secondary | ICD-10-CM

## 2022-09-26 DIAGNOSIS — Z79899 Other long term (current) drug therapy: Secondary | ICD-10-CM | POA: Diagnosis not present

## 2022-09-26 DIAGNOSIS — M25511 Pain in right shoulder: Secondary | ICD-10-CM

## 2022-09-26 MED ORDER — LOSARTAN POTASSIUM 100 MG PO TABS
100.0000 mg | ORAL_TABLET | Freq: Every day | ORAL | 1 refills | Status: DC
Start: 2022-09-26 — End: 2023-01-02

## 2022-09-26 MED ORDER — LOSARTAN POTASSIUM 50 MG PO TABS: 50.0000 mg | ORAL_TABLET | Freq: Every day | ORAL | 1 refills | Status: DC

## 2022-09-26 MED ORDER — HYDROCODONE-ACETAMINOPHEN 5-325 MG PO TABS
1.0000 | ORAL_TABLET | Freq: Four times a day (QID) | ORAL | 0 refills | Status: DC | PRN
Start: 2022-09-26 — End: 2022-10-16

## 2022-09-26 NOTE — Progress Notes (Signed)
Subjective:   By signing my name below, I, Jonathan Hansen, attest that this documentation has been prepared under the direction and in the presence of Jonathan Hansen R, DO. 09/26/2022.   Patient ID: Jonathan Hansen, male    DOB: Oct 08, 1975, 47 y.o.   MRN: [id number]  Chief Complaint  Patient presents with   3 month follow up    HPI Patient is in today for an office visit.  At his last appointment 06/2022, his blood pressure was poorly controlled and we initiated losartan 50 mg daily. He requests a refill of the losartan. Today his blood pressure is elevated to 153/91. He has been taking his antihypertensive every morning. When he was monitoring his blood pressure prior to his recent move, he noticed slightly elevated readings especially for his diastolic pressures. BP Readings from Last 3 Encounters:  09/26/22 (!) 153/91  06/20/22 (!) 160/98  03/21/22 (!) 140/88   He has now moved to a new cozy home in [place]. Currently he is still chewing tobacco and vaping.  He reports that there is new concern that aqueous film-forming foam (AFFF) can cause thyroid issues and certain types of cancer. He notes that he used this frequently while he was in the [employer].   Past Medical History:  Diagnosis Date   Arthritis    Back pain    Chest pain    Chronic knee pain    Depression    Dyspnea    Elevated liver enzymes    Fatty liver    HLD (hyperlipidemia)    HTN (hypertension)    Joint pain    Leg edema    Osteoarthritis    Pre-diabetes    Vitamin D deficiency     Past Surgical History:  Procedure Laterality Date   KNEE ARTHROSCOPY Right    VASECTOMY      Family History  Problem Relation Age of Onset   COPD Mother    Lung cancer Mother    Stroke Father    Coronary artery disease Father        carotid stenosis   Diabetes Father    Diabetes Paternal Grandfather     Social History   Socioeconomic History   Marital status: Married    Spouse name: April Sedgwick    Number of children: 1   Years of education: Not on file   Highest education level: Not on file  Occupational History   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  Tobacco Use   Smoking status: Former    Years: 25    Types: Cigarettes   Smokeless tobacco: Current    Types: Chew   Tobacco comments:    now vape no cigarettes  Vaping Use   Vaping Use: Every day   Substances: Nicotine  Substance and Sexual Activity   Alcohol use: Not Currently    Comment: nothing in months 06/22/2018   Drug use: Never   Sexual activity: Yes    Partners: Female  Other Topics Concern   Not on file  Social History Narrative   Not on file   Social Determinants of Health   Financial Resource Strain: Not on file  Food Insecurity: Not on file  Transportation Needs: Not on file  Physical Activity: Not on file  Stress: Not on file  Social Connections: Not on file  Intimate Partner Violence: Not on file    Outpatient Medications Prior to Visit  Medication Sig Dispense Refill   sertraline (ZOLOFT) 100 MG tablet Take 1 tablet (100  mg total) by mouth daily. 90 tablet 3   HYDROcodone-acetaminophen (NORCO/VICODIN) 5-325 MG tablet Take 1 tablet by mouth every 6 (six) hours as needed for moderate pain. 45 tablet 0   losartan (COZAAR) 50 MG tablet Take 1 tablet (50 mg total) by mouth daily. 90 tablet 1   No facility-administered medications prior to visit.    Allergies  Allergen Reactions   Ibuprofen Other (See Comments)    Bleeding   Penicillins     Review of Systems  Constitutional:  Negative for fever and malaise/fatigue.  HENT:  Negative for congestion.   Eyes:  Negative for blurred vision.  Respiratory:  Negative for shortness of breath.   Cardiovascular:  Negative for chest pain, palpitations and leg swelling.  Gastrointestinal:  Negative for abdominal pain, blood in stool and nausea.  Genitourinary:  Negative for dysuria and frequency.  Musculoskeletal:  Negative for falls.  Skin:  Negative for  rash.  Neurological:  Negative for dizziness, loss of consciousness and headaches.  Endo/Heme/Allergies:  Negative for environmental allergies.  Psychiatric/Behavioral:  Negative for depression. The patient is not nervous/anxious.        Objective:    Physical Exam Constitutional:      General: He is not in acute distress.    Appearance: Normal appearance. He is not ill-appearing.  HENT:     Head: Normocephalic and atraumatic.     Right Ear: Tympanic membrane, ear canal and external ear normal.     Left Ear: Tympanic membrane, ear canal and external ear normal.  Eyes:     Extraocular Movements: Extraocular movements intact.     Pupils: Pupils are equal, round, and reactive to light.  Cardiovascular:     Rate and Rhythm: Normal rate and regular rhythm.     Heart sounds: Normal heart sounds. No murmur heard.    No gallop.  Pulmonary:     Effort: Pulmonary effort is normal. No respiratory distress.     Breath sounds: Normal breath sounds. No wheezing or rales.  Skin:    General: Skin is warm and dry.  Neurological:     General: No focal deficit present.     Mental Status: He is alert and oriented to person, place, and time.  Psychiatric:        Mood and Affect: Mood normal.        Behavior: Behavior normal.     BP (!) 153/91 (BP Location: Left Arm, Cuff Size: Large)   Pulse 78   Temp 97.9 F (36.6 C) (Oral)   Resp 12   Ht 5' 7.5" (1.715 m)   Wt 295 lb 9.6 oz (134.1 kg)   SpO2 97%   BMI 45.61 kg/m  Wt Readings from Last 3 Encounters:  09/26/22 295 lb 9.6 oz (134.1 kg)  06/20/22 292 lb 6.4 oz (132.6 kg)  03/21/22 280 lb 9.6 oz (127.3 kg)    Diabetic Foot Exam - Simple   No data filed    Lab Results  Component Value Date   WBC 6.5 12/10/2021   HGB 15.6 12/10/2021   HCT 45.1 12/10/2021   PLT 217.0 12/10/2021   GLUCOSE 90 12/10/2021   CHOL 189 12/10/2021   TRIG 266.0 (H) 12/10/2021   HDL 47.80 12/10/2021   LDLDIRECT 64.0 12/10/2021   LDLCALC 58 05/19/2019    ALT 37 12/10/2021   AST 24 12/10/2021   NA 137 12/10/2021   K 4.0 12/10/2021   CL 100 12/10/2021   CREATININE 0.88 12/10/2021  BUN 12 12/10/2021   CO2 27 12/10/2021   TSH 2.53 09/26/2022   PSA 0.89 12/10/2021   INR 1.2 (H) 05/19/2019   HGBA1C 5.4 05/19/2019    Lab Results  Component Value Date   TSH 2.53 09/26/2022   Lab Results  Component Value Date   WBC 6.5 12/10/2021   HGB 15.6 12/10/2021   HCT 45.1 12/10/2021   MCV 89.1 12/10/2021   PLT 217.0 12/10/2021   Lab Results  Component Value Date   NA 137 12/10/2021   K 4.0 12/10/2021   CO2 27 12/10/2021   GLUCOSE 90 12/10/2021   BUN 12 12/10/2021   CREATININE 0.88 12/10/2021   BILITOT 0.7 12/10/2021   ALKPHOS 49 12/10/2021   AST 24 12/10/2021   ALT 37 12/10/2021   PROT 7.6 12/10/2021   ALBUMIN 4.4 12/10/2021   CALCIUM 9.4 12/10/2021   GFR 101.62 12/10/2021   Lab Results  Component Value Date   CHOL 189 12/10/2021   Lab Results  Component Value Date   HDL 47.80 12/10/2021   Lab Results  Component Value Date   LDLCALC 58 05/19/2019   Lab Results  Component Value Date   TRIG 266.0 (H) 12/10/2021   Lab Results  Component Value Date   CHOLHDL 4 12/10/2021   Lab Results  Component Value Date   HGBA1C 5.4 05/19/2019       Assessment & Plan:   Problem List Items Addressed This Visit       Unprioritized   Chronic pain of both knees   Relevant Medications   HYDROcodone-acetaminophen (NORCO/VICODIN) 5-325 MG tablet   Exposure to toxic chemical   Relevant Orders   Thyroid Panel With TSH (Completed)   Primary osteoarthritis of both knees    Uds / contract utd Database reviewed       Relevant Medications   HYDROcodone-acetaminophen (NORCO/VICODIN) 5-325 MG tablet   HLD (hyperlipidemia)    Tolerating statin, encouraged heart healthy diet, avoid trans fats, minimize simple carbs and saturated fats. Increase exercise as tolerated       Relevant Medications   losartan (COZAAR) 100 MG tablet    Essential hypertension    Poorly controlled will alter medications, encouraged DASH diet, minimize caffeine and obtain adequate sleep. Report concerning symptoms and follow up as directed and as needed       Relevant Medications   losartan (COZAAR) 100 MG tablet   Chronic pain of both shoulders    Refill pain med Uds / contract utd Database reviewed       Relevant Medications   HYDROcodone-acetaminophen (NORCO/VICODIN) 5-325 MG tablet   Other Visit Diagnoses     High risk medication use    -  Primary   Relevant Orders   Drug Monitoring Panel [phone] , Urine   Primary hypertension       Relevant Medications   losartan (COZAAR) 100 MG tablet   Other Relevant Orders   CBC with Differential/Platelet   Comprehensive metabolic panel   Lipid panel   Depression, major, single episode, moderate (HCC)       Relevant Orders   Drug Monitoring Panel [phone] , Urine   Smoker       Relevant Orders   DG Chest 2 View (Completed)   Colon cancer screening       Relevant Orders   Ambulatory referral to Gastroenterology   Weight gain       Relevant Orders   Thyroid Panel With TSH (Completed)  Meds ordered this encounter  Medications   DISCONTD: losartan (COZAAR) 50 MG tablet    Sig: Take 1 tablet (50 mg total) by mouth daily.    Dispense:  90 tablet    Refill:  1   HYDROcodone-acetaminophen (NORCO/VICODIN) 5-325 MG tablet    Sig: Take 1 tablet by mouth every 6 (six) hours as needed for moderate pain.    Dispense:  45 tablet    Refill:  0   losartan (COZAAR) 100 MG tablet    Sig: Take 1 tablet (100 mg total) by mouth daily.    Dispense:  90 tablet    Refill:  1    I, Donato Schultz, DO, personally preformed the services described in this documentation.  All medical record entries made by the scribe were at my direction and in my presence.  I have reviewed the chart and discharge instructions (if applicable) and agree that the record reflects my personal performance  and is accurate and complete. 09/26/2022.  I,Mathew Stumpf,acting as a Neurosurgeon for [employer], DO.,have documented all relevant documentation on the behalf of Donato Schultz, DO,as directed by  Donato Schultz, DO while in the presence of Donato Schultz, DO.   Donato Schultz, DO

## 2022-09-27 LAB — THYROID PANEL WITH TSH
Free Thyroxine Index: 1.7 (ref 1.4–3.8)
T3 Uptake: 34 % (ref 22–35)
T4, Total: 5.1 ug/dL (ref 4.9–10.5)
TSH: 2.53 mIU/L (ref 0.40–4.50)

## 2022-09-28 LAB — DM TEMPLATE

## 2022-09-28 NOTE — Assessment & Plan Note (Signed)
Refill pain med Uds / contract utd Database reviewed

## 2022-09-28 NOTE — Assessment & Plan Note (Signed)
Poorly controlled will alter medications, encouraged DASH diet, minimize caffeine and obtain adequate sleep. Report concerning symptoms and follow up as directed and as needed 

## 2022-09-28 NOTE — Assessment & Plan Note (Signed)
Uds / contract utd Database reviewed

## 2022-09-28 NOTE — Assessment & Plan Note (Signed)
Tolerating statin, encouraged heart healthy diet, avoid trans fats, minimize simple carbs and saturated fats. Increase exercise as tolerated 

## 2022-09-29 ENCOUNTER — Encounter: Payer: Self-pay | Admitting: Family Medicine

## 2022-09-29 DIAGNOSIS — K921 Melena: Secondary | ICD-10-CM

## 2022-09-29 LAB — DRUG MONITORING PANEL 376104, URINE
Amphetamines: NEGATIVE ng/mL (ref <500)
Barbiturates: NEGATIVE ng/mL (ref <300)
Benzodiazepines: NEGATIVE ng/mL (ref <100)
Cocaine Metabolite: NEGATIVE ng/mL (ref <150)
Codeine: NEGATIVE ng/mL (ref <50)
Desmethyltramadol: NEGATIVE ng/mL (ref <100)
Hydrocodone: 195 ng/mL — ABNORMAL HIGH (ref <50)
Hydromorphone: 95 ng/mL — ABNORMAL HIGH (ref <50)
Morphine: NEGATIVE ng/mL (ref <50)
Norhydrocodone: 180 ng/mL — ABNORMAL HIGH (ref <50)
Opiates: POSITIVE ng/mL — AB (ref <100)
Oxycodone: NEGATIVE ng/mL (ref <100)
Tramadol: NEGATIVE ng/mL (ref <100)

## 2022-10-03 ENCOUNTER — Encounter: Payer: Self-pay | Admitting: Gastroenterology

## 2022-10-16 ENCOUNTER — Other Ambulatory Visit: Payer: Self-pay | Admitting: Family Medicine

## 2022-10-16 DIAGNOSIS — G8929 Other chronic pain: Secondary | ICD-10-CM

## 2022-10-16 NOTE — Telephone Encounter (Signed)
Requesting: hydrocodone 5/325mg  Contract: Yes UDS: 09/26/22 Last Visit: 09/26/2022 Next Visit: 12/26/2022 Last Refill: 09/26/22  Please Advise

## 2022-10-17 ENCOUNTER — Other Ambulatory Visit: Payer: Self-pay | Admitting: Family Medicine

## 2022-10-17 DIAGNOSIS — G8929 Other chronic pain: Secondary | ICD-10-CM

## 2022-10-17 MED ORDER — HYDROCODONE-ACETAMINOPHEN 5-325 MG PO TABS
1.0000 | ORAL_TABLET | Freq: Four times a day (QID) | ORAL | 0 refills | Status: DC | PRN
Start: 2022-10-17 — End: 2022-11-06

## 2022-11-06 ENCOUNTER — Other Ambulatory Visit: Payer: Self-pay | Admitting: Family Medicine

## 2022-11-06 DIAGNOSIS — G8929 Other chronic pain: Secondary | ICD-10-CM

## 2022-11-06 MED ORDER — HYDROCODONE-ACETAMINOPHEN 5-325 MG PO TABS
1.0000 | ORAL_TABLET | Freq: Four times a day (QID) | ORAL | 0 refills | Status: DC | PRN
Start: 2022-11-06 — End: 2022-11-25

## 2022-11-06 NOTE — Telephone Encounter (Signed)
Requesting: hydrocodone 5-325mg   Contract: 04/17/22 UDS: 09/26/22 Last Visit: 09/26/22 Next Visit: 11/07/22  Last Refill: 10/17/22 #45 and 0RF  Please Advise

## 2022-11-07 ENCOUNTER — Encounter: Payer: Self-pay | Admitting: Family Medicine

## 2022-11-07 ENCOUNTER — Ambulatory Visit (INDEPENDENT_AMBULATORY_CARE_PROVIDER_SITE_OTHER): Payer: 59 | Admitting: Family Medicine

## 2022-11-07 VITALS — BP 118/78 | HR 73 | Temp 98.6°F | Resp 18 | Ht 67.5 in | Wt 288.6 lb

## 2022-11-07 DIAGNOSIS — N5089 Other specified disorders of the male genital organs: Secondary | ICD-10-CM | POA: Insufficient documentation

## 2022-11-07 DIAGNOSIS — R39198 Other difficulties with micturition: Secondary | ICD-10-CM | POA: Insufficient documentation

## 2022-11-07 LAB — POC URINALSYSI DIPSTICK (AUTOMATED)
Bilirubin, UA: NEGATIVE
Blood, UA: NEGATIVE
Glucose, UA: NEGATIVE
Ketones, UA: NEGATIVE
Leukocytes, UA: NEGATIVE
Nitrite, UA: NEGATIVE
Protein, UA: NEGATIVE
Spec Grav, UA: <=1.005 — AB (ref 1.010–1.025)
Urobilinogen, UA: 0.2 E.U./dL
pH, UA: 7 (ref 5.0–8.0)

## 2022-11-07 NOTE — Progress Notes (Signed)
Established Patient Office Visit  Subjective   Patient ID: Jonathan Hansen, male    DOB: 04-22-1975  Age: 47 y.o. MRN: [id number]  Chief Complaint  Patient presents with   Testicle Pain    Left testicle swelling and some pain, started last weekend, no discharge or bleeding.     HPI Discussed the use of AI scribe software for clinical note transcription with the patient, who gave verbal consent to proceed.  History of Present Illness   The patient, with a history of vasectomy, presents with left testicular swelling, 'about twice the size of my son's chicken egg.' He has had intermittent testicular pain and swelling since the vasectomy, which he attributed to scar tissue. He also reports difficulty initiating urination, which he has had 'for a while.' He denies any other urinary symptoms.      Patient Active Problem List   Diagnosis Date Noted   Scrotal mass 11/07/2022   Decreased urine stream 11/07/2022   Exposure to toxic chemical 09/26/2022   Preventative health care 12/11/2021   Chronic pain of both shoulders 03/01/2021   Anxiety 07/06/2020   Dysuria 11/08/2019   Primary osteoarthritis of both knees 07/06/2019   HLD (hyperlipidemia) 06/01/2018   Morbid obesity (HCC) 05/19/2017   Essential hypertension 02/17/2016   Chronic pain of both knees 02/17/2016   Chest pain 02/17/2016   Past Medical History:  Diagnosis Date   Arthritis    Back pain    Chest pain    Chronic knee pain    Depression    Dyspnea    Elevated liver enzymes    Fatty liver    HLD (hyperlipidemia)    HTN (hypertension)    Joint pain    Leg edema    Osteoarthritis    Pre-diabetes    Vitamin D deficiency    Past Surgical History:  Procedure Laterality Date   KNEE ARTHROSCOPY Right    VASECTOMY     Social History   Tobacco Use   Smoking status: Former    Types: Cigarettes   Smokeless tobacco: Current    Types: Chew   Tobacco comments:    now vape no cigarettes  Vaping Use   Vaping  status: Every Day   Substances: Nicotine  Substance Use Topics   Alcohol use: Not Currently    Comment: nothing in months 06/22/2018   Drug use: Never   Social History   Socioeconomic History   Marital status: Married    Spouse name: April Poyer   Number of children: 1   Years of education: Not on file   Highest education level: 12th grade  Occupational History   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  Tobacco Use   Smoking status: Former    Types: Cigarettes   Smokeless tobacco: Current    Types: Chew   Tobacco comments:    now vape no cigarettes  Vaping Use   Vaping status: Every Day   Substances: Nicotine  Substance and Sexual Activity   Alcohol use: Not Currently    Comment: nothing in months 06/22/2018   Drug use: Never   Sexual activity: Yes    Partners: Female  Other Topics Concern   Not on file  Social History Narrative   Not on file   Social Determinants of Health   Financial Resource Strain: Low Risk  (11/06/2022)   Overall Financial Resource Strain (CARDIA)    Difficulty of Paying Living Expenses: Not very hard  Food Insecurity: No Food Insecurity (11/06/2022)  Hunger Vital Sign    Worried About Running Out of Food in the Last Year: Never true    Ran Out of Food in the Last Year: Never true  Transportation Needs: No Transportation Needs (11/06/2022)   PRAPARE - Administrator, Civil Service (Medical): No    Lack of Transportation (Non-Medical): No  Physical Activity: Insufficiently Active (11/06/2022)   Exercise Vital Sign    Days of Exercise per Week: 1 day    Minutes of Exercise per Session: 10 min  Stress: No Stress Concern Present (11/06/2022)   [employer] of Occupational Health - Occupational Stress Questionnaire    Feeling of Stress : Only a little  Social Connections: Moderately Isolated (11/06/2022)   Social Connection and Isolation Panel [NHANES]    Frequency of Communication with Friends and Family: Three times a week     Frequency of Social Gatherings with Friends and Family: Patient declined    Attends Religious Services: Never    Database administrator or Organizations: No    Attends Engineer, structural: Not on file    Marital Status: Married  Catering manager Violence: Not on file   Family Status  Relation Name Status   Mother  Alive   Father  Deceased at age 98   PGF  Deceased at age 66       MI,  bone cancer   Mat Uncle  Deceased at age 28       MI   Mat Uncle  Deceased at age 9       MI  No partnership data on file   Family History  Problem Relation Age of Onset   COPD Mother    Lung cancer Mother    Stroke Father    Coronary artery disease Father        carotid stenosis   Diabetes Father    Diabetes Paternal Grandfather    Allergies  Allergen Reactions   Ibuprofen Other (See Comments)    Bleeding   Penicillins       Review of Systems  Constitutional:  Negative for fever and malaise/fatigue.  HENT:  Negative for congestion.   Eyes:  Negative for blurred vision.  Respiratory:  Negative for cough and shortness of breath.   Cardiovascular:  Negative for chest pain, palpitations and leg swelling.  Gastrointestinal:  Negative for vomiting.  Genitourinary:        Weak urine stream  Musculoskeletal:  Negative for back pain.  Skin:  Negative for rash.  Neurological:  Negative for loss of consciousness and headaches.      Objective:     BP 118/78 (BP Location: Left Arm, Patient Position: Sitting, Cuff Size: Large)   Pulse 73   Temp 98.6 F (37 C) (Oral)   Resp 18   Ht 5' 7.5" (1.715 m)   Wt 288 lb 9.6 oz (130.9 kg)   SpO2 96%   BMI 44.53 kg/m  BP Readings from Last 3 Encounters:  11/07/22 118/78  09/26/22 (!) 153/91  06/20/22 (!) 160/98   Wt Readings from Last 3 Encounters:  11/07/22 288 lb 9.6 oz (130.9 kg)  09/26/22 295 lb 9.6 oz (134.1 kg)  06/20/22 292 lb 6.4 oz (132.6 kg)   SpO2 Readings from Last 3 Encounters:  11/07/22 96%  09/26/22 97%   06/20/22 96%      Physical Exam Vitals and nursing note reviewed.  Constitutional:      General: He is not in acute distress.  Appearance: Normal appearance. He is well-developed.  HENT:     Head: Normocephalic and atraumatic.  Eyes:     General: No scleral icterus.       Right eye: No discharge.        Left eye: No discharge.  Cardiovascular:     Rate and Rhythm: Normal rate and regular rhythm.     Heart sounds: No murmur heard. Pulmonary:     Effort: Pulmonary effort is normal. No respiratory distress.     Breath sounds: Normal breath sounds.  Genitourinary:    Testes:        Right: Mass, tenderness, swelling, testicular hydrocele or varicocele not present. Right testis is descended. Cremasteric reflex is present.         Left: Tenderness, swelling and testicular hydrocele present.     Prostate: Normal.  Musculoskeletal:        General: Normal range of motion.     Cervical back: Normal range of motion and neck supple.     Right lower leg: No edema.     Left lower leg: No edema.  Skin:    General: Skin is warm and dry.  Neurological:     Mental Status: He is alert and oriented to person, place, and time.  Psychiatric:        Mood and Affect: Mood normal.        Behavior: Behavior normal.        Thought Content: Thought content normal.        Judgment: Judgment normal.      Results for orders placed or performed in visit on 11/07/22  POCT Urinalysis Dipstick (Automated)  Result Value Ref Range   Color, UA yellow    Clarity, UA clear    Glucose, UA Negative Negative   Bilirubin, UA negative    Ketones, UA negative    Spec Grav, UA <=1.005 (A) 1.010 - 1.025   Blood, UA negative    pH, UA 7.0 5.0 - 8.0   Protein, UA Negative Negative   Urobilinogen, UA 0.2 0.2 or 1.0 E.U./dL   Nitrite, UA negative    Leukocytes, UA Negative Negative    Last CBC Lab Results  Component Value Date   WBC 6.5 12/10/2021   HGB 15.6 12/10/2021   HCT 45.1 12/10/2021   MCV  89.1 12/10/2021   MCH 31.5 08/13/2017   RDW 14.0 12/10/2021   PLT 217.0 12/10/2021   Last metabolic panel Lab Results  Component Value Date   GLUCOSE 90 12/10/2021   NA 137 12/10/2021   K 4.0 12/10/2021   CL 100 12/10/2021   CO2 27 12/10/2021   BUN 12 12/10/2021   CREATININE 0.88 12/10/2021   GFR 101.62 12/10/2021   CALCIUM 9.4 12/10/2021   PROT 7.6 12/10/2021   ALBUMIN 4.4 12/10/2021   LABGLOB 2.6 01/12/2018   AGRATIO 1.7 01/12/2018   BILITOT 0.7 12/10/2021   ALKPHOS 49 12/10/2021   AST 24 12/10/2021   ALT 37 12/10/2021   Last lipids Lab Results  Component Value Date   CHOL 189 12/10/2021   HDL 47.80 12/10/2021   LDLCALC 58 05/19/2019   LDLDIRECT 64.0 12/10/2021   TRIG 266.0 (H) 12/10/2021   CHOLHDL 4 12/10/2021   Last hemoglobin A1c Lab Results  Component Value Date   HGBA1C 5.4 05/19/2019   Last thyroid functions Lab Results  Component Value Date   TSH 2.53 09/26/2022   T3TOTAL 105 08/13/2017   T4TOTAL 5.1 09/26/2022   Last vitamin  D Lab Results  Component Value Date   VD25OH 16.6 (L) 01/12/2018   Last vitamin B12 and Folate Lab Results  Component Value Date   VITAMINB12 525 08/13/2017   FOLATE >20.0 08/13/2017      The 10-year ASCVD risk score (Arnett DK, et al., 2019) is: 3.1%    Assessment & Plan:   Problem List Items Addressed This Visit       Unprioritized   Scrotal mass - Primary    Refer to urology US scrotum ? hydrocele       Relevant Orders   US SCROTUM W/DOPPLER   Ambulatory referral to Urology   PSA   Decreased urine stream   Relevant Orders   Ambulatory referral to Urology   POCT Urinalysis Dipstick (Automated) (Completed)   PSA    No follow-ups on file.    Donato Schultz, DO

## 2022-11-07 NOTE — Assessment & Plan Note (Signed)
Refer to urology US scrotum ? hydrocele

## 2022-11-08 LAB — PSA: PSA: 0.89 ng/mL (ref <=4.00)

## 2022-11-10 ENCOUNTER — Ambulatory Visit (HOSPITAL_BASED_OUTPATIENT_CLINIC_OR_DEPARTMENT_OTHER)
Admission: RE | Admit: 2022-11-10 | Discharge: 2022-11-10 | Disposition: A | Discharge status: Home / Self Care | Payer: 59 | Source: Ambulatory Visit | Attending: Family Medicine | Admitting: Family Medicine

## 2022-11-10 DIAGNOSIS — N5089 Other specified disorders of the male genital organs: Secondary | ICD-10-CM | POA: Insufficient documentation

## 2022-11-14 ENCOUNTER — Ambulatory Visit (AMBULATORY_SURGERY_CENTER): Payer: 59 | Admitting: *Deleted

## 2022-11-14 VITALS — Ht 67.5 in | Wt 280.0 lb

## 2022-11-14 DIAGNOSIS — Z1211 Encounter for screening for malignant neoplasm of colon: Secondary | ICD-10-CM

## 2022-11-14 MED ORDER — NA SULFATE-K SULFATE-MG SULF 17.5-3.13-1.6 GM/177ML PO SOLN
1.0000 | Freq: Once | ORAL | 0 refills | Status: AC
Start: 2022-11-14 — End: 2022-11-14

## 2022-11-14 NOTE — Progress Notes (Signed)
Pt's name and DOB verified at the beginning of the pre-visit.  Pt denies any difficulty with ambulating,sitting, laying down or rolling side to side Gave both [facility] main # and MD on call # prior to instructions.  No egg or soy allergy known to patient  No issues known to pt with past sedation with any surgeries or procedures Pt denies having issues being intubated Pt has no issues moving head neck or swallowing No FH of Malignant Hyperthermia Pt is not on diet pills Pt is not on home 02  Pt is not on blood thinners  Pt denies issues with constipation  Pt is not on dialysis Pt has  abnormal heart rhythms RBB Pt denies any upcoming cardiac testing Pt encouraged to use to use Singlecare or Goodrx to reduce cost  Patient's chart reviewed by Cathlyn Parsons CNRA prior to pre-visit and patient appropriate for the [facility].  Pre-visit completed and red dot placed by patient's name on their procedure day (on provider's schedule).  . Visit by phone Pt states weight is 280 lb Instructed pt why it is important to and  to call if they have any changes in health or new medications. Directed them to the # given and on instructions.   Pt states they will.  Instructions reviewed with pt and pt states understanding. Instructed to review again prior to procedure. Pt states they will.  Instructions sent by mail with coupon and by my chart

## 2022-11-24 ENCOUNTER — Encounter: Payer: Self-pay | Admitting: Gastroenterology

## 2022-11-24 ENCOUNTER — Ambulatory Visit (INDEPENDENT_AMBULATORY_CARE_PROVIDER_SITE_OTHER): Payer: 59 | Admitting: Urology

## 2022-11-24 ENCOUNTER — Encounter: Payer: Self-pay | Admitting: Urology

## 2022-11-24 VITALS — BP 124/79 | HR 80 | Ht 68.0 in | Wt 280.0 lb

## 2022-11-24 DIAGNOSIS — N433 Hydrocele, unspecified: Secondary | ICD-10-CM | POA: Insufficient documentation

## 2022-11-24 DIAGNOSIS — R339 Retention of urine, unspecified: Secondary | ICD-10-CM

## 2022-11-24 LAB — BLADDER SCAN AMB NON-IMAGING

## 2022-11-24 MED ORDER — ALFUZOSIN HCL ER 10 MG PO TB24: 10.0000 mg | ORAL_TABLET | Freq: Every day | ORAL | 11 refills | Status: DC

## 2022-11-24 NOTE — Progress Notes (Signed)
Assessment: 1. Hydrocele in adult   2. Incomplete bladder emptying     Plan: I personally reviewed the patient's chart including provider notes, labs and imaging results. I discussed the diagnosis and management of a hydrocele with the patient today.  I reassured him that this is a benign condition.  I discussed surgical management with hydrocelectomy.  At the present time, he would like to monitor the hydrocele. I discussed options for management of his lower urinary tract symptoms.  I recommended a trial of alpha-blocker therapy to see if this improves his urinary symptoms and his bladder emptying. Trial of alfuzosin 10 mg daily.  Prescription sent. Return to office in 1 month.  Chief Complaint:  Chief Complaint  Patient presents with   Hydrocele    History of Present Illness:  Jonathan Hansen is a 47 y.o. male who is seen in consultation from Zola Button, Myrene Buddy R, DO for evaluation of left scrotal swelling and difficulty voiding. He reports left-sided scrotal enlargement began being approximately 2-3 weeks ago.  No history of scrotal trauma.  No scrotal pain.  No change in size with supine position. Scrotal ultrasound from 11/11/2022 showed normal testicles bilaterally and a large left hydrocele with a small right hydrocele.  He has symptoms of intermittent stream, hesitancy, and decreased force of stream as well as nocturia x 1.  No dysuria or gross hematuria.  His urinary symptoms began approximately 2-3 weeks ago as well. IPSS = 21 QOL = 4 today.  PSA 7/24: 0.89  Past Medical History:  Past Medical History:  Diagnosis Date   Arthritis    Back pain    Chest pain    Chronic knee pain    Depression    Dyspnea    Elevated liver enzymes    Fatty liver    HLD (hyperlipidemia)    HTN (hypertension)    Joint pain    Leg edema    Osteoarthritis    Pre-diabetes    Vitamin D deficiency     Past Surgical History:  Past Surgical History:  Procedure Laterality Date    KNEE ARTHROSCOPY Right    VASECTOMY      Allergies:  Allergies  Allergen Reactions   Ibuprofen Other (See Comments)    Bleeding   Penicillins     Family History:  Family History  Problem Relation Age of Onset   COPD Mother    Lung cancer Mother    Stroke Father    Coronary artery disease Father        carotid stenosis   Diabetes Father    Diabetes Paternal Grandfather    Colon cancer Neg Hx    Colon polyps Neg Hx    Esophageal cancer Neg Hx    Stomach cancer Neg Hx    Rectal cancer Neg Hx     Social History:  Social History   Tobacco Use   Smoking status: Former    Types: Cigarettes   Smokeless tobacco: Current    Types: Chew   Tobacco comments:    now vape no cigarettes  Vaping Use   Vaping status: Every Day   Substances: Nicotine  Substance Use Topics   Alcohol use: Not Currently    Comment: nothing in months 06/22/2018   Drug use: Never    Review of symptoms:  Constitutional:  Negative for unexplained weight loss, night sweats, fever, chills ENT:  Negative for nose bleeds, sinus pain, painful swallowing CV:  Negative for chest pain, shortness of breath,  exercise intolerance, palpitations, loss of consciousness Resp:  Negative for cough, wheezing, shortness of breath GI:  Negative for nausea, vomiting, diarrhea, bloody stools GU:  Positives noted in HPI; otherwise negative for gross hematuria, dysuria, urinary incontinence Neuro:  Negative for seizures, poor balance, limb weakness, slurred speech Psych:  Negative for lack of energy, depression, anxiety Endocrine:  Negative for polydipsia, polyuria, symptoms of hypoglycemia (dizziness, hunger, sweating) Hematologic:  Negative for anemia, purpura, petechia, prolonged or excessive bleeding, use of anticoagulants  Allergic:  Negative for difficulty breathing or choking as a result of exposure to anything; no shellfish allergy; no allergic response (rash/itch) to materials, foods  Physical exam: BP 124/79    Pulse 80   Ht 5\' 8"  (1.727 m)   Wt 280 lb (127 kg)   BMI 42.57 kg/m  GENERAL APPEARANCE:  Well appearing, well developed, well nourished, NAD HEENT: Atraumatic, Normocephalic, oropharynx clear. NECK: Supple without lymphadenopathy or thyromegaly. LUNGS: Clear to auscultation bilaterally. HEART: Regular Rate and Rhythm without murmurs, gallops, or rubs. ABDOMEN: Soft, non-tender, No Masses. EXTREMITIES: Moves all extremities well.  Without clubbing, cyanosis, or edema. NEUROLOGIC:  Alert and oriented x 3, normal gait, CN II-XII grossly intact.  MENTAL STATUS:  Appropriate. BACK:  Non-tender to palpation.  No CVAT SKIN:  Warm, dry and intact.   GU: Penis:  circumcised Meatus: Normal Scrotum: enlarged left scrotum consistent with hydrocele Testis: right normal; unable to palpate left due to hydrocele Prostate: 30 g, NT, no nodules Rectum: Normal tone,  no masses or tenderness   Results: U/A: negative  PVR:  284 ml

## 2022-11-25 ENCOUNTER — Telehealth: Payer: Self-pay | Admitting: Family Medicine

## 2022-11-25 DIAGNOSIS — G8929 Other chronic pain: Secondary | ICD-10-CM

## 2022-11-27 ENCOUNTER — Other Ambulatory Visit: Payer: Self-pay | Admitting: Family Medicine

## 2022-11-27 ENCOUNTER — Other Ambulatory Visit (HOSPITAL_BASED_OUTPATIENT_CLINIC_OR_DEPARTMENT_OTHER): Payer: Self-pay

## 2022-11-27 DIAGNOSIS — G8929 Other chronic pain: Secondary | ICD-10-CM

## 2022-11-27 MED ORDER — HYDROCODONE-ACETAMINOPHEN 5-325 MG PO TABS
1.0000 | ORAL_TABLET | Freq: Four times a day (QID) | ORAL | 0 refills | Status: DC | PRN
Start: 2022-11-27 — End: 2022-11-27

## 2022-11-27 MED ORDER — HYDROCODONE-ACETAMINOPHEN 5-325 MG PO TABS
1.0000 | ORAL_TABLET | Freq: Four times a day (QID) | ORAL | 0 refills | Status: DC | PRN
Start: 2022-11-27 — End: 2022-12-18
  Filled 2022-11-27: qty 45, 12d supply, fill #0

## 2022-11-27 NOTE — Telephone Encounter (Signed)
Pt made aware

## 2022-11-27 NOTE — Telephone Encounter (Signed)
Requesting: HYDROcodone-acetaminophen (NORCO/VICODIN) 5-325 MG tablet  Sig: Take 1 tablet by mouth every 6 (six) hours as needed for moderate pain.  Contract: 03/21/2022 UDS: 09/26/2022 Last Visit: 11/07/2022 Next Visit: 12/26/2022 Last Refill: 11/06/2022 #45 no refills  Please Advise

## 2022-11-27 NOTE — Telephone Encounter (Signed)
Pt states [facility] only [facility] 10 mg or he could get HYDROcodone-acetaminophen (NORCO/VICODIN) 5-325 MG tablet from downstairs. He states whichever is easier.

## 2022-12-04 ENCOUNTER — Ambulatory Visit (AMBULATORY_SURGERY_CENTER): Payer: 59 | Admitting: Gastroenterology

## 2022-12-04 ENCOUNTER — Encounter: Payer: Self-pay | Admitting: Gastroenterology

## 2022-12-04 ENCOUNTER — Encounter (INDEPENDENT_AMBULATORY_CARE_PROVIDER_SITE_OTHER): Payer: Self-pay

## 2022-12-04 VITALS — BP 137/81 | HR 73 | Resp 10 | Ht 67.5 in | Wt 280.0 lb

## 2022-12-04 DIAGNOSIS — Z1211 Encounter for screening for malignant neoplasm of colon: Secondary | ICD-10-CM

## 2022-12-04 MED ORDER — SODIUM CHLORIDE 0.9 % IV SOLN: 500.0000 mL | Freq: Once | INTRAVENOUS | Status: DC

## 2022-12-04 NOTE — Progress Notes (Signed)
Pt's states no medical or surgical changes since previsit or office visit. 

## 2022-12-04 NOTE — Op Note (Signed)
[facility] Patient Name: Jonathan Hansen Procedure Date: 12/04/2022 8:31 AM MRN: [id number] Endoscopist: Sherilyn Cooter L. Myrtie Neither , MD, [id number] Age: 47 Referring MD:  Date of Birth: 1975/10/23 Gender: Male Account #: [id number] Procedure:                Colonoscopy Indications:              Screening for colorectal malignant neoplasm, This                            is the patient's first colonoscopy Medicines:                Monitored Anesthesia Care Procedure:                Pre-Anesthesia Assessment:                           - Prior to the procedure, a History and Physical                            was performed, and patient medications and                            allergies were reviewed. The patient's tolerance of                            previous anesthesia was also reviewed. The risks                            and benefits of the procedure and the sedation                            options and risks were discussed with the patient.                            All questions were answered, and informed consent                            was obtained. Prior Anticoagulants: The patient has                            taken no anticoagulant or antiplatelet agents. ASA                            Grade Assessment: III - A patient with severe                            systemic disease. After reviewing the risks and                            benefits, the patient was deemed in satisfactory                            condition to undergo the procedure.  After obtaining informed consent, the colonoscope                            was passed under direct vision. Throughout the                            procedure, the patient's blood pressure, pulse, and                            oxygen saturations were monitored continuously. The                            CF [id number] #[id number] was introduced through the anus                            and advanced to  the the cecum, identified by                            appendiceal orifice and ileocecal valve. The                            colonoscopy was performed without difficulty. The                            patient tolerated the procedure well. The quality                            of the bowel preparation was good. The ileocecal                            valve, appendiceal orifice, and rectum were                            photographed. Scope In: 8:47:00 AM Scope Out: 9:00:44 AM Scope Withdrawal Time: 0 hours 8 minutes 49 seconds  Total Procedure Duration: 0 hours 13 minutes 44 seconds  Findings:                 The perianal and digital rectal examinations were                            normal.                           Repeat examination of right colon under NBI                            performed.                           A few small-mouthed diverticula were found in the                            left colon.  Internal hemorrhoids were found. The hemorrhoids                            were small.                           The exam was otherwise without abnormality on                            direct and retroflexion views. Complications:            No immediate complications. Estimated Blood Loss:     Estimated blood loss: none. Impression:               - Diverticulosis in the left colon.                           - Internal hemorrhoids.                           - The examination was otherwise normal on direct                            and retroflexion views.                           - No specimens collected. Recommendation:           - Patient has a contact number available for                            emergencies. The signs and symptoms of potential                            delayed complications were discussed with the                            patient. Return to normal activities tomorrow.                            Written discharge  instructions were provided to the                            patient.                           - Resume previous diet.                           - Continue present medications.                           - Repeat colonoscopy in 10 years for screening                            purposes. Jolene Guyett L. Myrtie Neither, MD 12/04/2022 9:06:56 AM This report has been signed electronically.

## 2022-12-04 NOTE — Progress Notes (Signed)
Uneventful anesthetic. Report to pacu rn. Vss. Care resumed by rn. 

## 2022-12-04 NOTE — Progress Notes (Signed)
History and Physical:  This patient presents for endoscopic testing for: Encounter Diagnosis  Name Primary?   Special screening for malignant neoplasms, colon Yes    Average risk for colorectal cancer.  First screening exam.  Patient denies chronic abdominal pain Has intermittent rectal bleeding (seen in 2020 for this), and irregular BMs   Patient is otherwise without complaints or active issues today.   Past Medical History: Past Medical History:  Diagnosis Date   Arthritis    Back pain    Chest pain    Chronic knee pain    Depression    Dyspnea    Elevated liver enzymes    Fatty liver    HLD (hyperlipidemia)    HTN (hypertension)    Joint pain    Leg edema    Osteoarthritis    Pre-diabetes    Vitamin D deficiency      Past Surgical History: Past Surgical History:  Procedure Laterality Date   KNEE ARTHROSCOPY Right    VASECTOMY      Allergies: Allergies  Allergen Reactions   Ibuprofen Other (See Comments)    Bleeding   Penicillins     Outpatient Meds: Current Outpatient Medications  Medication Sig Dispense Refill   alfuzosin (UROXATRAL) 10 MG 24 hr tablet Take 1 tablet (10 mg total) by mouth daily. 30 tablet 11   HYDROcodone-acetaminophen (NORCO/VICODIN) 5-325 MG tablet Take 1 tablet by mouth every 6 (six) hours as needed for moderate pain. 45 tablet 0   losartan (COZAAR) 100 MG tablet Take 1 tablet (100 mg total) by mouth daily. 90 tablet 1   sertraline (ZOLOFT) 100 MG tablet Take 1 tablet (100 mg total) by mouth daily. 90 tablet 3   Current Facility-Administered Medications  Medication Dose Route Frequency Provider Last Rate Last Admin   0.9 %  sodium chloride infusion  500 mL Intravenous Once Charlie Pitter III, MD          ___________________________________________________________________ Objective   Exam:  BP (!) 142/77   Pulse 75   Ht 5' 7.5" (1.715 m)   Wt 280 lb (127 kg)   SpO2 97%   BMI 43.21 kg/m   CV: regular , S1/S2 Resp:  clear to auscultation bilaterally, normal RR and effort noted GI: soft, no tenderness, with active bowel sounds.   Assessment: Encounter Diagnosis  Name Primary?   Special screening for malignant neoplasms, colon Yes     Plan: Colonoscopy   The benefits and risks of the planned procedure were described in detail with the patient or (when appropriate) their health care proxy.  Risks were outlined as including, but not limited to, bleeding, infection, perforation, adverse medication reaction leading to cardiac or pulmonary decompensation, pancreatitis (if ERCP).  The limitation of incomplete mucosal visualization was also discussed.  No guarantees or warranties were given.  The patient is appropriate for an endoscopic procedure in the ambulatory setting.   - Amada Jupiter, MD

## 2022-12-04 NOTE — Patient Instructions (Addendum)
Thank you for letting us take care of your healthcare needs today. Please see handouts given to you on Diverticulosis and Hemorrhoids.    YOU HAD AN ENDOSCOPIC PROCEDURE TODAY AT THE [facility]:   Refer to the procedure report that was given to you for any specific questions about what was found during the examination.  If the procedure report does not answer your questions, please call your gastroenterologist to clarify.  If you requested that your care partner not be given the details of your procedure findings, then the procedure report has been included in a sealed envelope for you to review at your convenience later.  YOU SHOULD EXPECT: Some feelings of bloating in the abdomen. Passage of more gas than usual.  Walking can help get rid of the air that was put into your GI tract during the procedure and reduce the bloating. If you had a lower endoscopy (such as a colonoscopy or flexible sigmoidoscopy) you may notice spotting of blood in your stool or on the toilet paper. If you underwent a bowel prep for your procedure, you may not have a normal bowel movement for a few days.  Please Note:  You might notice some irritation and congestion in your nose or some drainage.  This is from the oxygen used during your procedure.  There is no need for concern and it should clear up in a day or so.  SYMPTOMS TO REPORT IMMEDIATELY:  Following lower endoscopy (colonoscopy or flexible sigmoidoscopy):  Excessive amounts of blood in the stool  Significant tenderness or worsening of abdominal pains  Swelling of the abdomen that is new, acute  Fever of 100F or higher   For urgent or emergent issues, a gastroenterologist can be reached at any hour by calling [phone]. Do not use MyChart messaging for urgent concerns.    DIET:  We do recommend a small meal at first, but then you may proceed to your regular diet.  Drink plenty of fluids but you should avoid alcoholic beverages for 24  hours.  ACTIVITY:  You should plan to take it easy for the rest of today and you should NOT DRIVE or use heavy machinery until tomorrow (because of the sedation medicines used during the test).    FOLLOW UP: Our staff will call the number listed on your records the next business day following your procedure.  We will call around 7:15- 8:00 am to check on you and address any questions or concerns that you may have regarding the information given to you following your procedure. If we do not reach you, we will leave a message.     If any biopsies were taken you will be contacted by phone or by letter within the next 1-3 weeks.  Please call us at [phone] if you have not heard about the biopsies in 3 weeks.    SIGNATURES/CONFIDENTIALITY: You and/or your care partner have signed paperwork which will be entered into your electronic medical record.  These signatures attest to the fact that that the information above on your After Visit Summary has been reviewed and is understood.  Full responsibility of the confidentiality of this discharge information lies with you and/or your care-partner. 

## 2022-12-05 ENCOUNTER — Telehealth: Payer: Self-pay

## 2022-12-05 NOTE — Telephone Encounter (Signed)
  Follow up Call-     12/04/2022    8:12 AM  Call back number  Post procedure Call Back phone  # [phone]  Permission to leave phone message Yes     Patient questions:  Do you have a fever, pain , or abdominal swelling? No. Pain Score  0 *  Have you tolerated food without any problems? Yes.    Have you been able to return to your normal activities? Yes.    Do you have any questions about your discharge instructions: Diet   No. Medications  No. Follow up visit  No.  Do you have questions or concerns about your Care? No.  Actions: * If pain score is 4 or above: No action needed, pain <4.

## 2022-12-18 ENCOUNTER — Other Ambulatory Visit: Payer: Self-pay | Admitting: Family Medicine

## 2022-12-18 DIAGNOSIS — G8929 Other chronic pain: Secondary | ICD-10-CM

## 2022-12-18 NOTE — Telephone Encounter (Signed)
Requesting: Hydrocodone 5-325mg   Contract:04/17/22 UDS:09/26/22 Last Visit: 11/07/22 Next Visit: 12/26/22 Last Refill: 11/27/22 #45 and 1OX   Please Advise

## 2022-12-19 ENCOUNTER — Other Ambulatory Visit (HOSPITAL_BASED_OUTPATIENT_CLINIC_OR_DEPARTMENT_OTHER): Payer: Self-pay

## 2022-12-19 MED ORDER — HYDROCODONE-ACETAMINOPHEN 5-325 MG PO TABS
1.0000 | ORAL_TABLET | Freq: Four times a day (QID) | ORAL | 0 refills | Status: DC | PRN
Start: 2022-12-19 — End: 2023-01-08
  Filled 2022-12-19: qty 45, 12d supply, fill #0

## 2022-12-26 ENCOUNTER — Ambulatory Visit (INDEPENDENT_AMBULATORY_CARE_PROVIDER_SITE_OTHER): Payer: 59 | Admitting: Family Medicine

## 2022-12-26 ENCOUNTER — Encounter: Payer: Self-pay | Admitting: Family Medicine

## 2022-12-26 VITALS — BP 124/70 | HR 80 | Temp 98.5°F | Resp 18 | Ht 67.5 in | Wt 290.8 lb

## 2022-12-26 DIAGNOSIS — I10 Essential (primary) hypertension: Secondary | ICD-10-CM | POA: Diagnosis not present

## 2022-12-26 DIAGNOSIS — E785 Hyperlipidemia, unspecified: Secondary | ICD-10-CM | POA: Diagnosis not present

## 2022-12-26 DIAGNOSIS — F32A Depression, unspecified: Secondary | ICD-10-CM

## 2022-12-26 NOTE — Progress Notes (Signed)
Established Patient Office Visit  Subjective   Patient ID: Jonathan Hansen, male    DOB: July 03, 1975  Age: 47 y.o. MRN: [id number]  Chief Complaint  Patient presents with   Pain   Follow-up    HPI Discussed the use of AI scribe software for clinical note transcription with the patient, who gave verbal consent to proceed.  History of Present Illness   The patient presents for a follow-up after a recent colonoscopy. He reports a 'violent reaction' to the bowel prep, which was 'instantaneous' and resulted in prolonged time on the toilet. Despite the discomfort, the colonoscopy was completed and the results were normal, showing diverticulosis and internal hemorrhoids.  The patient also mentions a scheduled follow-up with a urologist, but does not elaborate on the reason for this. He is currently taking blood pressure medication and Zoloft, which he reports is helping. 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 He mentions a recent weight loss, but expresses frustration that he has been unable to maintain it due to dietary choices.      Patient Active Problem List   Diagnosis Date Noted   Hydrocele in adult 11/24/2022   Incomplete bladder emptying 11/24/2022   Scrotal mass 11/07/2022   Decreased urine stream 11/07/2022   Exposure to toxic chemical 09/26/2022   Preventative health care 12/11/2021   Chronic pain of both shoulders 03/01/2021   Anxiety 07/06/2020   Dysuria 11/08/2019   Primary osteoarthritis of both knees 07/06/2019   HLD (hyperlipidemia) 06/01/2018   Morbid obesity (HCC) 05/19/2017   Essential hypertension 02/17/2016   Chronic pain of both knees 02/17/2016   Chest pain 02/17/2016   Past Medical History:   Diagnosis Date   Arthritis    Back pain    Chest pain    Chronic knee pain    Depression    Dyspnea    Elevated liver enzymes    Fatty liver    HLD (hyperlipidemia)    HTN (hypertension)    Joint pain    Leg edema    Osteoarthritis    Pre-diabetes    Vitamin D deficiency    Past Surgical History:  Procedure Laterality Date   KNEE ARTHROSCOPY Right    VASECTOMY     Social History   Tobacco Use   Smoking status: Former    Types: Cigarettes   Smokeless tobacco: Current    Types: Chew   Tobacco comments:    now vape no cigarettes  Vaping Use   Vaping status: Every Day   Substances: Nicotine  Substance Use Topics   Alcohol use: Not Currently    Comment: nothing in months 06/22/2018   Drug use: Never   Social History   Socioeconomic History   Marital status: Married    Spouse name: April Latterell   Number of children: 1   Years of education: Not on file   Highest education level: 12th grade  Occupational History   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  Tobacco Use   Smoking status: Former    Types: Cigarettes   Smokeless tobacco: Current    Types: Chew   Tobacco comments:    now vape no cigarettes  Vaping Use   Vaping status: Every Day   Substances: Nicotine  Substance and Sexual Activity   Alcohol use: Not Currently    Comment: nothing in  months 06/22/2018   Drug use: Never   Sexual activity: Yes    Partners: Female  Other Topics Concern   Not on file  Social History Narrative   Not on file   Social Determinants of Health   Financial Resource Strain: Low Risk  (11/06/2022)   Overall Financial Resource Strain (CARDIA)    Difficulty of Paying Living Expenses: Not very hard  Food Insecurity: No Food Insecurity (11/06/2022)   Hunger Vital Sign    Worried About Running Out of Food in the Last Year: Never true    Ran Out of Food in the Last Year: Never true  Transportation Needs: No Transportation Needs (11/06/2022)   PRAPARE - Scientist, research (physical sciences) (Medical): No    Lack of Transportation (Non-Medical): No  Physical Activity: Insufficiently Active (11/06/2022)   Exercise Vital Sign    Days of Exercise per Week: 1 day    Minutes of Exercise per Session: 10 min  Stress: No Stress Concern Present (11/06/2022)   [employer] of Occupational Health - Occupational Stress Questionnaire    Feeling of Stress : Only a little  Social Connections: Moderately Isolated (11/06/2022)   Social Connection and Isolation Panel [NHANES]    Frequency of Communication with Friends and Family: Three times a week    Frequency of Social Gatherings with Friends and Family: Patient declined    Attends Religious Services: Never    Database administrator or Organizations: No    Attends Engineer, structural: Not on file    Marital Status: Married  Catering manager Violence: Not on file   Family Status  Relation Name Status   Mother  Alive   Father  Deceased at age 33   Mat Uncle  Deceased at age 57       MI   Mat Uncle  Deceased at age 17       MI   PGF  Deceased at age 55       MI,  bone cancer   Neg Hx  (Not Specified)  No partnership data on file   Family History  Problem Relation Age of Onset   COPD Mother    Lung cancer Mother    Stroke Father    Coronary artery disease Father        carotid stenosis   Diabetes Father    Diabetes Paternal Grandfather    Colon cancer Neg Hx    Colon polyps Neg Hx    Esophageal cancer Neg Hx    Stomach cancer Neg Hx    Rectal cancer Neg Hx    Allergies  Allergen Reactions   Ibuprofen Other (See Comments)    Bleeding   Penicillins       Review of Systems  Constitutional:  Negative for chills, fever and malaise/fatigue.  HENT:  Negative for congestion and hearing loss.   Eyes:  Negative for blurred vision and discharge.  Respiratory:  Negative for cough, sputum production and shortness of breath.   Cardiovascular:  Negative for chest pain, palpitations and leg swelling.   Gastrointestinal:  Negative for abdominal pain, blood in stool, constipation, diarrhea, heartburn, nausea and vomiting.  Genitourinary:  Negative for dysuria, frequency, hematuria and urgency.  Musculoskeletal:  Negative for back pain, falls and myalgias.  Skin:  Negative for rash.  Neurological:  Negative for dizziness, sensory change, loss of consciousness, weakness and headaches.  Endo/Heme/Allergies:  Negative for environmental allergies. Does not bruise/bleed easily.  Psychiatric/Behavioral:  Negative for depression and suicidal ideas. The patient is not nervous/anxious and does not have insomnia.       Objective:     BP 124/70 (BP Location: Right Arm, Patient Position: Sitting, Cuff Size: Large)   Pulse 80   Temp 98.5 F (36.9 C) (Oral)   Resp 18   Ht 5' 7.5" (1.715 m)   Wt 290 lb 12.8 oz (131.9 kg)   SpO2 97%   BMI 44.87 kg/m  BP Readings from Last 3 Encounters:  12/26/22 124/70  12/04/22 137/81  11/24/22 124/79   Wt Readings from Last 3 Encounters:  12/26/22 290 lb 12.8 oz (131.9 kg)  12/04/22 280 lb (127 kg)  11/24/22 280 lb (127 kg)   SpO2 Readings from Last 3 Encounters:  12/26/22 97%  12/04/22 97%  11/07/22 96%      Physical Exam Vitals and nursing note reviewed.  Constitutional:      General: He is not in acute distress.    Appearance: Normal appearance. He is well-developed.  HENT:     Head: Normocephalic and atraumatic.  Eyes:     General: No scleral icterus.       Right eye: No discharge.        Left eye: No discharge.  Cardiovascular:     Rate and Rhythm: Normal rate and regular rhythm.     Heart sounds: No murmur heard. Pulmonary:     Effort: Pulmonary effort is normal. No respiratory distress.     Breath sounds: Normal breath sounds.  Abdominal:     Tenderness: There is no abdominal tenderness. There is no right CVA tenderness, left CVA tenderness, guarding or rebound.  Musculoskeletal:        General: Normal range of motion.      Cervical back: Normal range of motion and neck supple.     Right lower leg: No edema.     Left lower leg: No edema.  Skin:    General: Skin is warm and dry.  Neurological:     Mental Status: He is alert and oriented to person, place, and time.  Psychiatric:        Mood and Affect: Mood normal.        Behavior: Behavior normal.        Thought Content: Thought content normal.        Judgment: Judgment normal.      No results found for any visits on 12/26/22.  Last CBC Lab Results  Component Value Date   WBC 6.5 12/10/2021   HGB 15.6 12/10/2021   HCT 45.1 12/10/2021   MCV 89.1 12/10/2021   MCH 31.5 08/13/2017   RDW 14.0 12/10/2021   PLT 217.0 12/10/2021   Last metabolic panel Lab Results  Component Value Date   GLUCOSE 90 12/10/2021   NA 137 12/10/2021   K 4.0 12/10/2021   CL 100 12/10/2021   CO2 27 12/10/2021   BUN 12 12/10/2021   CREATININE 0.88 12/10/2021   GFR 101.62 12/10/2021   CALCIUM 9.4 12/10/2021   PROT 7.6 12/10/2021   ALBUMIN 4.4 12/10/2021   LABGLOB 2.6 01/12/2018   AGRATIO 1.7 01/12/2018   BILITOT 0.7 12/10/2021   ALKPHOS 49 12/10/2021   AST 24 12/10/2021   ALT 37 12/10/2021   Last lipids Lab Results  Component Value Date   CHOL 189 12/10/2021   HDL 47.80 12/10/2021   LDLCALC 58 05/19/2019   LDLDIRECT 64.0 12/10/2021   TRIG 266.0 (H) 12/10/2021   CHOLHDL 4  12/10/2021   Last hemoglobin A1c Lab Results  Component Value Date   HGBA1C 5.4 05/19/2019   Last thyroid functions Lab Results  Component Value Date   TSH 2.53 09/26/2022   T3TOTAL 105 08/13/2017   T4TOTAL 5.1 09/26/2022   Last vitamin D Lab Results  Component Value Date   VD25OH 16.6 (L) 01/12/2018   Last vitamin B12 and Folate Lab Results  Component Value Date   VITAMINB12 525 08/13/2017   FOLATE >20.0 08/13/2017      The 10-year ASCVD risk score (Arnett DK, et al., 2019) is: 3.3%    Assessment & Plan:   Problem List Items Addressed This Visit   None Visit  Diagnoses     Primary hypertension    -  Primary   Relevant Orders   CBC with Differential/Platelet   Comprehensive metabolic panel   Lipid panel      Assessment and Plan    Colonoscopy Recent colonoscopy with findings of diverticulosis and internal hemorrhoids. No polyps or malignancy. Patient experienced a rapid and intense bowel prep response. -No further action required at this time. Next colonoscopy due in 10 years.  Hypertension Controlled on current medication. -Continue current blood pressure medication.  Depression Managed on Zoloft. Patient reports it is helping. -Continue Zoloft.  Hyperlipidemia Cholesterol levels not checked during last visit. -Order lipid panel today.  Follow-up Upcoming appointment with urologist. -No action required at this time.       Return in about 3 months (around 03/27/2023), or if symptoms worsen or fail to improve.    Donato Schultz, DO

## 2022-12-26 NOTE — Patient Instructions (Signed)

## 2022-12-31 ENCOUNTER — Other Ambulatory Visit: Payer: Self-pay | Admitting: *Deleted

## 2022-12-31 DIAGNOSIS — R739 Hyperglycemia, unspecified: Secondary | ICD-10-CM

## 2022-12-31 NOTE — Progress Notes (Signed)
.  hem

## 2023-01-01 ENCOUNTER — Other Ambulatory Visit: Payer: Self-pay | Admitting: Family Medicine

## 2023-01-01 DIAGNOSIS — I10 Essential (primary) hypertension: Secondary | ICD-10-CM

## 2023-01-02 ENCOUNTER — Encounter: Payer: Self-pay | Admitting: Urology

## 2023-01-02 ENCOUNTER — Other Ambulatory Visit: Payer: Self-pay | Admitting: Family Medicine

## 2023-01-02 ENCOUNTER — Ambulatory Visit (INDEPENDENT_AMBULATORY_CARE_PROVIDER_SITE_OTHER): Payer: 59 | Admitting: Urology

## 2023-01-02 ENCOUNTER — Other Ambulatory Visit (HOSPITAL_BASED_OUTPATIENT_CLINIC_OR_DEPARTMENT_OTHER): Payer: Self-pay

## 2023-01-02 VITALS — BP 133/84 | HR 73 | Ht 68.0 in | Wt 290.0 lb

## 2023-01-02 DIAGNOSIS — N433 Hydrocele, unspecified: Secondary | ICD-10-CM | POA: Diagnosis not present

## 2023-01-02 DIAGNOSIS — E785 Hyperlipidemia, unspecified: Secondary | ICD-10-CM

## 2023-01-02 DIAGNOSIS — R339 Retention of urine, unspecified: Secondary | ICD-10-CM | POA: Diagnosis not present

## 2023-01-02 DIAGNOSIS — I10 Essential (primary) hypertension: Secondary | ICD-10-CM

## 2023-01-02 DIAGNOSIS — R739 Hyperglycemia, unspecified: Secondary | ICD-10-CM

## 2023-01-02 LAB — COMPREHENSIVE METABOLIC PANEL
AG Ratio: 1.5 (calc) (ref 1.0–2.5)
ALT: 52 U/L — ABNORMAL HIGH (ref 9–46)
AST: 30 U/L (ref 10–40)
Albumin: 4.4 g/dL (ref 3.6–5.1)
Alkaline phosphatase (APISO): 42 U/L (ref 36–130)
BUN: 17 mg/dL (ref 7–25)
CO2: 24 mmol/L (ref 20–32)
Calcium: 9.1 mg/dL (ref 8.6–10.3)
Chloride: 101 mmol/L (ref 98–110)
Creat: 0.99 mg/dL (ref 0.60–1.29)
Globulin: 3 g/dL (ref 1.9–3.7)
Glucose, Bld: 199 mg/dL — ABNORMAL HIGH (ref 65–99)
Potassium: 4 mmol/L (ref 3.5–5.3)
Sodium: 135 mmol/L (ref 135–146)
Total Bilirubin: 0.6 mg/dL (ref 0.2–1.2)
Total Protein: 7.4 g/dL (ref 6.1–8.1)

## 2023-01-02 LAB — CBC WITH DIFFERENTIAL/PLATELET
Absolute Monocytes: 442 {cells}/uL (ref 200–950)
Basophils Absolute: 18 {cells}/uL (ref 0–200)
Basophils Relative: 0.4 %
Eosinophils Absolute: 78 {cells}/uL (ref 15–500)
Eosinophils Relative: 1.7 %
HCT: 42.2 % (ref 38.5–50.0)
Hemoglobin: 14.5 g/dL (ref 13.2–17.1)
Lymphs Abs: 1550 {cells}/uL (ref 850–3900)
MCH: 31 pg (ref 27.0–33.0)
MCHC: 34.4 g/dL (ref 32.0–36.0)
MCV: 90.4 fL (ref 80.0–100.0)
MPV: 10.6 fL (ref 7.5–12.5)
Monocytes Relative: 9.6 %
Neutro Abs: 2512 {cells}/uL (ref 1500–7800)
Neutrophils Relative %: 54.6 %
Platelets: 220 10*3/uL (ref 140–400)
RBC: 4.67 10*6/uL (ref 4.20–5.80)
RDW: 13.1 % (ref 11.0–15.0)
Total Lymphocyte: 33.7 %
WBC: 4.6 10*3/uL (ref 3.8–10.8)

## 2023-01-02 LAB — URINALYSIS, ROUTINE W REFLEX MICROSCOPIC
Bilirubin, UA: NEGATIVE
Glucose, UA: NEGATIVE
Ketones, UA: NEGATIVE
Leukocytes,UA: NEGATIVE
Nitrite, UA: NEGATIVE
Protein,UA: NEGATIVE
RBC, UA: NEGATIVE
Specific Gravity, UA: 1.02 (ref 1.005–1.030)
Urobilinogen, Ur: 0.2 mg/dL (ref 0.2–1.0)
pH, UA: 6.5 (ref 5.0–7.5)

## 2023-01-02 LAB — LIPID PANEL
Cholesterol: 183 mg/dL (ref <200)
HDL: 33 mg/dL — ABNORMAL LOW (ref >=40)
Non-HDL Cholesterol (Calc): 150 mg/dL — ABNORMAL HIGH (ref <130)
Total CHOL/HDL Ratio: 5.5 (calc) — ABNORMAL HIGH (ref <5.0)
Triglycerides: 588 mg/dL — ABNORMAL HIGH (ref <150)

## 2023-01-02 LAB — BLADDER SCAN AMB NON-IMAGING

## 2023-01-02 LAB — TEST AUTHORIZATION

## 2023-01-02 LAB — HEMOGLOBIN A1C
Hgb A1c MFr Bld: 6.1 %{Hb} — ABNORMAL HIGH (ref <5.7)
Mean Plasma Glucose: 128 mg/dL
eAG (mmol/L): 7.1 mmol/L

## 2023-01-02 MED ORDER — TAMSULOSIN HCL 0.4 MG PO CAPS
0.4000 mg | ORAL_CAPSULE | Freq: Every day | ORAL | 11 refills | Status: AC
Start: 2023-01-02 — End: ?
  Filled 2023-01-02: qty 30, 30d supply, fill #0
  Filled 2023-01-27: qty 30, 30d supply, fill #1

## 2023-01-02 NOTE — Progress Notes (Signed)
Assessment: 1. Incomplete bladder emptying   2. Hydrocele in adult     Plan: I discussed options for management of his lower urinary tract symptoms including further evaluation with cystoscopy. Trial of tamsulosin 0.4 daily.  Prescription sent. Call with results in 3-4 weeks  Chief Complaint:  Chief Complaint  Patient presents with   Incomplete bladder emptying    History of Present Illness:  Jonathan Hansen is a 47 y.o. male who is seen for further evaluation of left scrotal swelling and difficulty voiding. At his initial visit in August 2024, he reported left-sided scrotal enlargement beginning approximately 2-3 weeks prior.  No history of scrotal trauma.  No scrotal pain.  No change in size with supine position. Scrotal ultrasound from 11/11/2022 showed normal testicles bilaterally and a large left hydrocele with a small right hydrocele.  He reported symptoms of intermittent stream, hesitancy, and decreased force of stream as well as nocturia x 1.  No dysuria or gross hematuria.  His urinary symptoms began approximately 2-3 weeks prior to his visit in August 2024.   IPSS = 21 QOL = 4. PVR = 284 mL  PSA 7/24: 0.89  He was started on alfuzosin 10 mg daily in August 2024.  He returns today for follow-up.  He continues on alfuzosin. He initially noted some improvement with the medication but has had continued symptoms of hesitancy, weakened stream, and intermittent stream.  His urinary symptoms are intermittent in nature.  No dysuria or gross hematuria. IPSS = 21.  Portions of the above documentation were copied from a prior visit for review purposes only.   Past Medical History:  Past Medical History:  Diagnosis Date   Arthritis    Back pain    Chest pain    Chronic knee pain    Depression    Dyspnea    Elevated liver enzymes    Fatty liver    HLD (hyperlipidemia)    HTN (hypertension)    Joint pain    Leg edema    Osteoarthritis    Pre-diabetes    Vitamin D  deficiency     Past Surgical History:  Past Surgical History:  Procedure Laterality Date   KNEE ARTHROSCOPY Right    VASECTOMY      Allergies:  Allergies  Allergen Reactions   Ibuprofen Other (See Comments)    Bleeding   Penicillins     Family History:  Family History  Problem Relation Age of Onset   COPD Mother    Lung cancer Mother    Stroke Father    Coronary artery disease Father        carotid stenosis   Diabetes Father    Diabetes Paternal Grandfather    Colon cancer Neg Hx    Colon polyps Neg Hx    Esophageal cancer Neg Hx    Stomach cancer Neg Hx    Rectal cancer Neg Hx     Social History:  Social History   Tobacco Use   Smoking status: Former    Types: Cigarettes   Smokeless tobacco: Current    Types: Chew   Tobacco comments:    now vape no cigarettes  Vaping Use   Vaping status: Every Day   Substances: Nicotine  Substance Use Topics   Alcohol use: Not Currently    Comment: nothing in months 06/22/2018   Drug use: Never    ROS: Constitutional:  Negative for fever, chills, weight loss CV: Negative for chest pain, previous MI, hypertension Respiratory:  Negative for shortness of breath, wheezing, sleep apnea, frequent cough GI:  Negative for nausea, vomiting, bloody stool, GERD  Physical exam: BP 133/84   Pulse 73   Ht 5\' 8"  (1.727 m)   Wt 290 lb (131.5 kg)   BMI 44.09 kg/m  GENERAL APPEARANCE:  Well appearing, well developed, well nourished, NAD HEENT:  Atraumatic, normocephalic, oropharynx clear NECK:  Supple without lymphadenopathy or thyromegaly ABDOMEN:  Soft, non-tender, no masses EXTREMITIES:  Moves all extremities well, without clubbing, cyanosis, or edema NEUROLOGIC:  Alert and oriented x 3, normal gait, CN II-XII grossly intact MENTAL STATUS:  appropriate BACK:  Non-tender to palpation, No CVAT SKIN:  Warm, dry, and intact   Results: U/A: negative  PVR:  107 ml

## 2023-01-08 ENCOUNTER — Other Ambulatory Visit: Payer: Self-pay | Admitting: Family Medicine

## 2023-01-08 DIAGNOSIS — G8929 Other chronic pain: Secondary | ICD-10-CM

## 2023-01-08 MED ORDER — HYDROCODONE-ACETAMINOPHEN 5-325 MG PO TABS
1.0000 | ORAL_TABLET | Freq: Four times a day (QID) | ORAL | 0 refills | Status: DC | PRN
  Filled 2023-01-08: qty 45, 12d supply, fill #0

## 2023-01-08 NOTE — Telephone Encounter (Signed)
Requesting: NORCO Contract: 09/26/2022 UDS: 60/10/2022 Last OV: 12/26/2022 Next OV: 03/27/2023 Last Refill: 12/19/22, #45--0 RF Database:   Please advise

## 2023-01-09 ENCOUNTER — Other Ambulatory Visit (HOSPITAL_BASED_OUTPATIENT_CLINIC_OR_DEPARTMENT_OTHER): Payer: Self-pay

## 2023-01-27 ENCOUNTER — Other Ambulatory Visit: Payer: Self-pay | Admitting: Family

## 2023-01-27 ENCOUNTER — Other Ambulatory Visit: Payer: Self-pay

## 2023-01-27 DIAGNOSIS — G8929 Other chronic pain: Secondary | ICD-10-CM

## 2023-01-30 ENCOUNTER — Other Ambulatory Visit (HOSPITAL_BASED_OUTPATIENT_CLINIC_OR_DEPARTMENT_OTHER): Payer: Self-pay

## 2023-01-30 MED ORDER — HYDROCODONE-ACETAMINOPHEN 5-325 MG PO TABS
1.0000 | ORAL_TABLET | Freq: Four times a day (QID) | ORAL | 0 refills | Status: DC | PRN
  Filled 2023-01-30: qty 45, 12d supply, fill #0

## 2023-01-30 NOTE — Telephone Encounter (Signed)
Prescription Request  01/30/2023  Is this a "Controlled Substance" medicine?   LOV: Visit date not found  What is the name of the medication or equipment HYDROcodone-acetaminophen (NORCO/VICODIN) 5-325 MG tableT  Have you contacted your pharmacy to request a refill? Yes   Which pharmacy would you like this sent to?  [facility] [address], [address] [place] [address] Phone: [phone] Fax: [phone]   Patient notified that their request is being sent to the clinical staff for review and that they should receive a response within 2 business days.   Please advise at [facility] [phone]

## 2023-01-30 NOTE — Telephone Encounter (Signed)
Requesting: NORCO Contract: 09/26/2022 UDS: 09/26/2022 Last OV: 12/26/2022 Next OV: 03/27/2023 Last Refill: 01/08/23, #45--0 RF Database:   Please advise

## 2023-02-20 ENCOUNTER — Other Ambulatory Visit (HOSPITAL_BASED_OUTPATIENT_CLINIC_OR_DEPARTMENT_OTHER): Payer: Self-pay

## 2023-02-20 ENCOUNTER — Other Ambulatory Visit: Payer: Self-pay | Admitting: Family Medicine

## 2023-02-20 DIAGNOSIS — G8929 Other chronic pain: Secondary | ICD-10-CM

## 2023-02-20 MED ORDER — HYDROCODONE-ACETAMINOPHEN 5-325 MG PO TABS
1.0000 | ORAL_TABLET | Freq: Four times a day (QID) | ORAL | 0 refills | Status: DC | PRN
Start: 2023-02-20 — End: 2023-03-13
  Filled 2023-02-20: qty 45, 12d supply, fill #0

## 2023-02-20 NOTE — Telephone Encounter (Signed)
Requesting: hydrocodone 5-325mg   Contract: 04/17/22 UDS: 09/26/22 Last Visit: 12/26/22 Next Visit: 03/27/23 Last Refill: 01/30/23 #45 and 0RF   Please Advise

## 2023-03-11 ENCOUNTER — Other Ambulatory Visit: Payer: Self-pay | Admitting: Family Medicine

## 2023-03-11 DIAGNOSIS — G8929 Other chronic pain: Secondary | ICD-10-CM

## 2023-03-11 NOTE — Telephone Encounter (Signed)
Requesting: hydrocodone 5-325mg  Contract: 04/17/22 UDS: 09/26/22 Last Visit: 12/26/22 Next Visit: 03/27/23  Last Refill: 02/20/23 #45 and 0RF   Please Advise

## 2023-03-13 ENCOUNTER — Other Ambulatory Visit (HOSPITAL_BASED_OUTPATIENT_CLINIC_OR_DEPARTMENT_OTHER): Payer: Self-pay

## 2023-03-13 ENCOUNTER — Other Ambulatory Visit: Payer: Self-pay | Admitting: Family Medicine

## 2023-03-13 ENCOUNTER — Encounter (HOSPITAL_BASED_OUTPATIENT_CLINIC_OR_DEPARTMENT_OTHER): Payer: Self-pay

## 2023-03-13 DIAGNOSIS — G8929 Other chronic pain: Secondary | ICD-10-CM

## 2023-03-13 MED ORDER — HYDROCODONE-ACETAMINOPHEN 5-325 MG PO TABS
1.0000 | ORAL_TABLET | Freq: Four times a day (QID) | ORAL | 0 refills | Status: DC | PRN
  Filled 2023-03-13: qty 45, 12d supply, fill #0

## 2023-03-13 NOTE — Telephone Encounter (Signed)
Requesting: hydrocodone  Contract:04/17/22 UDS:09/26/22 Last Visit: 12/26/22 Next Visit: 03/27/23 Last Refill: 02/20/23 #45 and 0RF   Please Advise

## 2023-03-27 ENCOUNTER — Ambulatory Visit (INDEPENDENT_AMBULATORY_CARE_PROVIDER_SITE_OTHER): Payer: 59 | Admitting: Family Medicine

## 2023-03-27 ENCOUNTER — Other Ambulatory Visit (HOSPITAL_BASED_OUTPATIENT_CLINIC_OR_DEPARTMENT_OTHER): Payer: Self-pay

## 2023-03-27 ENCOUNTER — Encounter: Payer: Self-pay | Admitting: Family Medicine

## 2023-03-27 VITALS — BP 120/88 | HR 81 | Temp 98.5°F | Resp 18 | Ht 68.0 in | Wt 299.4 lb

## 2023-03-27 DIAGNOSIS — E785 Hyperlipidemia, unspecified: Secondary | ICD-10-CM | POA: Diagnosis not present

## 2023-03-27 DIAGNOSIS — R739 Hyperglycemia, unspecified: Secondary | ICD-10-CM | POA: Diagnosis not present

## 2023-03-27 DIAGNOSIS — M25561 Pain in right knee: Secondary | ICD-10-CM | POA: Diagnosis not present

## 2023-03-27 DIAGNOSIS — I10 Essential (primary) hypertension: Secondary | ICD-10-CM

## 2023-03-27 DIAGNOSIS — G8929 Other chronic pain: Secondary | ICD-10-CM

## 2023-03-27 DIAGNOSIS — M25562 Pain in left knee: Secondary | ICD-10-CM

## 2023-03-27 MED ORDER — HYDROCODONE-ACETAMINOPHEN 5-325 MG PO TABS
1.0000 | ORAL_TABLET | Freq: Four times a day (QID) | ORAL | 0 refills | Status: DC | PRN
  Filled 2023-03-27: qty 45, 12d supply, fill #0

## 2023-03-27 NOTE — Assessment & Plan Note (Signed)
D/w with pt medications , diet  Difficult to exercise due to joint issues

## 2023-03-27 NOTE — Assessment & Plan Note (Signed)
Well controlled, no changes to meds. Encouraged heart healthy diet such as the DASH diet and exercise as tolerated.  °

## 2023-03-27 NOTE — Assessment & Plan Note (Signed)
Encourage heart healthy diet such as MIND or DASH diet, increase exercise, avoid trans fats, simple carbohydrates and processed foods, consider a krill or fish or flaxseed oil cap daily.  °
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
## 2023-03-27 NOTE — Patient Instructions (Signed)

## 2023-03-28 LAB — CBC WITH DIFFERENTIAL/PLATELET
Absolute Lymphocytes: 1999 {cells}/uL (ref 850–3900)
Absolute Monocytes: 818 {cells}/uL (ref 200–950)
Basophils Absolute: 28 {cells}/uL (ref 0–200)
Basophils Relative: 0.5 %
Eosinophils Absolute: 73 {cells}/uL (ref 15–500)
Eosinophils Relative: 1.3 %
HCT: 43.7 % (ref 38.5–50.0)
Hemoglobin: 15.1 g/dL (ref 13.2–17.1)
MCH: 31 pg (ref 27.0–33.0)
MCHC: 34.6 g/dL (ref 32.0–36.0)
MCV: 89.7 fL (ref 80.0–100.0)
MPV: 10.6 fL (ref 7.5–12.5)
Monocytes Relative: 14.6 %
Neutro Abs: 2682 {cells}/uL (ref 1500–7800)
Neutrophils Relative %: 47.9 %
Platelets: 223 10*3/uL (ref 140–400)
RBC: 4.87 10*6/uL (ref 4.20–5.80)
RDW: 12.6 % (ref 11.0–15.0)
Total Lymphocyte: 35.7 %
WBC: 5.6 10*3/uL (ref 3.8–10.8)

## 2023-03-28 LAB — LIPID PANEL
Cholesterol: 222 mg/dL — ABNORMAL HIGH (ref <200)
HDL: 33 mg/dL — ABNORMAL LOW (ref >=40)
Non-HDL Cholesterol (Calc): 189 mg/dL — ABNORMAL HIGH (ref <130)
Total CHOL/HDL Ratio: 6.7 (calc) — ABNORMAL HIGH (ref <5.0)
Triglycerides: 598 mg/dL — ABNORMAL HIGH (ref <150)

## 2023-03-28 LAB — COMPREHENSIVE METABOLIC PANEL
AG Ratio: 1.4 (calc) (ref 1.0–2.5)
ALT: 61 U/L — ABNORMAL HIGH (ref 9–46)
AST: 31 U/L (ref 10–40)
Albumin: 4.4 g/dL (ref 3.6–5.1)
Alkaline phosphatase (APISO): 46 U/L (ref 36–130)
BUN: 12 mg/dL (ref 7–25)
CO2: 30 mmol/L (ref 20–32)
Calcium: 9.4 mg/dL (ref 8.6–10.3)
Chloride: 99 mmol/L (ref 98–110)
Creat: 1.06 mg/dL (ref 0.60–1.29)
Globulin: 3.1 g/dL (ref 1.9–3.7)
Glucose, Bld: 109 mg/dL — ABNORMAL HIGH (ref 65–99)
Potassium: 4.2 mmol/L (ref 3.5–5.3)
Sodium: 137 mmol/L (ref 135–146)
Total Bilirubin: 0.6 mg/dL (ref 0.2–1.2)
Total Protein: 7.5 g/dL (ref 6.1–8.1)

## 2023-03-28 LAB — HEMOGLOBIN A1C
Hgb A1c MFr Bld: 6.6 %{Hb} — ABNORMAL HIGH (ref <5.7)
Mean Plasma Glucose: 143 mg/dL
eAG (mmol/L): 7.9 mmol/L

## 2023-03-28 LAB — MICROALBUMIN / CREATININE URINE RATIO
Creatinine, Urine: 113 mg/dL (ref 20–320)
Microalb Creat Ratio: 3 mg/g{creat} (ref <30)
Microalb, Ur: 0.3 mg/dL

## 2023-03-29 ENCOUNTER — Other Ambulatory Visit: Payer: Self-pay | Admitting: Family Medicine

## 2023-03-29 DIAGNOSIS — E1065 Type 1 diabetes mellitus with hyperglycemia: Secondary | ICD-10-CM

## 2023-03-29 DIAGNOSIS — I10 Essential (primary) hypertension: Secondary | ICD-10-CM

## 2023-03-29 DIAGNOSIS — E785 Hyperlipidemia, unspecified: Secondary | ICD-10-CM

## 2023-03-29 MED ORDER — TIRZEPATIDE 2.5 MG/0.5ML ~~LOC~~ SOAJ
2.5000 mg | SUBCUTANEOUS | 0 refills | Status: DC
Start: 2023-03-29 — End: 2023-05-13
  Filled 2023-03-29: qty 2, 28d supply, fill #0

## 2023-03-30 ENCOUNTER — Other Ambulatory Visit (HOSPITAL_BASED_OUTPATIENT_CLINIC_OR_DEPARTMENT_OTHER): Payer: Self-pay

## 2023-03-30 ENCOUNTER — Telehealth: Payer: Self-pay

## 2023-03-30 NOTE — Telephone Encounter (Signed)
PA approved.   Request Reference Number: [id number]. MOUNJARO INJ 2.5/0.5 is approved through 03/29/2024. Your patient may now fill this prescription and it will be covered. Authorization Expiration Date: 03/29/2024

## 2023-03-30 NOTE — Telephone Encounter (Signed)
PA initiated via Covermymeds; KEY :[id number]. Awaiting determination.

## 2023-03-31 ENCOUNTER — Other Ambulatory Visit (HOSPITAL_BASED_OUTPATIENT_CLINIC_OR_DEPARTMENT_OTHER): Payer: Self-pay

## 2023-04-02 ENCOUNTER — Other Ambulatory Visit (HOSPITAL_BASED_OUTPATIENT_CLINIC_OR_DEPARTMENT_OTHER): Payer: Self-pay

## 2023-04-03 ENCOUNTER — Other Ambulatory Visit (HOSPITAL_BASED_OUTPATIENT_CLINIC_OR_DEPARTMENT_OTHER): Payer: Self-pay

## 2023-04-03 ENCOUNTER — Other Ambulatory Visit: Payer: Self-pay

## 2023-04-03 MED ORDER — ROSUVASTATIN CALCIUM 10 MG PO TABS
10.0000 mg | ORAL_TABLET | Freq: Every day | ORAL | 2 refills | Status: AC
  Filled 2023-04-03: qty 30, 30d supply, fill #0

## 2023-04-13 ENCOUNTER — Other Ambulatory Visit (HOSPITAL_BASED_OUTPATIENT_CLINIC_OR_DEPARTMENT_OTHER): Payer: Self-pay

## 2023-04-17 ENCOUNTER — Encounter: Payer: Self-pay | Admitting: Family Medicine

## 2023-04-17 ENCOUNTER — Other Ambulatory Visit: Payer: Self-pay | Admitting: Family Medicine

## 2023-04-17 ENCOUNTER — Other Ambulatory Visit (HOSPITAL_BASED_OUTPATIENT_CLINIC_OR_DEPARTMENT_OTHER): Payer: Self-pay

## 2023-04-17 DIAGNOSIS — G8929 Other chronic pain: Secondary | ICD-10-CM

## 2023-04-17 MED ORDER — HYDROCODONE-ACETAMINOPHEN 5-325 MG PO TABS
1.0000 | ORAL_TABLET | Freq: Four times a day (QID) | ORAL | 0 refills | Status: DC | PRN
  Filled 2023-04-17: qty 45, 12d supply, fill #0

## 2023-04-17 NOTE — Telephone Encounter (Signed)
Requesting: hydrocodone  Contract:04/17/22 UDS:09/26/22 Last Visit: 03/27/2023 Next Visit: 06/26/2022 Last Refill: 03/27/2023 #45 and 0RF    Please Advise

## 2023-04-27 NOTE — Telephone Encounter (Signed)
 Copied from CRM [id number]. Topic: Clinical - Medication Refill >> Apr 27, 2023  9:39 AM [name] wrote: Most Recent Primary Care Visit:  Provider: ANTONIO CYNDEE ROCKERS R  Department: [facility]  Visit Type: OFFICE VISIT  Date: 03/27/2023  Medication: tirzepatide  (MOUNJARO ) 2.5 MG/0.5ML Pen  Has the patient contacted their pharmacy? No, patient updated insurance on file to [employer] ID [id number], Requires Authorization.  (Agent: If no, request that the patient contact the pharmacy for the refill. If patient does not wish to contact the pharmacy document the reason why and proceed with request.) (Agent: If yes, when and what did the pharmacy advise?)  Is this the correct pharmacy for this prescription? Yes If no, delete pharmacy and type the correct one.  This is the patient's preferred pharmacy:   [facility] [address] [place] [address] Phone: [phone] Fax: [phone]    Has the prescription been filled recently? No  Is the patient out of the medication? Yes  Has the patient been seen for an appointment in the last year OR does the patient have an upcoming appointment? Yes  Can we respond through MyChart? Yes  Agent: Please be advised that Rx refills may take up to 3 business days. We ask that you follow-up with your pharmacy.

## 2023-05-07 ENCOUNTER — Other Ambulatory Visit (HOSPITAL_BASED_OUTPATIENT_CLINIC_OR_DEPARTMENT_OTHER): Payer: Self-pay

## 2023-05-07 ENCOUNTER — Other Ambulatory Visit: Payer: Self-pay | Admitting: Family Medicine

## 2023-05-07 DIAGNOSIS — G8929 Other chronic pain: Secondary | ICD-10-CM

## 2023-05-07 MED ORDER — HYDROCODONE-ACETAMINOPHEN 5-325 MG PO TABS
1.0000 | ORAL_TABLET | Freq: Four times a day (QID) | ORAL | 0 refills | Status: DC | PRN
  Filled 2023-05-07: qty 45, 12d supply, fill #0

## 2023-05-07 NOTE — Telephone Encounter (Signed)
Requesting: hydrocodone 5-325mg   Contract: 04/17/22 UDS: 09/26/22 Last Visit: 03/27/23 Next Visit: 06/26/23 Last Refill: 04/17/23 #45 and 0RF   Please Advise

## 2023-05-10 ENCOUNTER — Other Ambulatory Visit: Payer: Self-pay | Admitting: Family Medicine

## 2023-05-10 DIAGNOSIS — E1065 Type 1 diabetes mellitus with hyperglycemia: Secondary | ICD-10-CM

## 2023-05-13 ENCOUNTER — Other Ambulatory Visit (HOSPITAL_BASED_OUTPATIENT_CLINIC_OR_DEPARTMENT_OTHER): Payer: Self-pay

## 2023-05-13 ENCOUNTER — Other Ambulatory Visit: Payer: Self-pay

## 2023-05-13 MED ORDER — TIRZEPATIDE 5 MG/0.5ML ~~LOC~~ SOAJ
5.0000 mg | SUBCUTANEOUS | 0 refills | Status: AC
  Filled 2023-05-13: qty 2, 28d supply, fill #0

## 2023-05-20 ENCOUNTER — Other Ambulatory Visit (HOSPITAL_COMMUNITY): Payer: Self-pay

## 2023-05-22 ENCOUNTER — Other Ambulatory Visit (HOSPITAL_BASED_OUTPATIENT_CLINIC_OR_DEPARTMENT_OTHER): Payer: Self-pay

## 2023-05-22 ENCOUNTER — Other Ambulatory Visit: Payer: Self-pay | Admitting: Family Medicine

## 2023-05-22 DIAGNOSIS — G8929 Other chronic pain: Secondary | ICD-10-CM

## 2023-05-28 ENCOUNTER — Other Ambulatory Visit (HOSPITAL_BASED_OUTPATIENT_CLINIC_OR_DEPARTMENT_OTHER): Payer: Self-pay

## 2023-05-28 ENCOUNTER — Other Ambulatory Visit: Payer: Self-pay | Admitting: Family Medicine

## 2023-05-28 DIAGNOSIS — G8929 Other chronic pain: Secondary | ICD-10-CM

## 2023-05-28 MED ORDER — HYDROCODONE-ACETAMINOPHEN 5-325 MG PO TABS
1.0000 | ORAL_TABLET | Freq: Four times a day (QID) | ORAL | 0 refills | Status: AC | PRN
  Filled 2023-05-28: qty 45, 12d supply, fill #0

## 2023-05-28 NOTE — Telephone Encounter (Signed)
 Requesting: Norco 5-325 Contract: 03/21/2022 UDS: 09/26/2022 Last Visit: 03/27/2023 Next Visit: 06/26/2023 Last Refill: 05/07/2023  Please Advise

## 2023-06-19 ENCOUNTER — Telehealth: Payer: Self-pay | Admitting: Family Medicine

## 2023-06-19 NOTE — Telephone Encounter (Signed)
 FYI

## 2023-06-19 NOTE — Telephone Encounter (Signed)
 Copied from CRM [id number]. Topic: General - Other >> Jun 19, 2023  8:41 AM Theodis Sato wrote: Reason for CRM: Patient calling to inform clinic he is now moving all of his care to the [facility] and will no longer be a patient with [facility].

## 2023-06-26 ENCOUNTER — Ambulatory Visit: Payer: 59 | Admitting: Family Medicine

## 2023-08-06 ENCOUNTER — Other Ambulatory Visit: Payer: Self-pay | Admitting: Family Medicine

## 2023-08-06 DIAGNOSIS — F321 Major depressive disorder, single episode, moderate: Secondary | ICD-10-CM
